# Patient Record
Sex: Female | Born: 1970 | Race: Black or African American | Hispanic: No | Smoking: Never smoker
Health system: Southern US, Community
[De-identification: ages and names within clinical notes are randomized; demographics above are authoritative.]

## PROBLEM LIST (undated history)

## (undated) DIAGNOSIS — N939 Abnormal uterine and vaginal bleeding, unspecified: Secondary | ICD-10-CM

## (undated) DIAGNOSIS — N920 Excessive and frequent menstruation with regular cycle: Secondary | ICD-10-CM

## (undated) DIAGNOSIS — T7840XA Allergy, unspecified, initial encounter: Secondary | ICD-10-CM

## (undated) DIAGNOSIS — I10 Essential (primary) hypertension: Secondary | ICD-10-CM

## (undated) DIAGNOSIS — F419 Anxiety disorder, unspecified: Secondary | ICD-10-CM

## (undated) HISTORY — DX: Anxiety disorder, unspecified: F41.9

## (undated) HISTORY — PX: TUBAL LIGATION: SHX77

## (undated) HISTORY — PX: BUNIONECTOMY: SHX129

## (undated) HISTORY — DX: Allergy, unspecified, initial encounter: T78.40XA

## (undated) HISTORY — PX: COSMETIC SURGERY: SHX468

---

## 2003-07-24 ENCOUNTER — Emergency Department (HOSPITAL_COMMUNITY): Admission: EM | Admit: 2003-07-24 | Discharge: 2003-07-24 | Payer: Self-pay | Admitting: Emergency Medicine

## 2003-11-14 ENCOUNTER — Encounter: Admission: RE | Admit: 2003-11-14 | Discharge: 2003-12-26 | Payer: Self-pay

## 2014-03-21 ENCOUNTER — Emergency Department (HOSPITAL_COMMUNITY): Payer: Medicaid Other

## 2014-03-21 ENCOUNTER — Emergency Department (HOSPITAL_COMMUNITY)
Admission: EM | Admit: 2014-03-21 | Discharge: 2014-03-21 | Disposition: A | Discharge status: Home / Self Care | Payer: Medicaid Other | Attending: Emergency Medicine | Admitting: Emergency Medicine

## 2014-03-21 ENCOUNTER — Encounter (HOSPITAL_COMMUNITY): Payer: Self-pay | Admitting: Emergency Medicine

## 2014-03-21 DIAGNOSIS — Y9289 Other specified places as the place of occurrence of the external cause: Secondary | ICD-10-CM | POA: Diagnosis not present

## 2014-03-21 DIAGNOSIS — Y9389 Activity, other specified: Secondary | ICD-10-CM | POA: Diagnosis not present

## 2014-03-21 DIAGNOSIS — W01198A Fall on same level from slipping, tripping and stumbling with subsequent striking against other object, initial encounter: Secondary | ICD-10-CM | POA: Insufficient documentation

## 2014-03-21 DIAGNOSIS — W108XXA Fall (on) (from) other stairs and steps, initial encounter: Secondary | ICD-10-CM | POA: Insufficient documentation

## 2014-03-21 DIAGNOSIS — Y998 Other external cause status: Secondary | ICD-10-CM | POA: Insufficient documentation

## 2014-03-21 DIAGNOSIS — I10 Essential (primary) hypertension: Secondary | ICD-10-CM | POA: Insufficient documentation

## 2014-03-21 DIAGNOSIS — Z79899 Other long term (current) drug therapy: Secondary | ICD-10-CM | POA: Insufficient documentation

## 2014-03-21 DIAGNOSIS — S8992XA Unspecified injury of left lower leg, initial encounter: Secondary | ICD-10-CM | POA: Diagnosis not present

## 2014-03-21 DIAGNOSIS — W19XXXA Unspecified fall, initial encounter: Secondary | ICD-10-CM

## 2014-03-21 DIAGNOSIS — M25562 Pain in left knee: Secondary | ICD-10-CM

## 2014-03-21 HISTORY — DX: Essential (primary) hypertension: I10

## 2014-03-21 MED ORDER — HYDROCODONE-ACETAMINOPHEN 5-325 MG PO TABS: 1.0000 | ORAL_TABLET | ORAL | Status: DC | PRN

## 2014-03-21 NOTE — Discharge Instructions (Signed)

## 2014-03-21 NOTE — ED Notes (Signed)
Pt stated that she fell and struck her l/knee one week ago. Pain has increased over last few days. Pt is ambulatory. Denies LOC, injury due to slip and fall

## 2014-03-21 NOTE — ED Provider Notes (Signed)
CSN: [id number]     Arrival date & time 03/21/14  1414 History  This chart was scribed for non-physician practitioner ,Everlene Farrier, PA-C,  working with Geoffery Lyons, MD, by Lionel December, ED Scribe. This patient was seen in room WTR8/WTR8 and the patient's care was started at 3:11 PM .   Chief Complaint  Patient presents with  . Knee Pain    fell and struck knee one week ago  . Fall     (Consider location/radiation/quality/duration/timing/severity/associated sxs/prior Treatment) The history is provided by the patient. No language interpreter was used.    HPI Comments: Cathy Cole is a 44 y.o. female who presents to the Emergency Department complaining of knee pain that occurred after slipping down two stairs and falling a week ago. She notes that the pain started two days after the fall and states that it feels swollen. Patient is ambulatory and  notes that the pain is worse with movement (8/10)  and is a 5/10 without movement.   She has tried heating pad, and tylenol.  She went to her doctor at [facility] and states that he gave her Tramadol (which did not alleviate her pain) and no xray was not taken. She would like an x-ray today. Denies LOC, numbness, tingling, previous knee injury or surgery, back pain, calve pain.    Past Medical History  Diagnosis Date  . Hypertension    Past Surgical History  Procedure Laterality Date  . Tubal ligation    . Bunionectomy     Family History  Problem Relation Age of Onset  . Diabetes Other   . Hypertension Other    History  Substance Use Topics  . Smoking status: Never Smoker   . Smokeless tobacco: Not on file  . Alcohol Use: No   OB History    No data available     Review of Systems  Constitutional: Negative for fever.  Eyes: Negative for pain.  Respiratory: Negative for cough and shortness of breath.   Cardiovascular: Negative for chest pain and leg swelling.  Musculoskeletal: Negative for myalgias, back  pain, joint swelling and neck stiffness.       Left knee pain  Neurological: Negative for syncope and numbness.  All other systems reviewed and are negative.     Allergies  Bactrim  Home Medications   Prior to Admission medications   Medication Sig Start Date End Date Taking? Authorizing Provider  hydrochlorothiazide (HYDRODIURIL) 25 MG tablet Take 25 mg by mouth daily.   Yes Historical Provider, MD  metoprolol tartrate (LOPRESSOR) 25 MG tablet Take 25 mg by mouth 2 (two) times daily.   Yes Historical Provider, MD  HYDROcodone-acetaminophen (NORCO/VICODIN) 5-325 MG per tablet Take 1 tablet by mouth every 4 (four) hours as needed for moderate pain or severe pain. 03/21/14   Einar Gip Taydon Nasworthy, PA-C   BP 132/79 mmHg  Pulse 88  Temp(Src) 98.5 F (36.9 C) (Oral)  Resp 16  SpO2 100%  LMP 02/26/2014 (Approximate) Physical Exam  Constitutional: She appears well-developed and well-nourished. No distress.  HENT:  Head: Normocephalic and atraumatic.  Mouth/Throat: Oropharynx is clear and moist. No oropharyngeal exudate.  Eyes: Pupils are equal, round, and reactive to light. Right eye exhibits no discharge. Left eye exhibits no discharge.  Cardiovascular: Normal rate, regular rhythm, normal heart sounds and intact distal pulses.  Exam reveals no gallop.   No murmur heard. Bilateral posterior tibialis pulses are intact.  Pulmonary/Chest: Effort normal. No respiratory distress.  Musculoskeletal: Normal  range of motion. She exhibits tenderness. She exhibits no edema.  Patient's left knee is tender to palpation on the anterior aspect. There is no knee edema noted. There is no knee instability noted. Patient is full range of motion of her knee. The patient's family in the room without difficulty or assistance. The patient has good strength in her bilateral lower extremities.  Neurological: She is alert. Coordination normal.  Skin: Skin is warm and dry. No rash noted. She is not diaphoretic.  No erythema. No pallor.  Psychiatric: She has a normal mood and affect. Her behavior is normal.  Nursing note and vitals reviewed.   ED Course  Procedures (including critical care time) DIAGNOSTIC STUDIES: Oxygen Saturation is 100% on RA, normal by my interpretation.    COORDINATION OF CARE: 3:17 PM Discussed treatment plan with patient at beside, the patient agrees with the plan and has no further questions at this time.  Labs Review Labs Reviewed - No data to display  Imaging Review Dg Knee Complete 4 Views Left  03/21/2014   CLINICAL DATA:  Larey SeatFell 8 days ago and injured knee.  Persistent pain.  EXAM: LEFT KNEE - COMPLETE 4+ VIEW  COMPARISON:  None.  FINDINGS: Mild tricompartmental degenerative changes for age with early joint space narrowing and minimal osteophytic spurring. No acute fracture or osteochondral lesion. There is a small to moderate joint effusion.  IMPRESSION: Mild age advanced degenerative changes.  No acute fracture.  Small to moderate joint effusion.   Electronically Signed   By: Loralie ChampagneMark  Gallerani M.D.   On: 03/21/2014 16:04     EKG Interpretation None      Filed Vitals:   03/21/14 1435 03/21/14 1635  BP: 132/79   Pulse: 88 80  Temp: 98.5 F (36.9 C)   TempSrc: Oral   Resp: 16   SpO2: 100% 100%     MDM   Meds given in ED:  Medications - No data to display  Discharge Medication List as of 03/21/2014  4:21 PM    START taking these medications   Details  HYDROcodone-acetaminophen (NORCO/VICODIN) 5-325 MG per tablet Take 1 tablet by mouth every 4 (four) hours as needed for moderate pain or severe pain., Starting 03/21/2014, Until Discontinued, Print        Final diagnoses:  Fall  Left knee pain   This is a 44 year old female who presents to the emergency department after a slip and fall injuring her left knee 1 week ago. The patient reports she had pain starting 5 days ago that is generalized to her knee. Patient has full range of motion of her left  knee. The patient is afebrile and nontoxic-appearing. The patient's knee x-ray is unremarkable. Patient is provided a knee sleeve and the ED. I advised the patient to follow-up with their primary care provider this week. I advised the patient to return to the emergency department with new or worsening symptoms or new concerns. The patient verbalized understanding and agreement with plan.    I personally performed the services described in this documentation, which was scribed in my presence. The recorded information has been reviewed and is accurate.  Lawana ChambersWilliam Duncan Saralynn Langhorst, PA-C 03/21/14 2244  Geoffery Lyonsouglas Delo, MD 03/22/14 [phone]

## 2014-07-01 ENCOUNTER — Encounter (HOSPITAL_COMMUNITY): Payer: Self-pay | Admitting: Emergency Medicine

## 2014-07-01 ENCOUNTER — Emergency Department (HOSPITAL_COMMUNITY): Payer: Medicaid Other

## 2014-07-01 ENCOUNTER — Emergency Department (HOSPITAL_COMMUNITY)
Admission: EM | Admit: 2014-07-01 | Discharge: 2014-07-01 | Disposition: A | Discharge status: Home / Self Care | Payer: Medicaid Other | Attending: Emergency Medicine | Admitting: Emergency Medicine

## 2014-07-01 DIAGNOSIS — Z79899 Other long term (current) drug therapy: Secondary | ICD-10-CM | POA: Insufficient documentation

## 2014-07-01 DIAGNOSIS — J111 Influenza due to unidentified influenza virus with other respiratory manifestations: Secondary | ICD-10-CM | POA: Diagnosis not present

## 2014-07-01 DIAGNOSIS — Z3202 Encounter for pregnancy test, result negative: Secondary | ICD-10-CM | POA: Insufficient documentation

## 2014-07-01 DIAGNOSIS — I10 Essential (primary) hypertension: Secondary | ICD-10-CM | POA: Insufficient documentation

## 2014-07-01 DIAGNOSIS — R69 Illness, unspecified: Secondary | ICD-10-CM

## 2014-07-01 DIAGNOSIS — R509 Fever, unspecified: Secondary | ICD-10-CM | POA: Diagnosis present

## 2014-07-01 LAB — WET PREP, GENITAL
CLUE CELLS WET PREP: NONE SEEN
TRICH WET PREP: NONE SEEN
WBC WET PREP: NONE SEEN
Yeast Wet Prep HPF POC: NONE SEEN

## 2014-07-01 LAB — URINALYSIS, ROUTINE W REFLEX MICROSCOPIC
Glucose, UA: NEGATIVE mg/dL
Ketones, ur: NEGATIVE mg/dL
Nitrite: NEGATIVE
Protein, ur: NEGATIVE mg/dL
Specific Gravity, Urine: 1.029 (ref 1.005–1.030)
Urobilinogen, UA: 0.2 mg/dL (ref 0.0–1.0)
pH: 6 (ref 5.0–8.0)

## 2014-07-01 LAB — URINE MICROSCOPIC-ADD ON

## 2014-07-01 LAB — PREGNANCY, URINE: Preg Test, Ur: NEGATIVE

## 2014-07-01 MED ORDER — IBUPROFEN 800 MG PO TABS
800.0000 mg | ORAL_TABLET | Freq: Once | ORAL | Status: AC
  Administered 2014-07-01: 800 mg via ORAL
  Filled 2014-07-01: qty 1

## 2014-07-01 NOTE — Discharge Instructions (Signed)
Viral Infections She presented hydrated, use Tylenol or Motrin as needed for aches and fevers. Follow-up with her doctor. Return to the ED if you develop new or worsening symptoms. A viral infection can be caused by different types of viruses.Most viral infections are not serious and resolve on their own. However, some infections may cause severe symptoms and may lead to further complications. SYMPTOMS Viruses can frequently cause:  Minor sore throat.  Aches and pains.  Headaches.  Runny nose.  Different types of rashes.  Watery eyes.  Tiredness.  Cough.  Loss of appetite.  Gastrointestinal infections, resulting in nausea, vomiting, and diarrhea. These symptoms do not respond to antibiotics because the infection is not caused by bacteria. However, you might catch a bacterial infection following the viral infection. This is sometimes called a "superinfection." Symptoms of such a bacterial infection may include:  Worsening sore throat with pus and difficulty swallowing.  Swollen neck glands.  Chills and a high or persistent fever.  Severe headache.  Tenderness over the sinuses.  Persistent overall ill feeling (malaise), muscle aches, and tiredness (fatigue).  Persistent cough.  Yellow, green, or brown mucus production with coughing. HOME CARE INSTRUCTIONS   Only take over-the-counter or prescription medicines for pain, discomfort, diarrhea, or fever as directed by your caregiver.  Drink enough water and fluids to keep your urine clear or pale yellow. Sports drinks can provide valuable electrolytes, sugars, and hydration.  Get plenty of rest and maintain proper nutrition. Soups and broths with crackers or rice are fine. SEEK IMMEDIATE MEDICAL CARE IF:   You have severe headaches, shortness of breath, chest pain, neck pain, or an unusual rash.  You have uncontrolled vomiting, diarrhea, or you are unable to keep down fluids.  You or your child has an oral  temperature above 102 F (38.9 C), not controlled by medicine.  Your baby is older than 3 months with a rectal temperature of 102 F (38.9 C) or higher.  Your baby is 113 months old or younger with a rectal temperature of 100.4 F (38 C) or higher. MAKE SURE YOU:   Understand these instructions.  Will watch your condition.  Will get help right away if you are not doing well or get worse. Document Released: 12/11/2004 Document Revised: 05/26/2011 Document Reviewed: 07/08/2010 [facility] Patient Information 2015 [place]. This information is not intended to replace advice given to you by your health care provider. Make sure you discuss any questions you have with your health care provider.

## 2014-07-01 NOTE — ED Notes (Signed)
Pt from home c/o cough, body aches, fever, and cloudy urine since yesterday.

## 2014-07-01 NOTE — ED Notes (Signed)
Pt is requesting a pelvic. Pt is concerned about discharge. Notified Dr. Manus Gunningancour

## 2014-07-01 NOTE — ED Notes (Signed)
Provided pt with Cranberry Juice for [facility]

## 2014-07-01 NOTE — ED Provider Notes (Signed)
CSN: [id number]     Arrival date & time 07/01/14  1141 History   First MD Initiated Contact with Patient 07/01/14 1207     Chief Complaint  Patient presents with  . Generalized Body Aches  . Fever  . Cough     (Consider location/radiation/quality/duration/timing/severity/associated sxs/prior Treatment) HPI Comments: Patient presents with 2 day history of body aches, cough, fever, back pain, dark urine, chills and fever to 101. 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 She did not receive a flu shot. She denies any shortness of breath. She has a nonproductive cough in her chest hurts when she coughs only. Denies any leg pain or leg swelling. No abdominal pain, nausea or vomiting. She denies any pain with urination but her urine has been dark. She denies any focal weakness, numbness or tingling. States compliance with her blood pressure medications.  Patient is a 44 y.o. female presenting with cough. The history is provided by the patient.  Cough Associated symptoms: fever and myalgias   Associated symptoms: no chest pain and no headaches     Past Medical History  Diagnosis Date  . Hypertension    Past Surgical History  Procedure Laterality Date  . Tubal ligation    . Bunionectomy     Family History  Problem Relation Age of Onset  . Diabetes Other   . Hypertension Other    History  Substance Use Topics  . Smoking status: Never Smoker   . Smokeless tobacco: Not on file  . Alcohol Use: No   OB History    No data available     Review of Systems  Constitutional: Positive for fever, activity change, appetite change and fatigue.  HENT: Positive for congestion. Negative for postnasal drip.   Eyes: Negative for visual disturbance.  Respiratory: Positive for cough.   Cardiovascular: Negative for chest pain.  Gastrointestinal: Negative for nausea, vomiting and abdominal pain.  Genitourinary: Negative for dysuria and hematuria.  Musculoskeletal: Positive for myalgias and  arthralgias.  Neurological: Positive for weakness. Negative for dizziness, light-headedness and headaches.  A complete 10 system review of systems was obtained and all systems are negative except as noted in the HPI and PMH.      Allergies  Bactrim  Home Medications   Prior to Admission medications   Medication Sig Start Date End Date Taking? Authorizing Provider  hydrochlorothiazide (MICROZIDE) 12.5 MG capsule Take 12.5 mg by mouth daily.   Yes Historical Provider, MD  metoprolol succinate (TOPROL-XL) 25 MG 24 hr tablet Take 25 mg by mouth daily.   Yes Historical Provider, MD  HYDROcodone-acetaminophen (NORCO/VICODIN) 5-325 MG per tablet Take 1 tablet by mouth every 4 (four) hours as needed for moderate pain or severe pain. 03/21/14   Everlene FarrierWilliam Dansie, PA-C   BP 116/83 mmHg  Pulse 92  Temp(Src) 99 F (37.2 C) (Oral)  Resp 18  SpO2 96%  LMP 06/15/2014 Physical Exam  Constitutional: She is oriented to person, place, and time. She appears well-developed and well-nourished. No distress.  HENT:  Head: Normocephalic and atraumatic.  Mouth/Throat: Oropharynx is clear and moist. No oropharyngeal exudate.  Eyes: Conjunctivae and EOM are normal. Pupils are equal, round, and reactive to light.  Neck: Normal range of motion. Neck supple.  No meningismus.  Cardiovascular: Normal rate, regular rhythm, normal heart sounds and intact distal pulses.   No murmur heard. Pulmonary/Chest: Effort normal and breath sounds normal. No respiratory distress. She exhibits no tenderness.  Abdominal: Soft. There is no tenderness. There  is no rebound and no guarding.  Genitourinary: Vaginal discharge found.  Chaperone present. White discharge in vaginal vault. No CMT. No adnexal tenderness.  Musculoskeletal: Normal range of motion. She exhibits tenderness. She exhibits no edema.  Paraspinal lumbar tenderness  Neurological: She is alert and oriented to person, place, and time. No cranial nerve deficit. She  exhibits normal muscle tone. Coordination normal.  No ataxia on finger to nose bilaterally. No pronator drift. 5/5 strength throughout. CN 2-12 intact. Negative Romberg. Equal grip strength. Sensation intact. Gait is normal.   Skin: Skin is warm.  Psychiatric: She has a normal mood and affect. Her behavior is normal.  Nursing note and vitals reviewed.   ED Course  Procedures (including critical care time) Labs Review Labs Reviewed  URINALYSIS, ROUTINE W REFLEX MICROSCOPIC - Abnormal; Notable for the following:    APPearance CLOUDY (*)    Hgb urine dipstick MODERATE (*)    Bilirubin Urine SMALL (*)    Leukocytes, UA TRACE (*)    All other components within normal limits  URINE MICROSCOPIC-ADD ON - Abnormal; Notable for the following:    Squamous Epithelial / LPF MANY (*)    Bacteria, UA FEW (*)    All other components within normal limits  WET PREP, GENITAL  PREGNANCY, URINE  GC/CHLAMYDIA PROBE AMP ([place])    Imaging Review Dg Chest 2 View  07/01/2014   CLINICAL DATA:  Generalized body aches, fever, cough, and cloudy urine x last p.m. Nonsmoker. Nondiabetic. H/o HTN.  EXAM: CHEST  2 VIEW  COMPARISON:  None.  FINDINGS: The heart size and mediastinal contours are within normal limits. Both lungs are clear. No pleural effusion or pneumothorax. The visualized skeletal structures are unremarkable.  IMPRESSION: No active cardiopulmonary disease.   Electronically Signed   By: Amie Portland M.D.   On: 07/01/2014 12:55     EKG Interpretation   Date/Time:  Saturday July 01 2014 12:30:14 EDT Ventricular Rate:  93 PR Interval:  161 QRS Duration: 75 QT Interval:  343 QTC Calculation: 427 R Axis:   37 Text Interpretation:  Sinus rhythm No previous ECGs available Confirmed by  Malyna Budney  MD, Kimra Kantor [phone]) on 07/01/2014 1:09:27 PM      MDM   Final diagnoses:  Influenza-like illness   2 days of body aches, fever, coughing, headache, chills with sick contacts at work.  Chest  x-ray negative. Urinalysis appears contaminated.  Patient states she's been having ongoing vaginal discharge for the past week and wants a pelvic exam. She denies any bleeding or pain. She was treated for a yeast infection by her doctor earlier this week.  Pelvic exam done as above.  Benign.  Supportive care for viral syndrome, possibly influenza. Follow-up with PCP this week. Return precautions discussed.  Glynn Octave, MD 07/01/14 [phone]

## 2014-07-03 LAB — GC/CHLAMYDIA PROBE AMP (~~LOC~~) NOT AT ARMC
Chlamydia: NEGATIVE
Neisseria Gonorrhea: NEGATIVE

## 2014-10-11 ENCOUNTER — Ambulatory Visit: Admit: 2014-10-11 | Payer: Self-pay | Admitting: Obstetrics and Gynecology

## 2014-10-11 SURGERY — NOVASURE ABLATION
Anesthesia: General

## 2014-10-25 ENCOUNTER — Ambulatory Visit: Payer: Medicaid Other | Attending: Internal Medicine

## 2014-11-01 ENCOUNTER — Encounter (HOSPITAL_COMMUNITY): Payer: Self-pay

## 2014-11-01 ENCOUNTER — Encounter (HOSPITAL_COMMUNITY)
Admission: RE | Admit: 2014-11-01 | Discharge: 2014-11-01 | Disposition: A | Discharge status: Home / Self Care | Payer: Medicaid Other | Source: Ambulatory Visit | Attending: Obstetrics and Gynecology | Admitting: Obstetrics and Gynecology

## 2014-11-01 DIAGNOSIS — Z882 Allergy status to sulfonamides status: Secondary | ICD-10-CM | POA: Diagnosis not present

## 2014-11-01 DIAGNOSIS — Z8249 Family history of ischemic heart disease and other diseases of the circulatory system: Secondary | ICD-10-CM | POA: Diagnosis not present

## 2014-11-01 DIAGNOSIS — I10 Essential (primary) hypertension: Secondary | ICD-10-CM | POA: Diagnosis not present

## 2014-11-01 DIAGNOSIS — Z01818 Encounter for other preprocedural examination: Secondary | ICD-10-CM | POA: Diagnosis present

## 2014-11-01 DIAGNOSIS — E669 Obesity, unspecified: Secondary | ICD-10-CM | POA: Diagnosis not present

## 2014-11-01 DIAGNOSIS — N939 Abnormal uterine and vaginal bleeding, unspecified: Secondary | ICD-10-CM | POA: Diagnosis not present

## 2014-11-01 DIAGNOSIS — N920 Excessive and frequent menstruation with regular cycle: Secondary | ICD-10-CM | POA: Diagnosis not present

## 2014-11-01 DIAGNOSIS — Z79899 Other long term (current) drug therapy: Secondary | ICD-10-CM | POA: Diagnosis not present

## 2014-11-01 DIAGNOSIS — Z833 Family history of diabetes mellitus: Secondary | ICD-10-CM | POA: Diagnosis not present

## 2014-11-01 LAB — CBC
HEMATOCRIT: 39.5 % (ref 36.0–46.0)
HEMOGLOBIN: 13.6 g/dL (ref 12.0–15.0)
MCH: 30.6 pg (ref 26.0–34.0)
MCHC: 34.4 g/dL (ref 30.0–36.0)
MCV: 88.8 fL (ref 78.0–100.0)
Platelets: 286 10*3/uL (ref 150–400)
RBC: 4.45 MIL/uL (ref 3.87–5.11)
RDW: 13.2 % (ref 11.5–15.5)
WBC: 10.1 10*3/uL (ref 4.0–10.5)

## 2014-11-01 LAB — BASIC METABOLIC PANEL
ANION GAP: 8 (ref 5–15)
BUN: 13 mg/dL (ref 6–20)
CALCIUM: 8.7 mg/dL — AB (ref 8.9–10.3)
CHLORIDE: 104 mmol/L (ref 101–111)
CO2: 24 mmol/L (ref 22–32)
Creatinine, Ser: 0.88 mg/dL (ref 0.44–1.00)
GFR calc non Af Amer: >60 mL/min (ref >60)
Glucose, Bld: 106 mg/dL — ABNORMAL HIGH (ref 65–99)
POTASSIUM: 4.9 mmol/L (ref 3.5–5.1)
Sodium: 136 mmol/L (ref 135–145)

## 2014-11-01 NOTE — Patient Instructions (Signed)
   Your procedure is scheduled on: AUG 24 AT 730AM  Enter through the Main Entrance of [facility] at: 6AM  Pick up the phone at the desk and dial [phone] and inform us of your arrival.  Please call this number if you have any problems the morning of surgery: [phone]  Remember: Do not eat food or drink liquids  after midnight: AUG 23  Take these medicines the morning of surgery with a SIP OF WATER: TAKE BOTH BLOOD PRESSURE MEDS AM OF SURGERY  Do not wear jewelry, make-up, or FINGER nail polish No metal in your hair or on your body. Do not wear lotions, powders, perfumes.  You may wear deodorant.  Do not bring valuables to the hospital. Contacts, dentures or bridgework may not be worn into surgery.     Patients discharged on the day of surgery will not be allowed to drive home.

## 2014-11-01 NOTE — Anesthesia Preprocedure Evaluation (Addendum)
Anesthesia Evaluation  Patient identified by MRN, date of birth, ID band Patient awake    Reviewed: Allergy & Precautions, NPO status , Patient's Chart, lab work & pertinent test results  Airway Mallampati: II   Neck ROM: Full    Dental  (+) Dental Advisory Given, Teeth Intact   Pulmonary neg pulmonary ROS,  breath sounds clear to auscultation        Cardiovascular hypertension, Pt. on medications Rhythm:Regular  EKG OK   Neuro/Psych negative neurological ROS     GI/Hepatic negative GI ROS, Neg liver ROS,   Endo/Other  negative endocrine ROS  Renal/GU negative Renal ROS     Musculoskeletal   Abdominal (+) - obese,   Peds  Hematology 13/39   Anesthesia Other Findings   Reproductive/Obstetrics                            Anesthesia Physical Anesthesia Plan  ASA: II  Anesthesia Plan: General   Post-op Pain Management:    Induction: Intravenous  Airway Management Planned: LMA  Additional Equipment:   Intra-op Plan:   Post-operative Plan:   Informed Consent: I have reviewed the patients History and Physical, chart, labs and discussed the procedure including the risks, benefits and alternatives for the proposed anesthesia with the patient or authorized representative who has indicated his/her understanding and acceptance.     Plan Discussed with:   Anesthesia Plan Comments:         Anesthesia Quick Evaluation

## 2014-11-07 ENCOUNTER — Encounter (HOSPITAL_COMMUNITY): Payer: Self-pay | Admitting: Obstetrics and Gynecology

## 2014-11-07 DIAGNOSIS — N939 Abnormal uterine and vaginal bleeding, unspecified: Secondary | ICD-10-CM | POA: Diagnosis present

## 2014-11-07 DIAGNOSIS — N920 Excessive and frequent menstruation with regular cycle: Secondary | ICD-10-CM

## 2014-11-07 HISTORY — DX: Excessive and frequent menstruation with regular cycle: N92.0

## 2014-11-07 HISTORY — DX: Abnormal uterine and vaginal bleeding, unspecified: N93.9

## 2014-11-07 NOTE — H&P (Signed)
Cathy Cole is an 44 y.o. female [phone] with AUB, menorrhagia.  For novasure endometrial ablation.  Had negative SIUS and [place].  US reveals possible adenomyosis.  D/W pt r/b/a and process of ablation     Past Medical History  Diagnosis Date  . Hypertension   . Abnormal uterine bleeding (AUB) 11/07/2014  . Menorrhagia 11/07/2014    Past Surgical History  Procedure Laterality Date  . Tubal ligation    . Bunionectomy      Family History  Problem Relation Age of Onset  . Diabetes Other   . Hypertension Other     Social History:  reports that she has never smoked. She does not have any smokeless tobacco history on file. She reports that she does not drink alcohol. Her drug history is not on file.single  Allergies:  Allergies  Allergen Reactions  . Bactrim [Sulfamethoxazole-Trimethoprim] Anaphylaxis    Chest pain    Meds HCTZ, metoprolol/ lorazepam, Vit D   POBGyn Hx [id number] SVD x 2, TAB, fetal demise, SVD No abn pap, 4/15, last Contra BTL No STDS  Review of Systems  Constitutional: Negative.   HENT: Negative.   Eyes: Negative.   Respiratory: Negative.   Cardiovascular: Negative.   Gastrointestinal: Negative.   Genitourinary: Negative.   Musculoskeletal: Negative.   Skin: Negative.   Neurological: Negative.   Psychiatric/Behavioral: Negative.     There were no vitals taken for this visit. Physical Exam  Constitutional: She is oriented to person, place, and time. She appears well-developed and well-nourished.  HENT:  Head: Normocephalic and atraumatic.  Cardiovascular: Normal rate and regular rhythm.   Respiratory: Effort normal and breath sounds normal. No respiratory distress. She has no wheezes.  GI: Soft. Bowel sounds are normal. She exhibits no distension. There is no tenderness.  Musculoskeletal: Normal range of motion.  Neurological: She is alert and oriented to person, place, and time.  Skin: Skin is warm and dry.  Psychiatric: She has a normal mood  and affect. Her behavior is normal.    No results found for this or any previous visit (from the past 24 hour(s)).  No results found.  Assessment/Plan: 44yo for Novasure endometrial ablation Ablation to control bleeding D/w pt r/b/a   Bovard-Stuckert, Jacqulene Huntley 11/07/2014, 10:11 PM

## 2014-11-08 ENCOUNTER — Ambulatory Visit (HOSPITAL_COMMUNITY): Payer: Medicaid Other | Admitting: Anesthesiology

## 2014-11-08 ENCOUNTER — Ambulatory Visit (HOSPITAL_COMMUNITY)
Admission: RE | Admit: 2014-11-08 | Discharge: 2014-11-08 | Disposition: A | Discharge status: Home / Self Care | Payer: Medicaid Other | Source: Ambulatory Visit | Attending: Obstetrics and Gynecology | Admitting: Obstetrics and Gynecology

## 2014-11-08 ENCOUNTER — Encounter (HOSPITAL_COMMUNITY)
Admission: RE | Disposition: A | Discharge status: Home / Self Care | Payer: Self-pay | Source: Ambulatory Visit | Attending: Obstetrics and Gynecology

## 2014-11-08 DIAGNOSIS — N939 Abnormal uterine and vaginal bleeding, unspecified: Secondary | ICD-10-CM | POA: Diagnosis not present

## 2014-11-08 DIAGNOSIS — Z833 Family history of diabetes mellitus: Secondary | ICD-10-CM | POA: Insufficient documentation

## 2014-11-08 DIAGNOSIS — N920 Excessive and frequent menstruation with regular cycle: Secondary | ICD-10-CM | POA: Insufficient documentation

## 2014-11-08 DIAGNOSIS — Z79899 Other long term (current) drug therapy: Secondary | ICD-10-CM | POA: Insufficient documentation

## 2014-11-08 DIAGNOSIS — E669 Obesity, unspecified: Secondary | ICD-10-CM | POA: Insufficient documentation

## 2014-11-08 DIAGNOSIS — Z882 Allergy status to sulfonamides status: Secondary | ICD-10-CM | POA: Insufficient documentation

## 2014-11-08 DIAGNOSIS — I10 Essential (primary) hypertension: Secondary | ICD-10-CM | POA: Insufficient documentation

## 2014-11-08 DIAGNOSIS — Z8249 Family history of ischemic heart disease and other diseases of the circulatory system: Secondary | ICD-10-CM | POA: Insufficient documentation

## 2014-11-08 HISTORY — DX: Abnormal uterine and vaginal bleeding, unspecified: N93.9

## 2014-11-08 HISTORY — PX: NOVASURE ABLATION: SHX5394

## 2014-11-08 HISTORY — DX: Excessive and frequent menstruation with regular cycle: N92.0

## 2014-11-08 LAB — PREGNANCY, URINE: PREG TEST UR: NEGATIVE

## 2014-11-08 SURGERY — NOVASURE ABLATION
Anesthesia: General

## 2014-11-08 MED ORDER — LIDOCAINE HCL 1 % IJ SOLN
INTRAMUSCULAR | Status: AC
  Filled 2014-11-08: qty 20

## 2014-11-08 MED ORDER — FENTANYL CITRATE (PF) 250 MCG/5ML IJ SOLN
INTRAMUSCULAR | Status: AC
  Filled 2014-11-08: qty 25

## 2014-11-08 MED ORDER — PROMETHAZINE HCL 25 MG/ML IJ SOLN
6.2500 mg | INTRAMUSCULAR | Status: DC | PRN
Start: 2014-11-08 — End: 2014-11-08

## 2014-11-08 MED ORDER — MEPERIDINE HCL 25 MG/ML IJ SOLN
6.2500 mg | INTRAMUSCULAR | Status: DC | PRN
  Administered 2014-11-08: 12.5 mg via INTRAVENOUS

## 2014-11-08 MED ORDER — MEPERIDINE HCL 25 MG/ML IJ SOLN: 6.2500 mg | INTRAMUSCULAR | Status: DC | PRN

## 2014-11-08 MED ORDER — ACETAMINOPHEN 500 MG PO TABS: 1000.0000 mg | ORAL_TABLET | Freq: Four times a day (QID) | ORAL | Status: DC | PRN

## 2014-11-08 MED ORDER — MIDAZOLAM HCL 2 MG/2ML IJ SOLN
INTRAMUSCULAR | Status: AC
  Filled 2014-11-08: qty 4

## 2014-11-08 MED ORDER — PROPOFOL 10 MG/ML IV BOLUS
INTRAVENOUS | Status: AC
Start: 2014-11-08 — End: 2014-11-08
  Filled 2014-11-08: qty 20

## 2014-11-08 MED ORDER — HYDROCODONE-ACETAMINOPHEN 5-325 MG PO TABS: 1.0000 | ORAL_TABLET | Freq: Four times a day (QID) | ORAL | Status: DC | PRN

## 2014-11-08 MED ORDER — PROMETHAZINE HCL 25 MG/ML IJ SOLN: 6.2500 mg | INTRAMUSCULAR | Status: DC | PRN

## 2014-11-08 MED ORDER — FENTANYL CITRATE (PF) 100 MCG/2ML IJ SOLN
INTRAMUSCULAR | Status: DC
Start: 2014-11-08 — End: 2014-11-08
  Filled 2014-11-08: qty 2

## 2014-11-08 MED ORDER — LIDOCAINE HCL (CARDIAC) 20 MG/ML IV SOLN
INTRAVENOUS | Status: AC
Start: 2014-11-08 — End: 2014-11-08
  Filled 2014-11-08: qty 5

## 2014-11-08 MED ORDER — MEPERIDINE HCL 25 MG/ML IJ SOLN
INTRAMUSCULAR | Status: AC
  Filled 2014-11-08: qty 1

## 2014-11-08 MED ORDER — PHENYLEPHRINE HCL 10 MG/ML IJ SOLN
INTRAMUSCULAR | Status: DC | PRN
  Administered 2014-11-08 (×2): 80 ug via INTRAVENOUS

## 2014-11-08 MED ORDER — SCOPOLAMINE 1 MG/3DAYS TD PT72
MEDICATED_PATCH | TRANSDERMAL | Status: AC
  Administered 2014-11-08: 1.5 mg via TRANSDERMAL
  Filled 2014-11-08: qty 1

## 2014-11-08 MED ORDER — PROPOFOL 10 MG/ML IV BOLUS
INTRAVENOUS | Status: DC | PRN
  Administered 2014-11-08: 180 mg via INTRAVENOUS

## 2014-11-08 MED ORDER — DEXAMETHASONE SODIUM PHOSPHATE 10 MG/ML IJ SOLN
INTRAMUSCULAR | Status: DC | PRN
  Administered 2014-11-08: 4 mg via INTRAVENOUS

## 2014-11-08 MED ORDER — ONDANSETRON HCL 4 MG/2ML IJ SOLN
INTRAMUSCULAR | Status: DC | PRN
  Administered 2014-11-08: 4 mg via INTRAVENOUS

## 2014-11-08 MED ORDER — LACTATED RINGERS IV SOLN: INTRAVENOUS | Status: DC

## 2014-11-08 MED ORDER — LIDOCAINE HCL (PF) 1 % IJ SOLN
INTRAMUSCULAR | Status: DC | PRN
  Administered 2014-11-08: 10 mL

## 2014-11-08 MED ORDER — LIDOCAINE HCL (CARDIAC) 20 MG/ML IV SOLN
INTRAVENOUS | Status: DC | PRN
  Administered 2014-11-08: 100 mg via INTRAVENOUS

## 2014-11-08 MED ORDER — FENTANYL CITRATE (PF) 100 MCG/2ML IJ SOLN
INTRAMUSCULAR | Status: DC | PRN
  Administered 2014-11-08: 100 ug via INTRAVENOUS

## 2014-11-08 MED ORDER — LACTATED RINGERS IV SOLN
INTRAVENOUS | Status: DC
  Administered 2014-11-08: 06:00:00 via INTRAVENOUS

## 2014-11-08 MED ORDER — SCOPOLAMINE 1 MG/3DAYS TD PT72
1.0000 | MEDICATED_PATCH | Freq: Once | TRANSDERMAL | Status: DC
  Administered 2014-11-08: 1.5 mg via TRANSDERMAL

## 2014-11-08 MED ORDER — DEXAMETHASONE SODIUM PHOSPHATE 4 MG/ML IJ SOLN
INTRAMUSCULAR | Status: AC
  Filled 2014-11-08: qty 1

## 2014-11-08 MED ORDER — MIDAZOLAM HCL 2 MG/2ML IJ SOLN
INTRAMUSCULAR | Status: DC | PRN
  Administered 2014-11-08: 2 mg via INTRAVENOUS

## 2014-11-08 MED ORDER — ONDANSETRON HCL 4 MG/2ML IJ SOLN
INTRAMUSCULAR | Status: AC
  Filled 2014-11-08: qty 2

## 2014-11-08 MED ORDER — FENTANYL CITRATE (PF) 100 MCG/2ML IJ SOLN
25.0000 ug | INTRAMUSCULAR | Status: DC | PRN
  Administered 2014-11-08: 50 ug via INTRAVENOUS

## 2014-11-08 MED ORDER — PHENYLEPHRINE HCL 10 MG/ML IJ SOLN
INTRAMUSCULAR | Status: AC
Start: 2014-11-08 — End: 2014-11-08
  Filled 2014-11-08: qty 1

## 2014-11-08 SURGICAL SUPPLY — 8 items
ABLATOR ENDOMETRIAL BIPOLAR (ABLATOR) ×2 IMPLANT
CATH ROBINSON RED A/P 16FR (CATHETERS) ×2 IMPLANT
CLOTH BEACON ORANGE TIMEOUT ST (SAFETY) ×2 IMPLANT
GLOVE BIO SURGEON STRL SZ 6.5 (GLOVE) ×2 IMPLANT
GOWN STRL REUS W/TWL LRG LVL3 (GOWN DISPOSABLE) ×4 IMPLANT
PACK VAGINAL MINOR WOMEN LF (CUSTOM PROCEDURE TRAY) ×2 IMPLANT
TOWEL OR 17X24 6PK STRL BLUE (TOWEL DISPOSABLE) ×4 IMPLANT
WATER STERILE IRR 1000ML POUR (IV SOLUTION) ×2 IMPLANT

## 2014-11-08 NOTE — Interval H&P Note (Signed)
History and Physical Interval Note:  11/08/2014 7:06 AM  Cathy Cole  has presented today for surgery, with the diagnosis of menorrhagia  The various methods of treatment have been discussed with the patient and family. After consideration of risks, benefits and other options for treatment, the patient has consented to  Procedure(s): NOVASURE ABLATION (N/A) as a surgical intervention .  The patient's history has been reviewed, patient examined, no change in status, stable for surgery.  I have reviewed the patient's chart and labs.  Questions were answered to the patient's satisfaction.     Bovard-Stuckert, Rosibel Giacobbe

## 2014-11-08 NOTE — Anesthesia Procedure Notes (Signed)
Procedure Name: LMA Insertion Date/Time: 11/08/2014 7:24 AM Performed by: Cephus Shelling A Pre-anesthesia Checklist: Patient being monitored, Patient identified, Emergency Drugs available and Suction available Patient Re-evaluated:Patient Re-evaluated prior to inductionOxygen Delivery Method: Circle system utilized Preoxygenation: Pre-oxygenation with 100% oxygen Intubation Type: IV induction and Inhalational induction Ventilation: Mask ventilation without difficulty LMA: LMA inserted LMA Size: 4.0 Number of attempts: 1 Dental Injury: Teeth and Oropharynx as per pre-operative assessment

## 2014-11-08 NOTE — Anesthesia Postprocedure Evaluation (Signed)
  Anesthesia Post-op Note  Patient: Cathy Cole  Procedure(s) Performed: Procedure(s): NOVASURE ABLATION (N/A)  Patient Location: PACU  Anesthesia Type:General  Level of Consciousness: awake  Airway and Oxygen Therapy: Patient Spontanous Breathing  Post-op Pain: mild  Post-op Assessment: Post-op Vital signs reviewed, Patient's Cardiovascular Status Stable, Respiratory Function Stable, Patent Airway and No signs of Nausea or vomiting              Post-op Vital Signs: Reviewed and stable  Last Vitals:  Filed Vitals:   11/08/14 0805  BP: 129/82  Pulse: 80  Temp: 36.3 C  Resp: 18    Complications: No apparent anesthesia complications

## 2014-11-08 NOTE — Discharge Instructions (Signed)

## 2014-11-08 NOTE — Brief Op Note (Signed)
11/08/2014  7:58 AM  PATIENT:  Cathy Cole  44 y.o. female  PRE-OPERATIVE DIAGNOSIS:  menorrhagia  POST-OPERATIVE DIAGNOSIS:  menorrhagia  PROCEDURE:  Procedure(s): NOVASURE ABLATION (N/A)  SURGEON:  Surgeon(s) and Role:    * Seanpatrick Maisano Bovard-Stuckert, MD - Primary  ANESTHESIA:   general by LMA  EBL:  Total I/O In: 1000 [I.V.:1000] Out: 200 [Blood:200]  BLOOD ADMINISTERED:none  DRAINS: none   LOCAL MEDICATIONS USED:  LIDOCAINE  and Amount: 10 ml  SPECIMEN:  No Specimen  DISPOSITION OF SPECIMEN:  N/A  COUNTS:  YES  TOURNIQUET:  * No tourniquets in log *  DICTATION: .Other Dictation: Dictation Number [id number]  PLAN OF CARE: Discharge to home after PACU  PATIENT DISPOSITION:  PACU - hemodynamically stable.   Delay start of Pharmacological VTE agent (>24hrs) due to surgical blood loss or risk of bleeding: not applicable

## 2014-11-08 NOTE — Op Note (Signed)
NAMEJOSHALYN, Cole NO.:  [id number]  MEDICAL RECORD NO.:  [id number]  LOCATION:  [facility]                          FACILITY:  [facility]  PHYSICIAN:  Sherron Monday, MD        DATE OF BIRTH:  [birth year]  DATE OF PROCEDURE:  11/08/2014 DATE OF DISCHARGE:                              OPERATIVE REPORT   PREOPERATIVE DIAGNOSIS:  Abnormal uterine bleeding, menorrhagia.  POSTOPERATIVE DIAGNOSIS:  Abnormal uterine bleeding, menorrhagia.  PROCEDURE:  NovaSure endometrial ablation.  SURGEON:  Sherron Monday, MD  ANESTHESIA:  General by LMA.  EBL:  Approximately 200 mL.  IV FLUIDS:  1000 mL paracervical block with 10 mL of 1% lidocaine.  The patient in the office had an evaluation of her uterus with a saline infused ultrasound revealing no polyps and an endometrial biopsy that was benign.  She was brought to the OR for the NovaSure endometrial ablation.  She was placed in the [place] stirrups, prepped and draped in the normal sterile fashion.  After appropriate time-out had been performed, cervix was dilated to 25 to accommodate the NovaSure device. After the paracervical block had been placed at 1, 5, and 7, the anterior lip of her cervix was grasped with a single-tooth tenaculum. Her cervix sounded to 8 cm.  Cervical length was found to be 4 cm giving cavity length of 4.  The device was placed in the uterus and seated and the cavity width was 3.5 cm.  The machine calculated power of 77 watts. After cavity assessment was performed, an ablation was performed for 1 minute 55 seconds.  The device was then removed as well as the tenaculum.  Patient tolerated the procedure well.  Sponge, lap, and needle counts were correct x2 per the operating staff.  Patient was returned in supine position, awakened in stable condition and transferred to the PACU.     Sherron Monday, MD     JB/MEDQ  D:  11/08/2014  T:  11/08/2014  Job:  [id number]

## 2014-11-08 NOTE — Transfer of Care (Signed)
Immediate Anesthesia Transfer of Care Note  Patient: Cathy Cole  Procedure(s) Performed: Procedure(s): NOVASURE ABLATION (N/A)  Patient Location: PACU  Anesthesia Type:General  Level of Consciousness: awake  Airway & Oxygen Therapy: Patient Spontanous Breathing  Post-op Assessment: Report given to PACU RN  Post vital signs: stable  Filed Vitals:   11/08/14 0618  BP: 112/76  Pulse: 84  Temp: 36.9 C  Resp: 18    Complications: No apparent anesthesia complications

## 2014-11-09 ENCOUNTER — Encounter (HOSPITAL_COMMUNITY): Payer: Self-pay | Admitting: Obstetrics and Gynecology

## 2015-08-29 ENCOUNTER — Other Ambulatory Visit: Payer: Self-pay | Admitting: Gynecology

## 2015-08-29 DIAGNOSIS — R928 Other abnormal and inconclusive findings on diagnostic imaging of breast: Secondary | ICD-10-CM

## 2015-09-06 ENCOUNTER — Ambulatory Visit
Admission: RE | Admit: 2015-09-06 | Discharge: 2015-09-06 | Disposition: A | Discharge status: Home / Self Care | Payer: Managed Care, Other (non HMO) | Source: Ambulatory Visit | Attending: Gynecology | Admitting: Gynecology

## 2015-09-06 DIAGNOSIS — R928 Other abnormal and inconclusive findings on diagnostic imaging of breast: Secondary | ICD-10-CM

## 2016-03-03 IMAGING — CR DG CHEST 2V
2 series · 2 of 2 positions shown · non-contrast
Comparison: None.

CLINICAL DATA: Generalized body aches, fever, cough, and cloudy
urine x last p.m. Nonsmoker. Nondiabetic. H/o HTN.

EXAM:
CHEST  2 VIEW

[w chest pa]
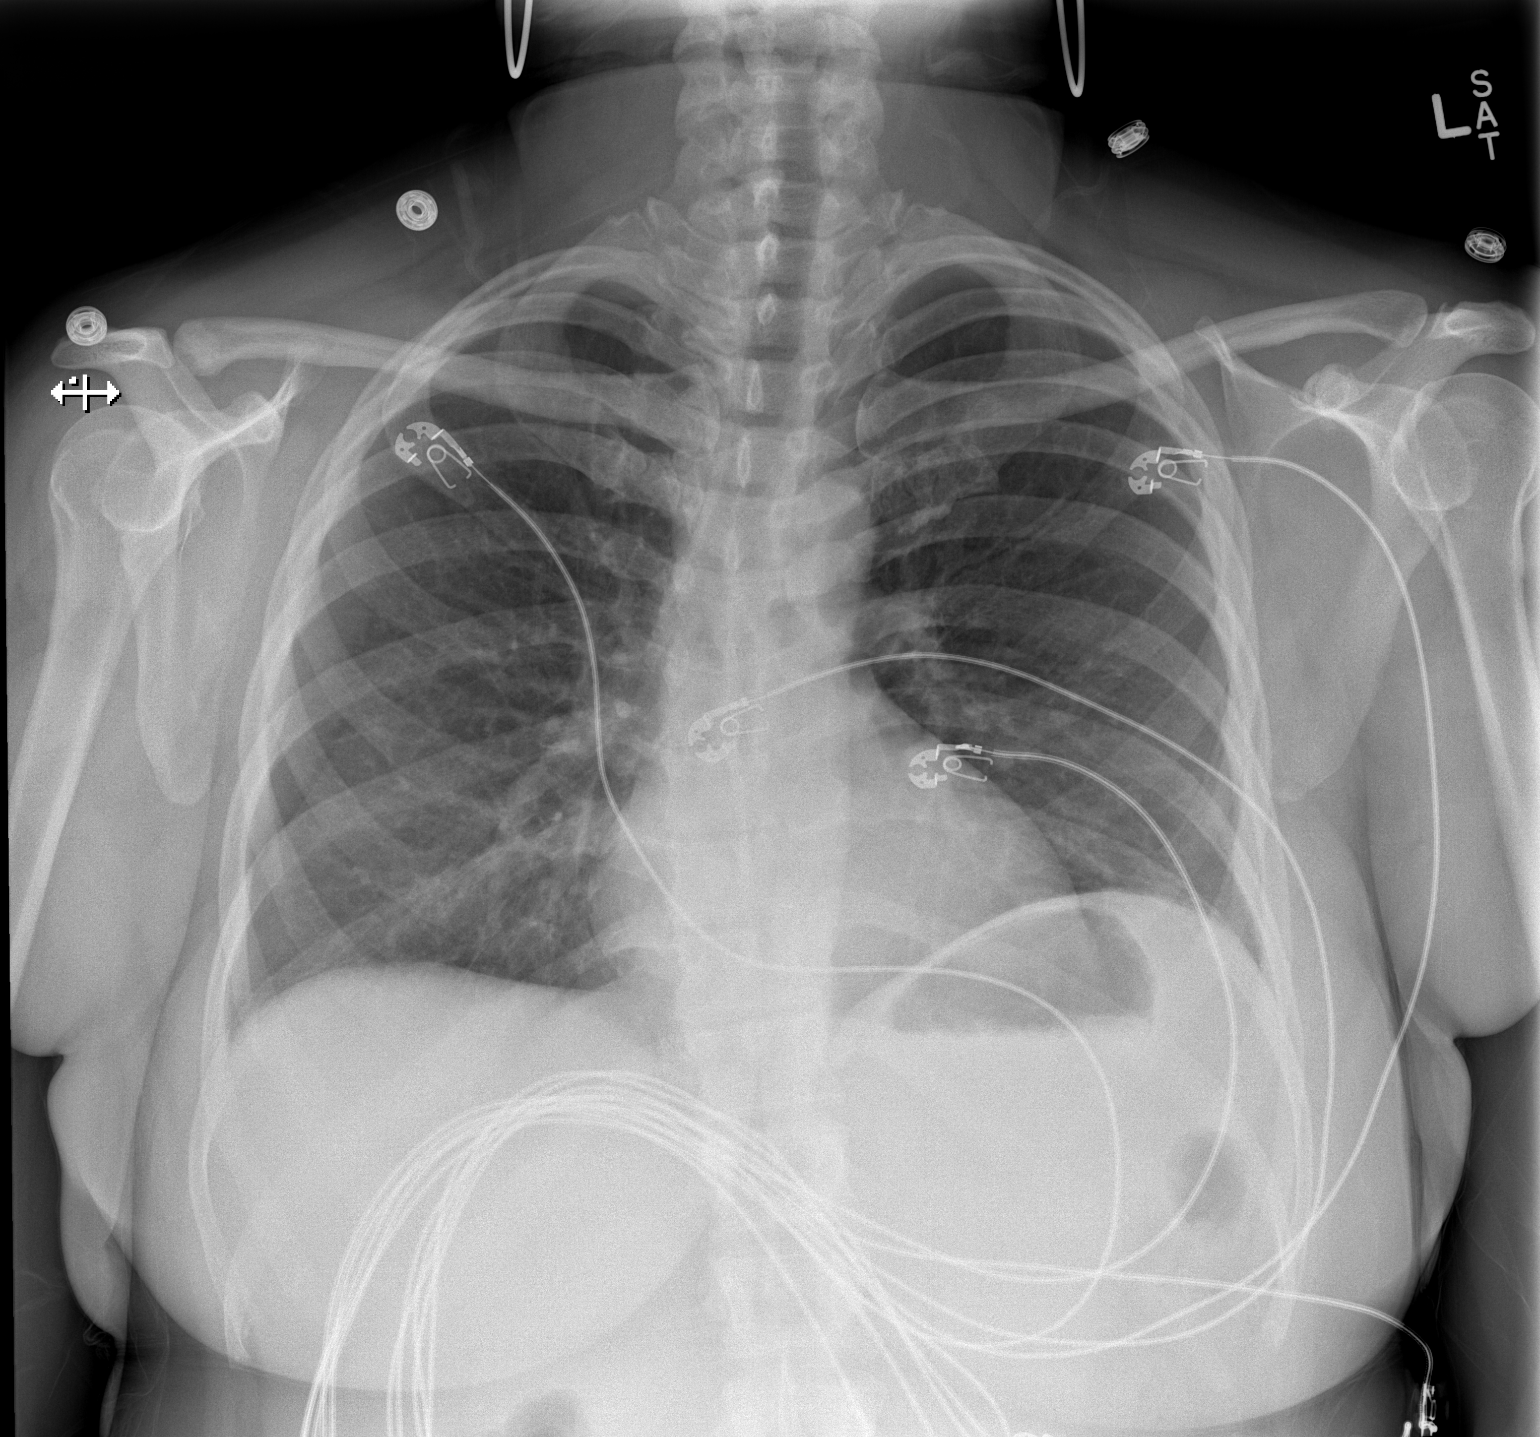

[w chest lat]
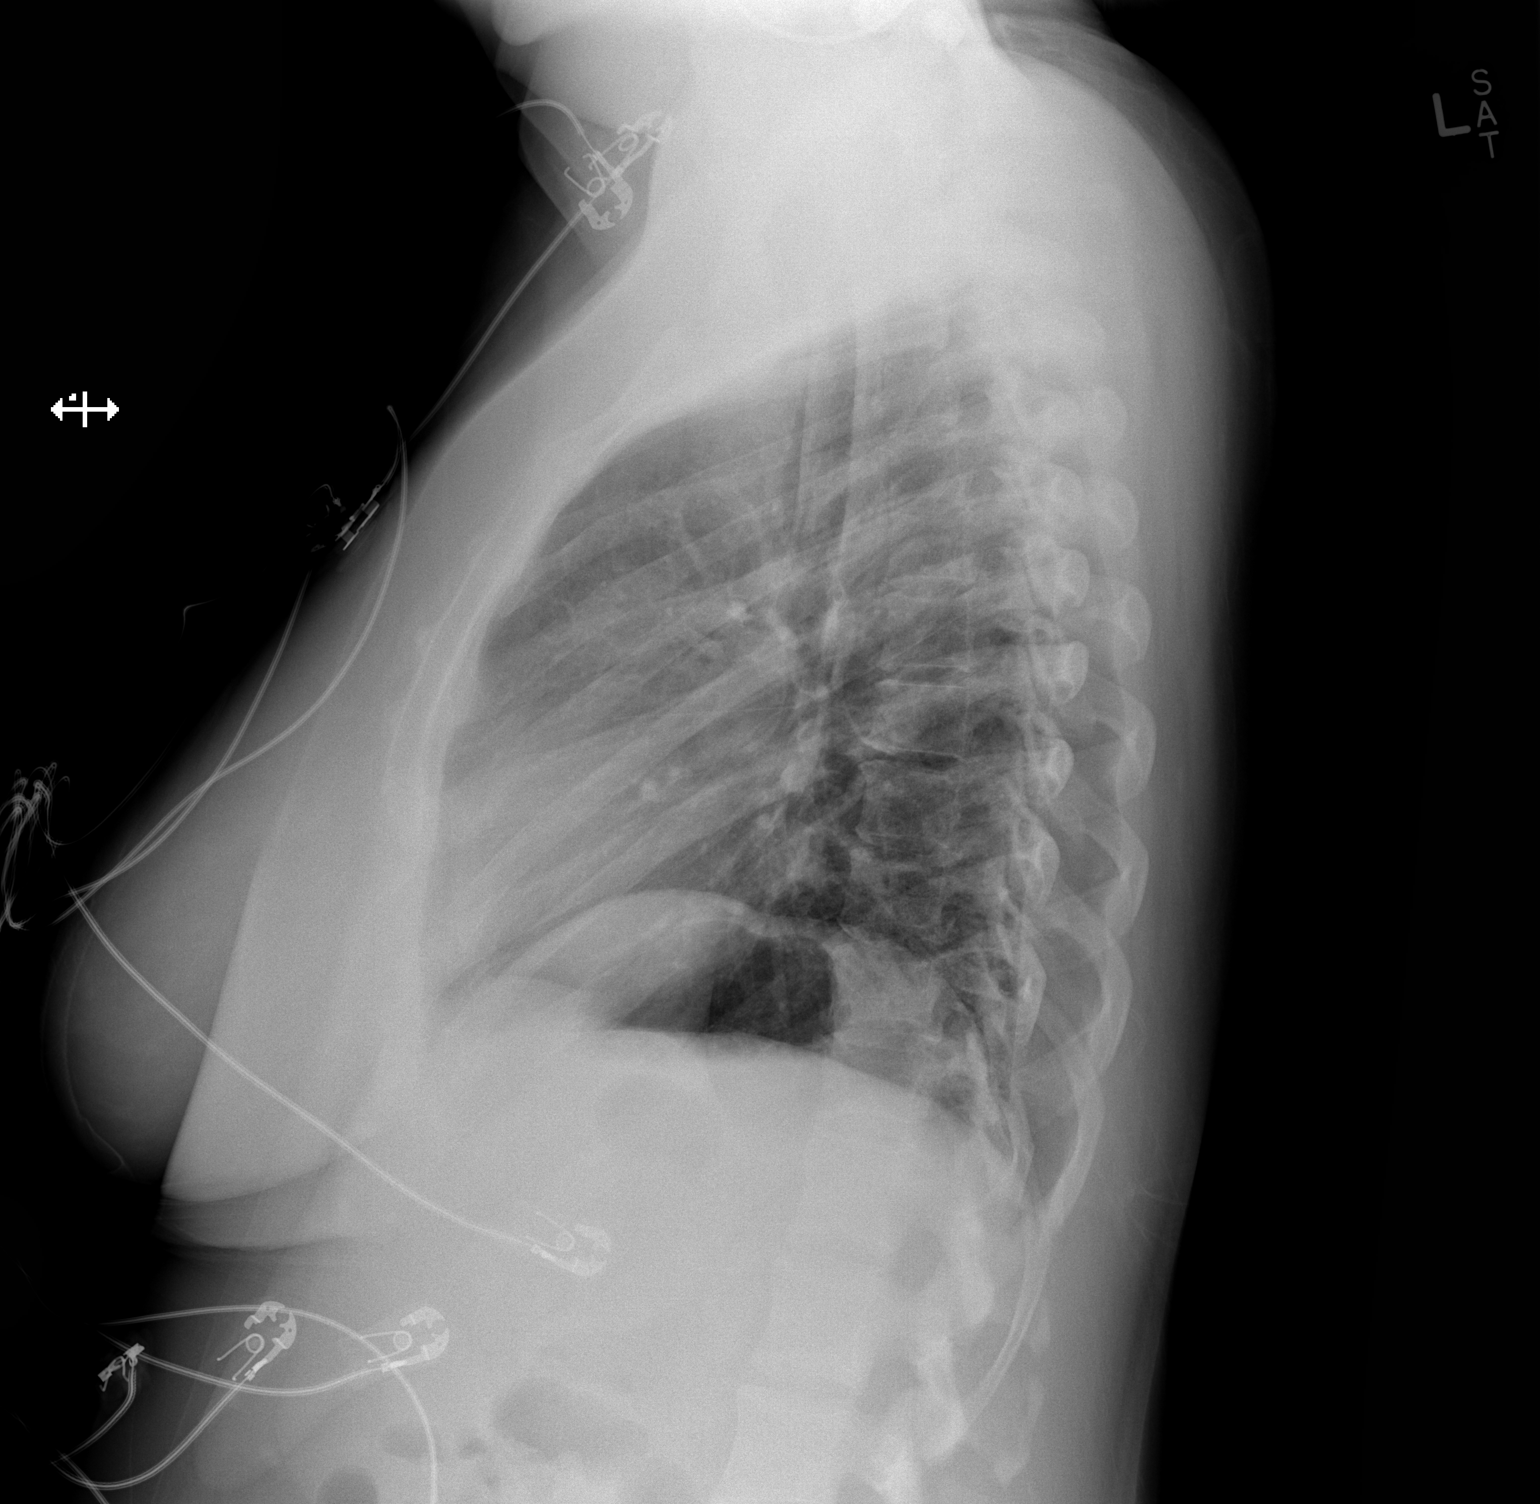

[2 of 2 positions shown; findings below may reference images not displayed]

FINDINGS: The heart size and mediastinal contours are within normal limits.
Both lungs are clear. No pleural effusion or pneumothorax. The
visualized skeletal structures are unremarkable.
IMPRESSION: No active cardiopulmonary disease.

## 2016-07-02 ENCOUNTER — Other Ambulatory Visit: Payer: Self-pay | Admitting: Otolaryngology

## 2016-07-02 DIAGNOSIS — H9190 Unspecified hearing loss, unspecified ear: Secondary | ICD-10-CM

## 2016-07-02 DIAGNOSIS — H7190 Unspecified cholesteatoma, unspecified ear: Secondary | ICD-10-CM

## 2016-07-07 ENCOUNTER — Ambulatory Visit
Admission: RE | Admit: 2016-07-07 | Discharge: 2016-07-07 | Disposition: A | Discharge status: Home / Self Care | Payer: Managed Care, Other (non HMO) | Source: Ambulatory Visit | Attending: Otolaryngology | Admitting: Otolaryngology

## 2016-07-07 DIAGNOSIS — H9190 Unspecified hearing loss, unspecified ear: Secondary | ICD-10-CM

## 2016-07-07 DIAGNOSIS — H7190 Unspecified cholesteatoma, unspecified ear: Secondary | ICD-10-CM

## 2016-08-13 ENCOUNTER — Ambulatory Visit (INDEPENDENT_AMBULATORY_CARE_PROVIDER_SITE_OTHER): Payer: Managed Care, Other (non HMO) | Admitting: Physician Assistant

## 2016-08-13 ENCOUNTER — Encounter: Payer: Self-pay | Admitting: Physician Assistant

## 2016-08-13 VITALS — BP 138/85 | HR 83 | Temp 98.2°F | Resp 18 | Ht 64.0 in | Wt 183.0 lb

## 2016-08-13 DIAGNOSIS — Z202 Contact with and (suspected) exposure to infections with a predominantly sexual mode of transmission: Secondary | ICD-10-CM

## 2016-08-13 DIAGNOSIS — N926 Irregular menstruation, unspecified: Secondary | ICD-10-CM

## 2016-08-13 DIAGNOSIS — N898 Other specified noninflammatory disorders of vagina: Secondary | ICD-10-CM | POA: Diagnosis not present

## 2016-08-13 DIAGNOSIS — B9689 Other specified bacterial agents as the cause of diseases classified elsewhere: Secondary | ICD-10-CM | POA: Diagnosis not present

## 2016-08-13 DIAGNOSIS — N760 Acute vaginitis: Secondary | ICD-10-CM | POA: Diagnosis not present

## 2016-08-13 LAB — POCT WET + KOH PREP
Trich by wet prep: ABSENT
Yeast by KOH: ABSENT
Yeast by wet prep: ABSENT

## 2016-08-13 LAB — POCT URINE PREGNANCY: Preg Test, Ur: NEGATIVE

## 2016-08-13 MED ORDER — METRONIDAZOLE 0.75 % VA GEL: 1.0000 | Freq: Every day | VAGINAL | 0 refills | Status: AC

## 2016-08-13 NOTE — Patient Instructions (Addendum)
Bacterial Vaginosis Bacterial vaginosis is a vaginal infection that occurs when the normal balance of bacteria in the vagina is disrupted. It results from an overgrowth of certain bacteria. This is the most common vaginal infection among women ages 32-44. Because bacterial vaginosis increases your risk for STIs (sexually transmitted infections), getting treated can help reduce your risk for chlamydia, gonorrhea, herpes, and HIV (human immunodeficiency virus). Treatment is also important for preventing complications in pregnant women, because this condition can cause an early (premature) delivery. What are the causes? This condition is caused by an increase in harmful bacteria that are normally present in small amounts in the vagina. However, the reason that the condition develops is not fully understood. What increases the risk? The following factors may make you more likely to develop this condition:  Having a new sexual partner or multiple sexual partners.  Having unprotected sex.  Douching.  Having an intrauterine device (IUD).  Smoking.  Drug and alcohol abuse.  Taking certain antibiotic medicines.  Being pregnant. You cannot get bacterial vaginosis from toilet seats, bedding, swimming pools, or contact with objects around you. What are the signs or symptoms? Symptoms of this condition include:  Grey or white vaginal discharge. The discharge can also be watery or foamy.  A fish-like odor with discharge, especially after sexual intercourse or during menstruation.  Itching in and around the vagina.  Burning or pain with urination. Some women with bacterial vaginosis have no signs or symptoms. How is this diagnosed? This condition is diagnosed based on:  Your medical history.  A physical exam of the vagina.  Testing a sample of vaginal fluid under a microscope to look for a large amount of bad bacteria or abnormal cells. Your health care provider may use a cotton swab or a  small wooden spatula to collect the sample. How is this treated? This condition is treated with antibiotics. These may be given as a pill, a vaginal cream, or a medicine that is put into the vagina (suppository). If the condition comes back after treatment, a second round of antibiotics may be needed. Follow these instructions at home: Medicines   Take over-the-counter and prescription medicines only as told by your health care provider.  Take or use your antibiotic as told by your health care provider. Do not stop taking or using the antibiotic even if you start to feel better. General instructions   If you have a female sexual partner, tell her that you have a vaginal infection. She should see her health care provider and be treated if she has symptoms. If you have a female sexual partner, he does not need treatment.  During treatment:  Avoid sexual activity until you finish treatment.  Do not douche.  Avoid alcohol as directed by your health care provider.  Avoid breastfeeding as directed by your health care provider.  Drink enough water and fluids to keep your urine clear or pale yellow.  Keep the area around your vagina and rectum clean.  Wash the area daily with warm water.  Wipe yourself from front to back after using the toilet.  Keep all follow-up visits as told by your health care provider. This is important. How is this prevented?  Do not douche.  Wash the outside of your vagina with warm water only.  Use protection when having sex. This includes latex condoms and dental dams.  Limit how many sexual partners you have. To help prevent bacterial vaginosis, it is best to have sex with just one  partner (monogamous).  Make sure you and your sexual partner are tested for STIs.  Wear cotton or cotton-lined underwear.  Avoid wearing tight pants and pantyhose, especially during summer.  Limit the amount of alcohol that you drink.  Do not use any products that contain  nicotine or tobacco, such as cigarettes and e-cigarettes. If you need help quitting, ask your health care provider.  Do not use illegal drugs. Where to find more information:  Centers for Disease Control and Prevention: AppraiserFraud.fi  American Sexual Health Association (ASHA): www.ashastd.org  U.S. Department of Health and Financial controller, Office on Women's Health: DustingSprays.pl or SecuritiesCard.it Contact a health care provider if:  Your symptoms do not improve, even after treatment.  You have more discharge or pain when urinating.  You have a fever.  You have pain in your abdomen.  You have pain during sex.  You have vaginal bleeding between periods. Summary  Bacterial vaginosis is a vaginal infection that occurs when the normal balance of bacteria in the vagina is disrupted.  Because bacterial vaginosis increases your risk for STIs (sexually transmitted infections), getting treated can help reduce your risk for chlamydia, gonorrhea, herpes, and HIV (human immunodeficiency virus). Treatment is also important for preventing complications in pregnant women, because the condition can cause an early (premature) delivery.  This condition is treated with antibiotic medicines. These may be given as a pill, a vaginal cream, or a medicine that is put into the vagina (suppository). This information is not intended to replace advice given to you by your health care provider. Make sure you discuss any questions you have with your health care provider. Document Released: 03/03/2005 Document Revised: 11/17/2015 Document Reviewed: 11/17/2015 Elsevier Interactive Patient Education  2017 [place] you for coming in today. I hope you feel we met your needs.  Feel free to call [facility] if you have any questions or further requests.  Please consider signing up for MyChart if you do not already have it, as this is a great way to  communicate with me.  Best,  Whitney McVey, PA-C   IF you received an x-ray today, you will receive an invoice from [facility] Rad[facility]. Please contact [facility]iology at [phone] with questions or concerns regarding your invoice.   IF you received labwork today, you will receive an invoice from [facility]. Please contact [facility] at [phone] with questions or concerns regarding your invoice.   Our billing staff will not be able to assist you with questions regarding bills from these companies.  You will be contacted with the lab results as soon as they are available. The fastest way to get your results is to activate your My Chart account. Instructions are located on the last page of this paperwork. If you have not heard from [place] regarding the results in 2 weeks, please contact this office.

## 2016-08-13 NOTE — Progress Notes (Signed)
Per pt request, I called [facility] [address] spoke to pharmacist and had them to transferr Rx for Metrogel to [facility] [address] location.

## 2016-08-13 NOTE — Progress Notes (Signed)
Cathy DoseLatania N Cole  MRN: [id number] DOB: [birth year]  PCP: [facility]  Subjective:  Pt is a 46 year old female PMH HTN who presents to clinic for vaginal discharge. She was treated for trichomonas and a yeast infection with Flagyl suppositories about a week ago.  Today she c/o continued itching and white/milky discharge.  LMP > 1 month ago.  She had protected sex with her partner, however the condom broke.  Denies fever, chills, abdominal pain, back pain, burning with urination, increased urinary frequency.   Review of Systems  Constitutional: Negative for chills, fatigue and fever.  Respiratory: Negative for cough, shortness of breath and wheezing.   Cardiovascular: Negative for chest pain and palpitations.  Gastrointestinal: Negative for abdominal pain, diarrhea, nausea and vomiting.  Genitourinary: Positive for vaginal discharge. Negative for decreased urine volume, difficulty urinating, dysuria, enuresis, flank pain, frequency, hematuria and urgency.  Musculoskeletal: Negative for back pain.  Neurological: Negative for dizziness, weakness, light-headedness and headaches.    Patient Active Problem List   Diagnosis Date Noted  . Abnormal uterine bleeding (AUB) 11/07/2014  . Menorrhagia 11/07/2014    Current Outpatient Prescriptions on File Prior to Visit  Medication Sig Dispense Refill  . hydrochlorothiazide (MICROZIDE) 12.5 MG capsule Take 12.5 mg by mouth daily.    . metoprolol succinate (TOPROL-XL) 25 MG 24 hr tablet Take 25 mg by mouth daily.    . Cholecalciferol (VITAMIN D3) 2000 UNITS TABS Take 1 tablet by mouth daily.    Marland Kitchen. HYDROcodone-acetaminophen (NORCO/VICODIN) 5-325 MG per tablet Take 1-2 tablets by mouth every 6 (six) hours as needed for moderate pain or severe pain. (Patient not taking: Reported on 08/13/2016) 20 tablet 0  . LORazepam (ATIVAN) 0.5 MG tablet Take 0.5 mg by mouth every 8 (eight) hours as needed for anxiety.     No current  facility-administered medications on file prior to visit.     Allergies  Allergen Reactions  . Bactrim [Sulfamethoxazole-Trimethoprim] Anaphylaxis    Chest pain     Objective:  BP 138/85   Pulse 83   Temp 98.2 F (36.8 C) (Oral)   Resp 18   Ht 5\' 4"  (1.626 m)   Wt 183 lb (83 kg)   LMP 07/10/2016   SpO2 100%   BMI 31.41 kg/m   Physical Exam  Constitutional: She is oriented to person, place, and time and well-developed, well-nourished, and in no distress. No distress.  Cardiovascular: Normal rate, regular rhythm and normal heart sounds.   Genitourinary: Cervix normal, right adnexa normal, left adnexa normal and vulva normal. Watery  thin  white and vaginal discharge found.  Neurological: She is alert and oriented to person, place, and time. GCS score is 15.  Skin: Skin is warm and dry.  Psychiatric: Mood, memory, affect and judgment normal.  Vitals reviewed.   Results for orders placed or performed in visit on 08/13/16  POCT Wet + KOH Prep  Result Value Ref Range   Yeast by KOH Absent Absent   Yeast by wet prep Absent Absent   WBC by wet prep None (A) Few   Clue Cells Wet Prep HPF POC Many (A) None   Trich by wet prep Absent Absent   Bacteria Wet Prep HPF POC Many (A) Few   Epithelial Cells By Principal FinancialWet Pref ([facility]) None None, Few, Too numerous to count   RBC,UR,HPF,POC None None RBC/hpf  POCT urine pregnancy  Result Value Ref Range   Preg Test, Ur Negative Negative  Assessment and Plan :  1. BV (bacterial vaginosis) 2. Vaginal discharge - metroNIDAZOLE (METROGEL) 0.75 % vaginal gel; Place 1 Applicatorful vaginally at bedtime.  Dispense: 70 g; Refill: 0 - POCT Wet + KOH Prep - Positive clue cells. Will treat with Metrogel. RTC if no improvement of symptoms.   3. Possible exposure to STD - GC/Chlamydia Probe Amp - HIV antibody - Hepatitis C antibody - Labs are pending. Will contact with results.   4. Missed period - POCT urine pregnancy - Negative.   Marco Collie, PA-C  Primary Care at [facility] 08/13/2016 1:57 PM

## 2016-08-14 LAB — GC/CHLAMYDIA PROBE AMP
Chlamydia trachomatis, NAA: NEGATIVE
Neisseria gonorrhoeae by PCR: NEGATIVE

## 2016-08-14 LAB — HEPATITIS C ANTIBODY: Hep C Virus Ab: 0.1 {s_co_ratio} (ref 0.0–0.9)

## 2016-08-14 LAB — HIV ANTIBODY (ROUTINE TESTING W REFLEX): HIV Screen 4th Generation wRfx: NONREACTIVE

## 2016-08-15 NOTE — Progress Notes (Signed)
Please call pt and let her know she is negative for gonorrhea, chlamydia, hepatitis C, and HIV.  Please come back if her symptoms have not improved. Thank you!!!!

## 2016-09-22 ENCOUNTER — Encounter: Payer: Self-pay | Admitting: Physician Assistant

## 2016-09-22 ENCOUNTER — Ambulatory Visit (INDEPENDENT_AMBULATORY_CARE_PROVIDER_SITE_OTHER): Payer: Managed Care, Other (non HMO) | Admitting: Physician Assistant

## 2016-09-22 VITALS — BP 130/84 | HR 75 | Temp 98.7°F | Resp 16 | Ht 64.0 in | Wt 182.0 lb

## 2016-09-22 DIAGNOSIS — B9689 Other specified bacterial agents as the cause of diseases classified elsewhere: Secondary | ICD-10-CM | POA: Diagnosis not present

## 2016-09-22 DIAGNOSIS — N898 Other specified noninflammatory disorders of vagina: Secondary | ICD-10-CM

## 2016-09-22 DIAGNOSIS — N760 Acute vaginitis: Secondary | ICD-10-CM | POA: Diagnosis not present

## 2016-09-22 LAB — POCT WET + KOH PREP
Trich by wet prep: ABSENT
Yeast by KOH: ABSENT
Yeast by wet prep: ABSENT

## 2016-09-22 MED ORDER — CLINDAMYCIN HCL 300 MG PO CAPS: 300.0000 mg | ORAL_CAPSULE | Freq: Two times a day (BID) | ORAL | 0 refills | Status: AC

## 2016-09-22 NOTE — Progress Notes (Signed)
Cathy Cole  MRN: [id number] DOB: 1970-12-15  PCP: [facility]  Subjective:  Pt is a 46 year old female who presents to clinic for vaginal odor and discharge x 1 week.  She was here about a month ago for the same c/c. Treated for BV with metrogel. She reports bad side effects with Flagyl, she chose metrogel.  Today she c/o vaginal odor and discharge that has not cleared since her last OV. Denies burning, itching, increased urinary frequency or urge, burning with urination, abdominal pain, back pain, flank pain.   Review of Systems  Constitutional: Negative for chills, diaphoresis and fever.  Gastrointestinal: Negative for abdominal pain.  Genitourinary: Positive for vaginal discharge. Negative for dysuria, flank pain, frequency, menstrual problem, urgency, vaginal bleeding and vaginal pain.  Musculoskeletal: Negative for back pain.    Patient Active Problem List   Diagnosis Date Noted  . Abnormal uterine bleeding (AUB) 11/07/2014  . Menorrhagia 11/07/2014    Current Outpatient Prescriptions on File Prior to Visit  Medication Sig Dispense Refill  . hydrochlorothiazide (MICROZIDE) 12.5 MG capsule Take 12.5 mg by mouth daily.    . metoprolol succinate (TOPROL-XL) 25 MG 24 hr tablet Take 25 mg by mouth daily.    . Cholecalciferol (VITAMIN D3) 2000 UNITS TABS Take 1 tablet by mouth daily.    Marland Kitchen. HYDROcodone-acetaminophen (NORCO/VICODIN) 5-325 MG per tablet Take 1-2 tablets by mouth every 6 (six) hours as needed for moderate pain or severe pain. (Patient not taking: Reported on 08/13/2016) 20 tablet 0  . LORazepam (ATIVAN) 0.5 MG tablet Take 0.5 mg by mouth every 8 (eight) hours as needed for anxiety.     No current facility-administered medications on file prior to visit.     Allergies  Allergen Reactions  . Bactrim [Sulfamethoxazole-Trimethoprim] Anaphylaxis    Chest pain     Objective:  BP 130/84   Pulse 75   Temp 98.7 F (37.1 C) (Oral)   Resp 16    Ht 5\' 4"  (1.626 m)   Wt 182 lb (82.6 kg)   LMP 06/23/2016   SpO2 100%   BMI 31.24 kg/m   Physical Exam  Constitutional: She is oriented to person, place, and time and well-developed, well-nourished, and in no distress. No distress.  Cardiovascular: Normal rate, regular rhythm and normal heart sounds.   Neurological: She is alert and oriented to person, place, and time. GCS score is 15.  Skin: Skin is warm and dry.  Psychiatric: Mood, memory, affect and judgment normal.  Vitals reviewed.  Results for orders placed or performed in visit on 09/22/16  POCT Wet + KOH Prep  Result Value Ref Range   Yeast by KOH Absent Absent   Yeast by wet prep Absent Absent   WBC by wet prep None (A) Few   Clue Cells Wet Prep HPF POC Moderate (A) None   Trich by wet prep Absent Absent   Bacteria Wet Prep HPF POC Many (A) Few   Epithelial Cells By Principal FinancialWet Pref ([facility]) None None, Few, Too numerous to count   RBC,UR,HPF,POC Few (A) None RBC/hpf    Assessment and Plan :  1. BV (bacterial vaginosis) 2. Vaginal odor - clindamycin (CLEOCIN) 300 MG capsule; Take 1 capsule (300 mg total) by mouth 2 (two) times daily.  Dispense: 14 capsule; Refill: 0 - POCT Wet + KOH Prep - Suspect symptoms are due to infection that was not entirely cleared. Will treat. RTC if no improvement.   Whitney Kaileena Obi,  PA-C  Primary Care at [facility] 09/22/2016 3:38 PM

## 2016-09-22 NOTE — Patient Instructions (Addendum)
You are positive for BV. Start taking oral Clindamycin 300 mg twice daily for seven. Call or come back if you are not better in 7-10 days.   Thank you for coming in today. I hope you feel we met your needs.  Feel free to call [facility] if you have any questions or further requests.  Please consider signing up for MyChart if you do not already have it, as this is a great way to communicate with me.  Best,  Whitney McVey, PA-C  Bacterial Vaginosis Bacterial vaginosis is an infection of the vagina. It happens when too many germs (bacteria) grow in the vagina. This infection puts you at risk for infections from sex (STIs). Treating this infection can lower your risk for some STIs. You should also treat this if you are pregnant. It can cause your baby to be born early. Follow these instructions at home: Medicines  Take over-the-counter and prescription medicines only as told by your doctor.  Take or use your antibiotic medicine as told by your doctor. Do not stop taking or using it even if you start to feel better. General instructions  If you your sexual partner is a woman, tell her that you have this infection. She needs to get treatment if she has symptoms. If you have a female partner, he does not need to be treated.  During treatment: ? Avoid sex. ? Do not douche. ? Avoid alcohol as told. ? Avoid breastfeeding as told.  Drink enough fluid to keep your pee (urine) clear or pale yellow.  Keep your vagina and butt (rectum) clean. ? Wash the area with warm water every day. ? Wipe from front to back after you use the toilet.  Keep all follow-up visits as told by your doctor. This is important. Preventing this condition  Do not douche.  Use only warm water to wash around your vagina.  Use protection when you have sex. This includes: ? Latex condoms. ? Dental dams.  Limit how many people you have sex with. It is best to only have sex with the same person (be monogamous).  Get tested  for STIs. Have your partner get tested.  Wear underwear that is cotton or lined with cotton.  Avoid tight pants and pantyhose. This is most important in summer.  Do not use any products that have nicotine or tobacco in them. These include cigarettes and e-cigarettes. If you need help quitting, ask your doctor.  Do not use illegal drugs.  Limit how much alcohol you drink. Contact a doctor if:  Your symptoms do not get better, even after you are treated.  You have more discharge or pain when you pee (urinate).  You have a fever.  You have pain in your belly (abdomen).  You have pain with sex.  Your bleed from your vagina between periods. Summary  This infection happens when too many germs (bacteria) grow in the vagina.  Treating this condition can lower your risk for some infections from sex (STIs).  You should also treat this if you are pregnant. It can cause early (premature) birth.  Do not stop taking or using your antibiotic medicine even if you start to feel better. This information is not intended to replace advice given to you by your health care provider. Make sure you discuss any questions you have with your health care provider. Document Released: 12/11/2007 Document Revised: 11/17/2015 Document Reviewed: 11/17/2015 Elsevier Interactive Patient Education  2017 [employer].  IF you received an x-ray today,  you will receive an invoice from [facility] Radiology. Please contact [facility] Radiology at [phone] with questions or concerns regarding your invoice.   IF you received labwork today, you will receive an invoice from [facility]. Please contact [facility] at [phone] with questions or concerns regarding your invoice.   Our billing staff will not be able to assist you with questions regarding bills from these companies.  You will be contacted with the lab results as soon as they are available. The fastest way to get your results is to activate your My Chart  account. Instructions are located on the last page of this paperwork. If you have not heard from [place] regarding the results in 2 weeks, please contact this office.

## 2017-01-02 ENCOUNTER — Encounter: Payer: Self-pay | Admitting: Physician Assistant

## 2017-01-02 ENCOUNTER — Ambulatory Visit (INDEPENDENT_AMBULATORY_CARE_PROVIDER_SITE_OTHER): Payer: Managed Care, Other (non HMO) | Admitting: Physician Assistant

## 2017-01-02 VITALS — BP 146/88 | HR 88 | Temp 98.9°F | Resp 16 | Ht 64.0 in | Wt 183.0 lb

## 2017-01-02 DIAGNOSIS — N898 Other specified noninflammatory disorders of vagina: Secondary | ICD-10-CM

## 2017-01-02 DIAGNOSIS — Z1329 Encounter for screening for other suspected endocrine disorder: Secondary | ICD-10-CM

## 2017-01-02 DIAGNOSIS — Z01419 Encounter for gynecological examination (general) (routine) without abnormal findings: Secondary | ICD-10-CM | POA: Diagnosis not present

## 2017-01-02 DIAGNOSIS — Z202 Contact with and (suspected) exposure to infections with a predominantly sexual mode of transmission: Secondary | ICD-10-CM

## 2017-01-02 DIAGNOSIS — N760 Acute vaginitis: Secondary | ICD-10-CM

## 2017-01-02 DIAGNOSIS — B9689 Other specified bacterial agents as the cause of diseases classified elsewhere: Secondary | ICD-10-CM

## 2017-01-02 DIAGNOSIS — Z0000 Encounter for general adult medical examination without abnormal findings: Secondary | ICD-10-CM

## 2017-01-02 DIAGNOSIS — Z130 Encounter for screening for diseases of the blood and blood-forming organs and certain disorders involving the immune mechanism: Secondary | ICD-10-CM

## 2017-01-02 DIAGNOSIS — E559 Vitamin D deficiency, unspecified: Secondary | ICD-10-CM | POA: Diagnosis not present

## 2017-01-02 DIAGNOSIS — Z13220 Encounter for screening for lipoid disorders: Secondary | ICD-10-CM | POA: Diagnosis not present

## 2017-01-02 LAB — POCT URINALYSIS DIP (MANUAL ENTRY)
Bilirubin, UA: NEGATIVE
Glucose, UA: NEGATIVE mg/dL
Ketones, POC UA: NEGATIVE mg/dL
Leukocytes, UA: NEGATIVE
Nitrite, UA: NEGATIVE
Protein Ur, POC: NEGATIVE mg/dL
Spec Grav, UA: 1.015 (ref 1.010–1.025)
Urobilinogen, UA: 1 E.U./dL
pH, UA: 7.5 (ref 5.0–8.0)

## 2017-01-02 LAB — POCT WET + KOH PREP
Trich by wet prep: ABSENT
Yeast by KOH: ABSENT
Yeast by wet prep: ABSENT

## 2017-01-02 MED ORDER — CLINDAMYCIN HCL 300 MG PO CAPS: 300.0000 mg | ORAL_CAPSULE | Freq: Two times a day (BID) | ORAL | 0 refills | Status: AC

## 2017-01-02 MED ORDER — METRONIDAZOLE 500 MG PO TABS: 500.0000 mg | ORAL_TABLET | Freq: Two times a day (BID) | ORAL | 0 refills | Status: DC

## 2017-01-02 NOTE — Patient Instructions (Addendum)
You are positive for bacterial vaginosis. Start taking Flagyl. DO NOT DRINK ALCOHOL while taking this medication.  Come back when convenient for a LAB ONLY visit. Please be fasting.  You need your next PAP on or after 04/2017 Make an appt to see dentist. Call your insurance to see what office is covered.  See below for lifestyle modifications and health maintenance schedule.   Try to shop mostly along the perimeter of the grocery store. Cut down consumption of processed foods.   The following foods are the foundation of a heart-healthy diet: Vegetables such as greens (spinach, collard greens, kale), broccoli, cabbage, carrots, bell peppers; stay away from starchy vegetables like potatoes, carrots, peas Fruits such as avocados, apples, berries, bananas, oranges, pears, grapes, and prunes  Whole grains such as plain oatmeal, brown rice, and whole-grain bread or tortillas  Fat-free or low-fat dairy foods such as milk, cheese, or yogurt  Protein-rich foods:  Fish high in omega-3 fatty acids, such as salmon, tuna, and trout, about 8 ounces a week  Lean meats such as 95 percent lean ground beef or pork tenderloin  Poultry such as skinless chicken or [place]  Eggs  Nuts, seeds, and soy products: quinoa, chia seeds Legumes such as kidney beans, lentils, chickpeas, black-eyed peas, and lima beans Oils and foods containing high levels of monounsaturated and polyunsaturated fats that can help lower blood cholesterol levels and the risk of cardiovascular disease. Some sources of these oils are:  Canola, corn, olive, safflower, sesame, sunflower, and soybean oils  Nuts such as walnuts, almonds, and pine nuts  Nut and seed butters  Salmon and trout  Seeds such as sesame, sunflower, pumpkin, or flax  Avocados  Tofu  Brussel sprouts - Cut off stems. Place in a mixing bowl that has a lid. Pour in a 1/4-1/2 cup olive oil, spices, use a light amount of parmesan. Place on a baking sheet. Bake for 10 minutes  at 400F. Take it out, eat the brussel chips. Place for another 5-10 minutes.   Mashed cauliflower - Boil a bunch of cauliflower in a pot of water. Blend in a food processor with 1-2 tablespoons of butter.  Spaghetti squash -  Cut the squash in half very carefully, clean out seeds from the middle. Place 1/2 face down in a microwave safe dish with at least 2 inches of water. Make 4-6 slits on outside of spaghetti squash and microwave for 10-12 minutes. Take out the spaghetti using a metal spoon. Repeat for the other half.   Vega protein is good protein powder, make sure you use ~6 ice cubes to give it smoothie consistency together with ~4-6 ounces of vanilla soy milk. Throw cinnamon into your shake, use peanut butter. You can also use the fruits listed above. Throw spinach or kale into the shake.   Recipe ideas: [employer], [employer], [employer], and [employer], wholefoodsmarket.com  Limit added sugars When you follow a heart-healthy eating plan, you should limit the amount of calories you consume each day from added sugars. Because added sugars do not provide essential nutrients and are extra calories, limiting them can help you choose nutrient-rich foods and stay within your daily calorie limit. Some foods, such as fruit, contain natural sugars. Added sugars do not occur naturally in foods, but instead are used to sweeten foods and drinks. Some examples of added sugars include brown sugar, corn syrup, dextrose, fructose, glucose, high-fructose corn syrup, raw sugar, and sucrose. In the [place], sweetened drinks,  snacks, and sweets are the major sources of added sugars. Sweetened drinks account for about half of all added sugars consumed. The following are examples of foods and drinks with added sugars. Sweetened drinks include soft drinks or sodas, fruit drinks, sweetened coffee and tea, energy drinks, alcoholic drinks, and favored waters.  Snacks and sweets include  grain-based desserts such as cakes, pies, cookies, brownies, doughnuts; dairy desserts such as ice cream, frozen desserts, and pudding; candies; sugars; jams; syrups; and sweet toppings. To help you reduce the amount of added sugars in your diet: Choose unsweetened or whole fruits for snacks or dessert.  Choose drinks without added sugar such as water, low-fat or fat-free milk, or 100 percent fruit or vegetable juice.  Limit intake of sweetened drinks, snacks and desserts by eating them less often and in smaller amounts.  If you drink alcohol, you should limit your intake. Men should have no more than two alcoholic drinks per day. Women should have no more than one alcoholic drink per day. One drink is: 12 ounces of regular beer (5 percent alcohol)  5 ounces of wine (12 percent alcohol)  1 ounces of 80-proof liquor (40 percent alcohol)  Health Maintenance, Female Adopting a healthy lifestyle and getting preventive care can go a long way to promote health and wellness. Talk with your health care provider about what schedule of regular examinations is right for you. This is a good chance for you to check in with your provider about disease prevention and staying healthy. In between checkups, there are plenty of things you can do on your own. Experts have done a lot of research about which lifestyle changes and preventive measures are most likely to keep you healthy. Ask your health care provider for more information. Weight and diet Eat a healthy diet  Be sure to include plenty of vegetables, fruits, low-fat dairy products, and lean protein.  Do not eat a lot of foods high in solid fats, added sugars, or salt.  Get regular exercise. This is one of the most important things you can do for your health. ? Most adults should exercise for at least 150 minutes each week. The exercise should increase your heart rate and make you sweat (moderate-intensity exercise). ? Most adults should also do  strengthening exercises at least twice a week. This is in addition to the moderate-intensity exercise.  Maintain a healthy weight  Body mass index (BMI) is a measurement that can be used to identify possible weight problems. It estimates body fat based on height and weight. Your health care provider can help determine your BMI and help you achieve or maintain a healthy weight.  For females 30 years of age and older: ? A BMI below 18.5 is considered underweight. ? A BMI of 18.5 to 24.9 is normal. ? A BMI of 25 to 29.9 is considered overweight. ? A BMI of 30 and above is considered obese.  Watch levels of cholesterol and blood lipids  You should start having your blood tested for lipids and cholesterol at 45 years of age, then have this test every 5 years.  You may need to have your cholesterol levels checked more often if: ? Your lipid or cholesterol levels are high. ? You are older than 45 years of age. ? You are at high risk for heart disease.  Cancer screening Lung Cancer  Lung cancer screening is recommended for adults 74-28 years old who are at high risk for lung cancer because of a history  of smoking.  A yearly low-dose CT scan of the lungs is recommended for people who: ? Currently smoke. ? Have quit within the past 15 years. ? Have at least a 30-pack-year history of smoking. A pack year is smoking an average of one pack of cigarettes a day for 1 year.  Yearly screening should continue until it has been 15 years since you quit.  Yearly screening should stop if you develop a health problem that would prevent you from having lung cancer treatment.  Breast Cancer  Practice breast self-awareness. This means understanding how your breasts normally appear and feel.  It also means doing regular breast self-exams. Let your health care provider know about any changes, no matter how small.  If you are in your 20s or 30s, you should have a clinical breast exam (CBE) by a health  care provider every 1-3 years as part of a regular health exam.  If you are 57 or older, have a CBE every year. Also consider having a breast X-ray (mammogram) every year.  If you have a family history of breast cancer, talk to your health care provider about genetic screening.  If you are at high risk for breast cancer, talk to your health care provider about having an MRI and a mammogram every year.  Breast cancer gene (BRCA) assessment is recommended for women who have family members with BRCA-related cancers. BRCA-related cancers include: ? Breast. ? Ovarian. ? Tubal. ? Peritoneal cancers.  Results of the assessment will determine the need for genetic counseling and BRCA1 and BRCA2 testing.  Cervical Cancer Your health care provider may recommend that you be screened regularly for cancer of the pelvic organs (ovaries, uterus, and vagina). This screening involves a pelvic examination, including checking for microscopic changes to the surface of your cervix (Pap test). You may be encouraged to have this screening done every 3 years, beginning at age 59.  For women ages 31-65, health care providers may recommend pelvic exams and Pap testing every 3 years, or they may recommend the Pap and pelvic exam, combined with testing for human papilloma virus (HPV), every 5 years. Some types of HPV increase your risk of cervical cancer. Testing for HPV may also be done on women of any age with unclear Pap test results.  Other health care providers may not recommend any screening for nonpregnant women who are considered low risk for pelvic cancer and who do not have symptoms. Ask your health care provider if a screening pelvic exam is right for you.  If you have had past treatment for cervical cancer or a condition that could lead to cancer, you need Pap tests and screening for cancer for at least 20 years after your treatment. If Pap tests have been discontinued, your risk factors (such as having a new  sexual partner) need to be reassessed to determine if screening should resume. Some women have medical problems that increase the chance of getting cervical cancer. In these cases, your health care provider may recommend more frequent screening and Pap tests.  Colorectal Cancer  This type of cancer can be detected and often prevented.  Routine colorectal cancer screening usually begins at 45 years of age and continues through 45 years of age.  Your health care provider may recommend screening at an earlier age if you have risk factors for colon cancer.  Your health care provider may also recommend using home test kits to check for hidden blood in the stool.  A small camera  at the end of a tube can be used to examine your colon directly (sigmoidoscopy or colonoscopy). This is done to check for the earliest forms of colorectal cancer.  Routine screening usually begins at age 60.  Direct examination of the colon should be repeated every 5-10 years through 45 years of age. However, you may need to be screened more often if early forms of precancerous polyps or small growths are found.  Skin Cancer  Check your skin from head to toe regularly.  Tell your health care provider about any new moles or changes in moles, especially if there is a change in a mole's shape or color.  Also tell your health care provider if you have a mole that is larger than the size of a pencil eraser.  Always use sunscreen. Apply sunscreen liberally and repeatedly throughout the day.  Protect yourself by wearing long sleeves, pants, a wide-brimmed hat, and sunglasses whenever you are outside.  Heart disease, diabetes, and high blood pressure  High blood pressure causes heart disease and increases the risk of stroke. High blood pressure is more likely to develop in: ? People who have blood pressure in the high end of the normal range (130-139/85-89 mm Hg). ? People who are overweight or obese. ? People who are  African American.  If you are 9-48 years of age, have your blood pressure checked every 3-5 years. If you are 33 years of age or older, have your blood pressure checked every year. You should have your blood pressure measured twice-once when you are at a hospital or clinic, and once when you are not at a hospital or clinic. Record the average of the two measurements. To check your blood pressure when you are not at a hospital or clinic, you can use: ? An automated blood pressure machine at a pharmacy. ? A home blood pressure monitor.  If you are between 29 years and 8 years old, ask your health care provider if you should take aspirin to prevent strokes.  Have regular diabetes screenings. This involves taking a blood sample to check your fasting blood sugar level. ? If you are at a normal weight and have a low risk for diabetes, have this test once every three years after 45 years of age. ? If you are overweight and have a high risk for diabetes, consider being tested at a younger age or more often. Preventing infection Hepatitis B  If you have a higher risk for hepatitis B, you should be screened for this virus. You are considered at high risk for hepatitis B if: ? You were born in a country where hepatitis B is common. Ask your health care provider which countries are considered high risk. ? Your parents were born in a high-risk country, and you have not been immunized against hepatitis B (hepatitis B vaccine). ? You have HIV or AIDS. ? You use needles to inject street drugs. ? You live with someone who has hepatitis B. ? You have had sex with someone who has hepatitis B. ? You get hemodialysis treatment. ? You take certain medicines for conditions, including cancer, organ transplantation, and autoimmune conditions.  Hepatitis C  Blood testing is recommended for: ? Everyone born from [birth year] through 1964. ? Anyone with known risk factors for hepatitis C.  Sexually transmitted  infections (STIs)  You should be screened for sexually transmitted infections (STIs) including gonorrhea and chlamydia if: ? You are sexually active and are younger than 45 years of age. ?  You are older than 45 years of age and your health care provider tells you that you are at risk for this type of infection. ? Your sexual activity has changed since you were last screened and you are at an increased risk for chlamydia or gonorrhea. Ask your health care provider if you are at risk.  If you do not have HIV, but are at risk, it may be recommended that you take a prescription medicine daily to prevent HIV infection. This is called pre-exposure prophylaxis (PrEP). You are considered at risk if: ? You are sexually active and do not regularly use condoms or know the HIV status of your partner(s). ? You take drugs by injection. ? You are sexually active with a partner who has HIV.  Talk with your health care provider about whether you are at high risk of being infected with HIV. If you choose to begin PrEP, you should first be tested for HIV. You should then be tested every 3 months for as long as you are taking PrEP. Pregnancy  If you are premenopausal and you may become pregnant, ask your health care provider about preconception counseling.  If you may become pregnant, take 400 to 800 micrograms (mcg) of folic acid every day.  If you want to prevent pregnancy, talk to your health care provider about birth control (contraception). Osteoporosis and menopause  Osteoporosis is a disease in which the bones lose minerals and strength with aging. This can result in serious bone fractures. Your risk for osteoporosis can be identified using a bone density scan.  If you are 26 years of age or older, or if you are at risk for osteoporosis and fractures, ask your health care provider if you should be screened.  Ask your health care provider whether you should take a calcium or vitamin D supplement to lower  your risk for osteoporosis.  Menopause may have certain physical symptoms and risks.  Hormone replacement therapy may reduce some of these symptoms and risks. Talk to your health care provider about whether hormone replacement therapy is right for you. Follow these instructions at home:  Schedule regular health, dental, and eye exams.  Stay current with your immunizations.  Do not use any tobacco products including cigarettes, chewing tobacco, or electronic cigarettes.  If you are pregnant, do not drink alcohol.  If you are breastfeeding, limit how much and how often you drink alcohol.  Limit alcohol intake to no more than 1 drink per day for nonpregnant women. One drink equals 12 ounces of beer, 5 ounces of wine, or 1 ounces of hard liquor.  Do not use street drugs.  Do not share needles.  Ask your health care provider for help if you need support or information about quitting drugs.  Tell your health care provider if you often feel depressed.  Tell your health care provider if you have ever been abused or do not feel safe at home. This information is not intended to replace advice given to you by your health care provider. Make sure you discuss any questions you have with your health care provider. Document Released: 09/16/2010 Document Revised: 08/09/2015 Document Reviewed: 12/05/2014 Elsevier Interactive Patient Education  2018 [employer].  IF you received an x-ray today, you will receive an invoice from [facility]. Please contact [facility] at [phone] with questions or concerns regarding your invoice.   IF you received labwork today, you will receive an invoice from [place]. Please contact [facility] at [phone] with questions or  concerns regarding your invoice.   Our billing staff will not be able to assist you with questions regarding bills from these companies.  You will be contacted with the lab results as soon as they are available. The  fastest way to get your results is to activate your My Chart account. Instructions are located on the last page of this paperwork. If you have not heard from [place] regarding the results in 2 weeks, please contact this office.

## 2017-01-02 NOTE — Progress Notes (Signed)
Primary Care at [place], [place] [address] [phone]  Date:  01/02/2017   Name:  Cathy Cole   DOB:  Apr 29, 1970   MRN:  [id number]  PCP:  [facility], [facility]    Chief Complaint: Annual Exam (pap)   History of Present Illness:  This is a 46 y.o. female with [place] who is presenting for CPE.   Complaints: vaginal discharge x 5 days. Described as thick and white. Denies itching.  Would like screening for STDs. Recent negative HIV.  LMP: 5 days ago.  Contraception: none. Tubal ligation Last pap: 04/2014 - with [facility] Negative.   Sexual history: active with one partner. She uses condoms most of the time.  Immunizations: needs tdap Dentist: has not been in a while, since about 2004 Eye: 20/20 b/l  Diet/Exercise: no exercise; eats a lot of fish. No fried foods. No sodas, she does drink a lot of juices. Cooks mostly at home.  Fam hx: mother HTN Tobacco/alcohol/substance use: never smoker, social drinking about once a month, no drug use.   Mammogram: 09/2016  Colonoscopy: never Dexa: never  Review of Systems:  Review of Systems  Constitutional: Negative for chills, diaphoresis, fatigue and fever.  HENT: Negative for congestion, postnasal drip, rhinorrhea, sinus pressure, sneezing and sore throat.   Respiratory: Negative for cough, chest tightness, shortness of breath and wheezing.   Cardiovascular: Negative for chest pain and palpitations.  Gastrointestinal: Negative for abdominal pain, diarrhea, nausea and vomiting.  Genitourinary: Positive for vaginal discharge. Negative for decreased urine volume, difficulty urinating, dysuria, enuresis, flank pain, frequency, hematuria, urgency, vaginal bleeding and vaginal pain.  Musculoskeletal: Negative for back pain.  Neurological: Negative for dizziness, weakness, light-headedness and headaches.    Patient Active Problem List   Diagnosis Date Noted  . Abnormal uterine bleeding (AUB)  11/07/2014  . Menorrhagia 11/07/2014    Prior to Admission medications   Medication Sig Start Date End Date Taking? Authorizing Provider  Cholecalciferol (VITAMIN D3) 2000 UNITS TABS Take 1 tablet by mouth daily.   Yes [provider]  hydrochlorothiazide (MICROZIDE) 12.5 MG capsule Take 12.5 mg by mouth daily.   Yes [provider]  LORazepam (ATIVAN) 0.5 MG tablet Take 0.5 mg by mouth every 8 (eight) hours as needed for anxiety.   Yes [provider]  metoprolol succinate (TOPROL-XL) 25 MG 24 hr tablet Take 25 mg by mouth daily.   Yes [provider]  HYDROcodone-acetaminophen (NORCO/VICODIN) 5-325 MG per tablet Take 1-2 tablets by mouth every 6 (six) hours as needed for moderate pain or severe pain. Patient not taking: Reported on 08/13/2016 11/08/14   Janyth Contes, MD    Allergies  Allergen Reactions  . Bactrim [Sulfamethoxazole-Trimethoprim] Anaphylaxis    Chest pain    Past Surgical History:  Procedure Laterality Date  . BUNIONECTOMY    . NOVASURE ABLATION N/A 11/08/2014   Procedure: NOVASURE ABLATION;  Surgeon: Janyth Contes, MD;  Location: [facility];  Service: Gynecology;  Laterality: N/A;  . TUBAL LIGATION      Social History  Substance Use Topics  . Smoking status: Never Smoker  . Smokeless tobacco: Never Used  . Alcohol use No    Family History  Problem Relation Age of Onset  . Diabetes Other   . Hypertension Other     Medication list has been reviewed and updated.  Physical Examination:  Physical Exam  Constitutional: She is oriented to person, place, and time. She  appears well-developed and well-nourished. No distress.  HENT:  Head: Normocephalic and atraumatic.  Mouth/Throat: Oropharynx is clear and moist.  Eyes: Pupils are equal, round, and reactive to light. Conjunctivae and EOM are normal.  Neck: Neck supple. No thyromegaly present.  Cardiovascular: Normal rate, regular rhythm and normal heart sounds.    No murmur heard. Pulmonary/Chest: Effort normal and breath sounds normal. She has no wheezes.  Abdominal: Soft. She exhibits no distension. There is no tenderness. There is no rebound and no guarding.  Genitourinary: Vagina normal and uterus normal. Right adnexum displays no mass. Left adnexum displays no mass.  Genitourinary Comments: Thick white discharge.   Musculoskeletal: Normal range of motion.  Neurological: She is alert and oriented to person, place, and time. She has normal reflexes.  Skin: Skin is warm and dry.  Psychiatric: She has a normal mood and affect. Her behavior is normal. Judgment and thought content normal.  Vitals reviewed.   BP (!) 146/88   Pulse 88   Temp 98.9 F (37.2 C) (Oral)   Resp 16   Ht _0  (1.626 m)   Wt 183 lb (83 kg)   LMP 12/29/2016   SpO2 100%   BMI 31.41 kg/m   Results for orders placed or performed in visit on 01/02/17  POCT urinalysis dipstick  Result Value Ref Range   Color, UA yellow yellow   Clarity, UA clear clear   Glucose, UA negative negative mg/dL   Bilirubin, UA negative negative   Ketones, POC UA negative negative mg/dL   Spec Grav, UA 1.015 1.010 - 1.025   Blood, UA small (A) negative   pH, UA 7.5 5.0 - 8.0   Protein Ur, POC negative negative mg/dL   Urobilinogen, UA 1.0 0.2 or 1.0 E.U./dL   Nitrite, UA Negative Negative   Leukocytes, UA Negative Negative  POCT Wet + KOH Prep  Result Value Ref Range   Yeast by KOH Absent Absent   Yeast by wet prep Absent Absent   WBC by wet prep None (A) Few   Clue Cells Wet Prep HPF POC Moderate (A) None   Trich by wet prep Absent Absent   Bacteria Wet Prep HPF POC Moderate (A) Few   Epithelial Cells By [employer] Pref (UMFC) None None, Few, Too numerous to count   RBC,UR,HPF,POC None None RBC/hpf    Assessment and Plan: 1. Annual physical exam - Pt presents for physical exam c/o vaginal discharge. + clue cells, will treat for BV and screen STDs. Labs not done today as she is not  fasting. She will RTC for LAB ONLY visit. Future orders placed. Advised healthy lifestyle. Anticipatory guidance provided.   2. Screening, lipid - Lipid panel; Future  3. Screening for endocrine disorder - CMP14+EGFR; Future - POCT urinalysis dipstick  4. Screening for deficiency anemia - CBC with Differential/Platelet; Future - Vitamin B12; Future  5. Vitamin D deficiency - VITAMIN D 25 Hydroxy (Vit-D Deficiency, Fractures); Future  6. Vaginal discharge - POCT Wet + KOH Prep  7. Possible exposure to STD - Chlamydia/Gonococcus/Trichomonas, NAA  8. BV (bacterial vaginosis) - clindamycin (CLEOCIN) 300 MG capsule; Take 1 capsule (300 mg total) by mouth 2 (two) times daily.  Dispense: 14 capsule; Refill: 0  9. Encounter for gynecological examination without abnormal finding   Mercer Pod, PA-C  Primary Care at [place] 01/02/2017 4:13 PM

## 2017-01-03 ENCOUNTER — Ambulatory Visit (INDEPENDENT_AMBULATORY_CARE_PROVIDER_SITE_OTHER): Payer: Managed Care, Other (non HMO) | Admitting: Family Medicine

## 2017-01-03 DIAGNOSIS — Z1329 Encounter for screening for other suspected endocrine disorder: Secondary | ICD-10-CM

## 2017-01-03 DIAGNOSIS — Z130 Encounter for screening for diseases of the blood and blood-forming organs and certain disorders involving the immune mechanism: Secondary | ICD-10-CM

## 2017-01-03 DIAGNOSIS — E559 Vitamin D deficiency, unspecified: Secondary | ICD-10-CM

## 2017-01-03 DIAGNOSIS — Z13220 Encounter for screening for lipoid disorders: Secondary | ICD-10-CM

## 2017-01-03 LAB — CHLAMYDIA/GONOCOCCUS/TRICHOMONAS, NAA
Chlamydia by NAA: NEGATIVE
Gonococcus by NAA: NEGATIVE
Trich vag by NAA: NEGATIVE

## 2017-01-03 NOTE — Progress Notes (Signed)
Nurse visit

## 2017-01-04 NOTE — Progress Notes (Signed)
Negative Chlamydia, Gonococcus and trichomonas

## 2017-01-05 LAB — CBC WITH DIFFERENTIAL/PLATELET
Basophils Absolute: 0 10*3/uL (ref 0.0–0.2)
Basos: 0 %
EOS (ABSOLUTE): 0.2 10*3/uL (ref 0.0–0.4)
Eos: 2 %
Hematocrit: 40.7 % (ref 34.0–46.6)
Hemoglobin: 13.3 g/dL (ref 11.1–15.9)
Immature Grans (Abs): 0 10*3/uL (ref 0.0–0.1)
Immature Granulocytes: 1 %
Lymphocytes Absolute: 2.4 10*3/uL (ref 0.7–3.1)
Lymphs: 28 %
MCH: 29.8 pg (ref 26.6–33.0)
MCHC: 32.7 g/dL (ref 31.5–35.7)
MCV: 91 fL (ref 79–97)
Monocytes Absolute: 0.6 10*3/uL (ref 0.1–0.9)
Monocytes: 7 %
Neutrophils Absolute: 5.3 10*3/uL (ref 1.4–7.0)
Neutrophils: 62 %
Platelets: 364 10*3/uL (ref 150–379)
RBC: 4.47 x10E6/uL (ref 3.77–5.28)
RDW: 13.7 % (ref 12.3–15.4)
WBC: 8.6 10*3/uL (ref 3.4–10.8)

## 2017-01-05 LAB — CMP14+EGFR
ALT: 16 IU/L (ref 0–32)
AST: 13 IU/L (ref 0–40)
Albumin/Globulin Ratio: 1.4 (ref 1.2–2.2)
Albumin: 4.1 g/dL (ref 3.5–5.5)
Alkaline Phosphatase: 71 IU/L (ref 39–117)
BUN/Creatinine Ratio: 12 (ref 9–23)
BUN: 10 mg/dL (ref 6–24)
Bilirubin Total: 0.5 mg/dL (ref 0.0–1.2)
CO2: 25 mmol/L (ref 20–29)
Calcium: 9.1 mg/dL (ref 8.7–10.2)
Chloride: 102 mmol/L (ref 96–106)
Creatinine, Ser: 0.82 mg/dL (ref 0.57–1.00)
GFR calc Af Amer: 100 mL/min/{1.73_m2} (ref >59)
GFR calc non Af Amer: 87 mL/min/{1.73_m2} (ref >59)
Globulin, Total: 3 g/dL (ref 1.5–4.5)
Glucose: 93 mg/dL (ref 65–99)
Potassium: 3.5 mmol/L (ref 3.5–5.2)
Sodium: 141 mmol/L (ref 134–144)
Total Protein: 7.1 g/dL (ref 6.0–8.5)

## 2017-01-05 LAB — LIPID PANEL
Chol/HDL Ratio: 1.9 ratio (ref 0.0–4.4)
Cholesterol, Total: 114 mg/dL (ref 100–199)
HDL: 59 mg/dL (ref >39)
LDL Calculated: 36 mg/dL (ref 0–99)
Triglycerides: 94 mg/dL (ref 0–149)
VLDL Cholesterol Cal: 19 mg/dL (ref 5–40)

## 2017-01-05 LAB — VITAMIN B12: Vitamin B-12: 610 pg/mL (ref 232–1245)

## 2017-01-05 LAB — VITAMIN D 25 HYDROXY (VIT D DEFICIENCY, FRACTURES): Vit D, 25-Hydroxy: 17.3 ng/mL — ABNORMAL LOW (ref 30.0–100.0)

## 2017-01-06 ENCOUNTER — Ambulatory Visit (INDEPENDENT_AMBULATORY_CARE_PROVIDER_SITE_OTHER): Payer: Managed Care, Other (non HMO) | Admitting: Physician Assistant

## 2017-01-06 VITALS — BP 120/88 | HR 84 | Temp 98.5°F | Resp 16 | Ht 64.0 in | Wt 183.2 lb

## 2017-01-06 DIAGNOSIS — F411 Generalized anxiety disorder: Secondary | ICD-10-CM

## 2017-01-06 MED ORDER — LORAZEPAM 0.5 MG PO TABS: 0.5000 mg | ORAL_TABLET | ORAL | 0 refills | Status: DC | PRN

## 2017-01-06 NOTE — Progress Notes (Signed)
   Cathy Cole  MRN: [id number] DOB: February 22, 1971  PCP: [facility], [facility]  Subjective:  Pt is a 46 year old female who presents to clinic for anxiety. Most of her issues occur at night when she is trying to wind down and go to bed. She has trouble shutting her mind down. Sometimes she wakes in the middle of the night worried. She tries deep breathing techniques.   Endorses headaches and fatigue.   She has tried different medications in the past for anxiety when she used to live in [place]. Cannot recall everything she has tried.  Lorazepam 0.5 mg PRN - worked well.  Atarax did not work.    Review of Systems  Constitutional: Positive for fatigue. Negative for appetite change and unexpected weight change.  Neurological: Positive for headaches.  Psychiatric/Behavioral: Positive for sleep disturbance. Negative for agitation, behavioral problems, self-injury and suicidal ideas. The patient is nervous/anxious.     Patient Active Problem List   Diagnosis Date Noted  . Abnormal uterine bleeding (AUB) 11/07/2014  . Menorrhagia 11/07/2014    Current Outpatient Prescriptions on File Prior to Visit  Medication Sig Dispense Refill  . clindamycin (CLEOCIN) 300 MG capsule Take 1 capsule (300 mg total) by mouth 2 (two) times daily. 14 capsule 0  . hydrochlorothiazide (MICROZIDE) 12.5 MG capsule Take 12.5 mg by mouth daily.    . metoprolol succinate (TOPROL-XL) 25 MG 24 hr tablet Take 25 mg by mouth daily.    . Cholecalciferol (VITAMIN D3) 2000 UNITS TABS Take 1 tablet by mouth daily.    Marland Kitchen. LORazepam (ATIVAN) 0.5 MG tablet Take 0.5 mg by mouth every 8 (eight) hours as needed for anxiety.     No current facility-administered medications on file prior to visit.     Allergies  Allergen Reactions  . Bactrim [Sulfamethoxazole-Trimethoprim] Anaphylaxis    Chest pain     Objective:  BP 120/88   Pulse 84   Temp 98.5 F (36.9 C) (Oral)   Resp 16   Ht 5\' 4"  (1.626 m)    Wt 183 lb 3.2 oz (83.1 kg)   LMP 12/29/2016   SpO2 100%   BMI 31.45 kg/m   Physical Exam  Constitutional: She is oriented to person, place, and time and well-developed, well-nourished, and in no distress. No distress.  Neurological: She is alert and oriented to person, place, and time. GCS score is 15.  Skin: Skin is warm and dry.  Psychiatric: Mood, memory and judgment normal. She has a flat affect.  Vitals reviewed.   Assessment and Plan :  1. Generalized anxiety disorder - LORazepam (ATIVAN) 0.5 MG tablet; Take 1 tablet (0.5 mg total) by mouth as needed for anxiety.  Dispense: 30 tablet; Refill: 0 - Discussed relaxation techniques with pt. OK to start Ativan 0.5mg  PRN as this worked well for her in the past. RTC in 4 weeks for f/u. She agrees with plan.    Marco CollieWhitney Ceasia Elwell, PA-C  Primary Care at [facility] 01/06/2017 4:52 PM

## 2017-01-06 NOTE — Patient Instructions (Addendum)
  Come back and see me in 1 month for recheck.  Work on techniques to relax.   Thank you for coming in today. I hope you feel we met your needs.  Feel free to call PCP if you have any questions or further requests.  Please consider signing up for MyChart if you do not already have it, as this is a great way to communicate with me.  Best,  Whitney McVey, PA-C   IF you received an x-ray today, you will receive an invoice from [facility]. Please contact [facility] at [phone] with questions or concerns regarding your invoice.   IF you received labwork today, you will receive an invoice from [facility]. Please contact [facility] at [phone] with questions or concerns regarding your invoice.   Our billing staff will not be able to assist you with questions regarding bills from these companies.  You will be contacted with the lab results as soon as they are available. The fastest way to get your results is to activate your My Chart account. Instructions are located on the last page of this paperwork. If you have not heard from [place] regarding the results in 2 weeks, please contact this office.

## 2017-01-08 NOTE — Progress Notes (Signed)
Start  initial supplementation with 800 to 1000 international units daily. Add calcium 1000mg  qd. Recheck vitamin D level in 3 months.

## 2017-03-18 ENCOUNTER — Ambulatory Visit: Payer: Managed Care, Other (non HMO) | Admitting: Physician Assistant

## 2017-03-27 ENCOUNTER — Encounter: Payer: Self-pay | Admitting: Physician Assistant

## 2017-03-27 ENCOUNTER — Other Ambulatory Visit: Payer: Self-pay

## 2017-03-27 ENCOUNTER — Ambulatory Visit (INDEPENDENT_AMBULATORY_CARE_PROVIDER_SITE_OTHER): Payer: BLUE CROSS/BLUE SHIELD | Admitting: Physician Assistant

## 2017-03-27 ENCOUNTER — Telehealth: Payer: Self-pay | Admitting: *Deleted

## 2017-03-27 VITALS — BP 116/76 | HR 73 | Temp 98.1°F | Resp 16 | Ht 64.0 in | Wt 187.2 lb

## 2017-03-27 DIAGNOSIS — N760 Acute vaginitis: Secondary | ICD-10-CM | POA: Diagnosis not present

## 2017-03-27 DIAGNOSIS — N898 Other specified noninflammatory disorders of vagina: Secondary | ICD-10-CM

## 2017-03-27 DIAGNOSIS — B9689 Other specified bacterial agents as the cause of diseases classified elsewhere: Secondary | ICD-10-CM | POA: Diagnosis not present

## 2017-03-27 LAB — POCT WET + KOH PREP
Trich by wet prep: ABSENT
Yeast by KOH: ABSENT
Yeast by wet prep: ABSENT

## 2017-03-27 MED ORDER — METRONIDAZOLE 500 MG PO TABS: 500.0000 mg | ORAL_TABLET | Freq: Two times a day (BID) | ORAL | 0 refills | Status: DC

## 2017-03-27 MED ORDER — CLINDAMYCIN HCL 300 MG PO CAPS: 300.0000 mg | ORAL_CAPSULE | Freq: Two times a day (BID) | ORAL | 0 refills | Status: AC

## 2017-03-27 NOTE — Progress Notes (Addendum)
Cathy Cole  MRN: [id number] DOB: November 20, 1970  PCP: [facility], [facility]  Subjective:  Pt is a 47 year old female who presents to clinic for vaginal discharge x 2 weeks.  She endorses mild white discharge with a mild odor.  Denies fever, chills, back pain, flank pain, urinary symptoms. No new sexual partner. Not concerned today for STDs.  She had three BV infections in 2018.   Review of Systems  Constitutional: Negative for chills, fatigue and fever.  Gastrointestinal: Negative for abdominal pain, diarrhea, nausea and vomiting.  Genitourinary: Positive for vaginal discharge. Negative for decreased urine volume, difficulty urinating, dysuria, enuresis, flank pain, frequency, hematuria, menstrual problem, pelvic pain, urgency and vaginal pain.  Musculoskeletal: Negative for back pain.  Neurological: Negative for dizziness, weakness, light-headedness and headaches.    Patient Active Problem List   Diagnosis Date Noted  . Abnormal uterine bleeding (AUB) 11/07/2014  . Menorrhagia 11/07/2014    Current Outpatient Medications on File Prior to Visit  Medication Sig Dispense Refill  . Cholecalciferol (VITAMIN D3) 2000 UNITS TABS Take 1 tablet by mouth daily.    . hydrochlorothiazide (MICROZIDE) 12.5 MG capsule Take 12.5 mg by mouth daily.    Marland Kitchen. LORazepam (ATIVAN) 0.5 MG tablet Take 1 tablet (0.5 mg total) by mouth as needed for anxiety. 30 tablet 0  . metoprolol succinate (TOPROL-XL) 25 MG 24 hr tablet Take 25 mg by mouth daily.     No current facility-administered medications on file prior to visit.     Allergies  Allergen Reactions  . Bactrim [Sulfamethoxazole-Trimethoprim] Anaphylaxis    Chest pain     Objective:  BP 116/76   Pulse 73   Temp 98.1 F (36.7 C) (Oral)   Resp 16   Ht 5\' 4"  (1.626 m)   Wt 187 lb 3.2 oz (84.9 kg)   LMP 03/26/2017   SpO2 100%   BMI 32.13 kg/m   Physical Exam  Constitutional: She is oriented to person, place, and time and  well-developed, well-nourished, and in no distress. No distress.  Cardiovascular: Normal rate, regular rhythm and normal heart sounds.  Abdominal: Soft. There is no tenderness. There is no CVA tenderness.  Neurological: She is alert and oriented to person, place, and time. GCS score is 15.  Skin: Skin is warm and dry.  Psychiatric: Mood, memory, affect and judgment normal.  Vitals reviewed.  Results for orders placed or performed in visit on 03/27/17  POCT Wet + KOH Prep  Result Value Ref Range   Yeast by KOH Absent Absent   Yeast by wet prep Absent Absent   WBC by wet prep Few Few   Clue Cells Wet Prep HPF POC Few (A) None   Trich by wet prep Absent Absent   Bacteria Wet Prep HPF POC Few Few   Epithelial Cells By [employer] Pref (UMFC) Many (A) None, Few, Too numerous to count   RBC,UR,HPF,POC None None RBC/hpf    Assessment and Plan :  1. Vaginal discharge 2. Bacterial vaginitis - POCT Wet + KOH Prep - clindamycin (CLEOCIN) 300 MG capsule; Take 1 capsule (300 mg total) by mouth 2 (two) times daily for 7 days.  Dispense: 14 capsule; Refill: 0  - Wet prep + clue cells, plan to treat for BV. RTC if symptoms return. Pt had three BV infections last year. Consider treatment for recurrent infection if she returns.   Marco CollieWhitney Ramona Slinger, PA-C  Primary [facility] at [facility] 03/27/2017 5:26 PM

## 2017-03-27 NOTE — Patient Instructions (Addendum)
You are positive for BV. Start taking Flagly as directed. Do not drink while taking this medicine.  Come back if you have another infection, we can try vaginal boric acid suppositories for recurrent infections.   Bacterial Vaginosis Bacterial vaginosis is a vaginal infection that occurs when the normal balance of bacteria in the vagina is disrupted. It results from an overgrowth of certain bacteria. This is the most common vaginal infection among women ages 23-44. Because bacterial vaginosis increases your risk for STIs (sexually transmitted infections), getting treated can help reduce your risk for chlamydia, gonorrhea, herpes, and HIV (human immunodeficiency virus). Treatment is also important for preventing complications in pregnant women, because this condition can cause an early (premature) delivery. What are the causes? This condition is caused by an increase in harmful bacteria that are normally present in small amounts in the vagina. However, the reason that the condition develops is not fully understood. What increases the risk? The following factors may make you more likely to develop this condition:  Having a new sexual partner or multiple sexual partners.  Having unprotected sex.  Douching.  Having an intrauterine device (IUD).  Smoking.  Drug and alcohol abuse.  Taking certain antibiotic medicines.  Being pregnant.  You cannot get bacterial vaginosis from toilet seats, bedding, swimming pools, or contact with objects around you. What are the signs or symptoms? Symptoms of this condition include:  Grey or white vaginal discharge. The discharge can also be watery or foamy.  A fish-like odor with discharge, especially after sexual intercourse or during menstruation.  Itching in and around the vagina.  Burning or pain with urination.  Some women with bacterial vaginosis have no signs or symptoms. How is this diagnosed? This condition is diagnosed based on:  Your  medical history.  A physical exam of the vagina.  Testing a sample of vaginal fluid under a microscope to look for a large amount of bad bacteria or abnormal cells. Your health care provider may use a cotton swab or a small wooden spatula to collect the sample.  How is this treated? This condition is treated with antibiotics. These may be given as a pill, a vaginal cream, or a medicine that is put into the vagina (suppository). If the condition comes back after treatment, a second round of antibiotics may be needed. Follow these instructions at home: Medicines  Take over-the-counter and prescription medicines only as told by your health care provider.  Take or use your antibiotic as told by your health care provider. Do not stop taking or using the antibiotic even if you start to feel better. General instructions  If you have a female sexual partner, tell her that you have a vaginal infection. She should see her health care provider and be treated if she has symptoms. If you have a female sexual partner, he does not need treatment.  During treatment: ? Avoid sexual activity until you finish treatment. ? Do not douche. ? Avoid alcohol as directed by your health care provider. ? Avoid breastfeeding as directed by your health care provider.  Drink enough water and fluids to keep your urine clear or pale yellow.  Keep the area around your vagina and rectum clean. ? Wash the area daily with warm water. ? Wipe yourself from front to back after using the toilet.  Keep all follow-up visits as told by your health care provider. This is important. How is this prevented?  Do not douche.  Wash the outside of your vagina with  warm water only.  Use protection when having sex. This includes latex condoms and dental dams.  Limit how many sexual partners you have. To help prevent bacterial vaginosis, it is best to have sex with just one partner (monogamous).  Make sure you and your sexual  partner are tested for STIs.  Wear cotton or cotton-lined underwear.  Avoid wearing tight pants and pantyhose, especially during summer.  Limit the amount of alcohol that you drink.  Do not use any products that contain nicotine or tobacco, such as cigarettes and e-cigarettes. If you need help quitting, ask your health care provider.  Do not use illegal drugs. Where to find more information:  Centers for Disease Control and Prevention: SolutionApps.co.zawww.cdc.gov/std  American Sexual Health Association (ASHA): www.ashastd.org  U.S. Department of Health and Health and safety inspectorHuman Services, Office on Women's Health: ConventionalMedicines.siwww.womenshealth.gov/ or http://www.anderson-williamson.info/https://www.womenshealth.gov/a-z-topics/bacterial-vaginosis Contact a health care provider if:  Your symptoms do not improve, even after treatment.  You have more discharge or pain when urinating.  You have a fever.  You have pain in your abdomen.  You have pain during sex.  You have vaginal bleeding between periods. Summary  Bacterial vaginosis is a vaginal infection that occurs when the normal balance of bacteria in the vagina is disrupted.  Because bacterial vaginosis increases your risk for STIs (sexually transmitted infections), getting treated can help reduce your risk for chlamydia, gonorrhea, herpes, and HIV (human immunodeficiency virus). Treatment is also important for preventing complications in pregnant women, because the condition can cause an early (premature) delivery.  This condition is treated with antibiotic medicines. These may be given as a pill, a vaginal cream, or a medicine that is put into the vagina (suppository). This information is not intended to replace advice given to you by your health care provider. Make sure you discuss any questions you have with your health care provider. Document Released: 03/03/2005 Document Revised: 07/07/2016 Document Reviewed: 11/17/2015 Elsevier Interactive Patient Education  2018 [employer]   IF you received  an x-ray today, you will receive an invoice from [facility]. Please contact [facility] at [phone] with questions or concerns regarding your invoice.   IF you received labwork today, you will receive an invoice from [facility]. Please contact [facility] at [phone] with questions or concerns regarding your invoice.   Our billing staff will not be able to assist you with questions regarding bills from these companies.  You will be contacted with the lab results as soon as they are available. The fastest way to get your results is to activate your My Chart account. Instructions are located on the last page of this paperwork. If you have not heard from us regarding the results in 2 weeks, please contact this office.

## 2017-03-27 NOTE — Addendum Note (Signed)
Addended by: Sebastian AcheMCVEY, Burkley Dech WHITNEY on: 03/27/2017 06:13 PM   Modules accepted: Orders

## 2017-03-27 NOTE — Telephone Encounter (Signed)
Rx for Flagyl cancelled at [facility] [address] per [place]

## 2017-04-20 ENCOUNTER — Other Ambulatory Visit: Payer: Self-pay | Admitting: Physician Assistant

## 2017-04-20 DIAGNOSIS — F411 Generalized anxiety disorder: Secondary | ICD-10-CM

## 2017-04-20 NOTE — Telephone Encounter (Signed)
Lorazepam refill Last OV: 01/06/17  Last Refill:01/06/17 #30 tab zero RF Pharmacy:[facility] [address]

## 2017-06-01 ENCOUNTER — Other Ambulatory Visit: Payer: Self-pay | Admitting: Physician Assistant

## 2017-06-01 DIAGNOSIS — B9689 Other specified bacterial agents as the cause of diseases classified elsewhere: Secondary | ICD-10-CM

## 2017-06-01 DIAGNOSIS — N760 Acute vaginitis: Secondary | ICD-10-CM

## 2017-06-02 NOTE — Telephone Encounter (Signed)
Pt called   Back  Regarding  Rx  Request   -  Previous   OV  Note  Reviewed   Pt  Needs  Appointment appt  Made

## 2017-06-02 NOTE — Telephone Encounter (Signed)
VM   Left  On pts  answering  Machine  To  Call  Back  With  Her  Symptoms  For  Anti  Biotic  Refill  Request

## 2017-06-03 ENCOUNTER — Ambulatory Visit: Payer: Self-pay | Admitting: Physician Assistant

## 2017-06-26 ENCOUNTER — Encounter: Payer: Self-pay | Admitting: Physician Assistant

## 2017-06-26 ENCOUNTER — Ambulatory Visit (INDEPENDENT_AMBULATORY_CARE_PROVIDER_SITE_OTHER): Payer: BLUE CROSS/BLUE SHIELD | Admitting: Physician Assistant

## 2017-06-26 ENCOUNTER — Other Ambulatory Visit: Payer: Self-pay

## 2017-06-26 VITALS — BP 110/84 | HR 82 | Temp 98.0°F | Resp 16 | Ht 63.5 in | Wt 191.4 lb

## 2017-06-26 DIAGNOSIS — N760 Acute vaginitis: Secondary | ICD-10-CM

## 2017-06-26 DIAGNOSIS — N898 Other specified noninflammatory disorders of vagina: Secondary | ICD-10-CM | POA: Diagnosis not present

## 2017-06-26 DIAGNOSIS — B9689 Other specified bacterial agents as the cause of diseases classified elsewhere: Secondary | ICD-10-CM | POA: Diagnosis not present

## 2017-06-26 DIAGNOSIS — F411 Generalized anxiety disorder: Secondary | ICD-10-CM | POA: Diagnosis not present

## 2017-06-26 DIAGNOSIS — I10 Essential (primary) hypertension: Secondary | ICD-10-CM | POA: Diagnosis not present

## 2017-06-26 LAB — POCT URINALYSIS DIP (MANUAL ENTRY)
Bilirubin, UA: NEGATIVE
Glucose, UA: NEGATIVE mg/dL
Ketones, POC UA: NEGATIVE mg/dL
Leukocytes, UA: NEGATIVE
Nitrite, UA: NEGATIVE
Protein Ur, POC: NEGATIVE mg/dL
Spec Grav, UA: 1.02 (ref 1.010–1.025)
Urobilinogen, UA: 0.2 E.U./dL
pH, UA: 6 (ref 5.0–8.0)

## 2017-06-26 LAB — POCT WET + KOH PREP
Trich by wet prep: ABSENT
Yeast by KOH: ABSENT
Yeast by wet prep: ABSENT

## 2017-06-26 MED ORDER — METRONIDAZOLE 500 MG PO TABS: 500.0000 mg | ORAL_TABLET | Freq: Two times a day (BID) | ORAL | 0 refills | Status: DC

## 2017-06-26 MED ORDER — LORAZEPAM 0.5 MG PO TABS: ORAL_TABLET | ORAL | 1 refills | Status: DC

## 2017-06-26 MED ORDER — METOPROLOL SUCCINATE ER 25 MG PO TB24: 25.0000 mg | ORAL_TABLET | Freq: Every day | ORAL | 3 refills | Status: DC

## 2017-06-26 MED ORDER — AMBULATORY NON FORMULARY MEDICATION
5 refills | Status: DC
Start: 2017-06-26 — End: 2017-06-26

## 2017-06-26 MED ORDER — HYDROCHLOROTHIAZIDE 12.5 MG PO CAPS: 12.5000 mg | ORAL_CAPSULE | Freq: Every day | ORAL | 3 refills | Status: DC

## 2017-06-26 MED ORDER — AMBULATORY NON FORMULARY MEDICATION: 5 refills | Status: DC

## 2017-06-26 MED ORDER — CLINDAMYCIN HCL 300 MG PO CAPS: 300.0000 mg | ORAL_CAPSULE | Freq: Two times a day (BID) | ORAL | 0 refills | Status: DC

## 2017-06-26 NOTE — Progress Notes (Signed)
Cathy Cole  MRN: [id number] DOB: [birth year]  PCP: [facility]  Subjective:  Pt is a 47 year old female who presents to clinic for vaginal discharge x 2 weeks.  She has h/o BV infections. Prior BV infections: 03/27/17, 01/02/17, 09/22/16, 08/13/16.  Endorses occasional itching.  Not concerned today for STDs. She has never tried boric acid suppression therapy. Denies urinary symptoms, back pain, flank pain, abdominal pain, fever, chills.   Medication refill: HTN - HCTZ 12.5mg  and toprolol 25mg . Blood pressure today is 110/84  Review of Systems  Constitutional: Negative for chills, fatigue and fever.  Gastrointestinal: Negative for abdominal pain.  Genitourinary: Positive for vaginal discharge and vaginal pain ("itching"). Negative for decreased urine volume, difficulty urinating, dysuria, enuresis, flank pain, frequency, hematuria and urgency.  Musculoskeletal: Negative for back pain.    Patient Active Problem List   Diagnosis Date Noted  . Abnormal uterine bleeding (AUB) 11/07/2014  . Menorrhagia 11/07/2014    Current Outpatient Medications on File Prior to Visit  Medication Sig Dispense Refill  . Cholecalciferol (VITAMIN D3) 2000 UNITS TABS Take 1 tablet by mouth daily.    . hydrochlorothiazide (MICROZIDE) 12.5 MG capsule Take 12.5 mg by mouth daily.    Marland Kitchen LORazepam (ATIVAN) 0.5 MG tablet TAKE 1 TABLET BY MOUTH BY MOUTH AS NEEDED FOR ANXIETY 30 tablet 0  . metoprolol succinate (TOPROL-XL) 25 MG 24 hr tablet Take 25 mg by mouth daily.    . metoprolol succinate (TOPROL-XL) 25 MG 24 hr tablet Take 25 mg by mouth daily.     No current facility-administered medications on file prior to visit.     Allergies  Allergen Reactions  . Bactrim [Sulfamethoxazole-Trimethoprim] Anaphylaxis    Chest pain     Objective:  BP 110/84   Pulse 82   Temp 98 F (36.7 C) (Oral)   Resp 16   Ht 5' 3.5" (1.613 m)   Wt 191 lb 6.4 oz (86.8 kg)   LMP 05/15/2017    SpO2 99%   BMI 33.37 kg/m   Physical Exam  Constitutional: She is oriented to person, place, and time. No distress.  Cardiovascular: Normal rate, regular rhythm and normal heart sounds.  Neurological: She is alert and oriented to person, place, and time.  Skin: Skin is warm and dry.  Psychiatric: Judgment normal.  Vitals reviewed.  Results for orders placed or performed in visit on 06/26/17  POCT Wet + KOH Prep  Result Value Ref Range   Yeast by KOH Absent Absent   Yeast by wet prep Absent Absent   WBC by wet prep None (A) Few   Clue Cells Wet Prep HPF POC Moderate (A) None   Trich by wet prep Absent Absent   Bacteria Wet Prep HPF POC Many (A) Few   Epithelial Cells By [employer] ([facility]FC) None None, Few, Too numerous to count   RBC,UR,HPF,POC None None RBC/hpf  POCT urinalysis dipstick  Result Value Ref Range   Color, UA yellow yellow   Clarity, UA clear clear   Glucose, UA negative negative mg/dL   Bilirubin, UA negative negative   Ketones, POC UA negative negative mg/dL   Spec Grav, UA 9.147 8.295 - 1.025   Blood, UA small (A) negative   pH, UA 6.0 5.0 - 8.0   Protein Ur, POC negative negative mg/dL   Urobilinogen, UA 0.2 0.2 or 1.0 E.U./dL   Nitrite, UA Negative Negative   Leukocytes, UA Negative Negative    Assessment  and Plan :  1. Bacterial vaginitis 2. Vaginal discharge - POCT Wet + KOH Prep - POCT urinalysis dipstick - AMBULATORY NON FORMULARY MEDICATION; Boric Acid Suppository 600 mg. Insert 1 PV once daily at bedtime x 30 days  Dispense: 30 suppository; Refill: 5 - clindamycin (CLEOCIN) 300 MG capsule; Take 1 capsule (300 mg total) by mouth 2 (two) times daily.  Dispense: 14 capsule; Refill: 0 - Pt presents with vaginal irritation. +clue cells on wet prep. She has h/o BV infections. Prior BV infections: 03/27/17, 01/02/17, 09/22/16, 08/13/16. Suspect BV never resolves.  Plan to treat with clindamycin then 30 days boric acid suppository q hs. RTC in 6 weeks for recheck  wet prep. Will consider other suppression therapy if needed.   Marco CollieWhitney Mcadoo Muzquiz, PA-C  Primary Care at [facility] 06/26/2017 3:28 PM

## 2017-06-26 NOTE — Patient Instructions (Addendum)
You are positive for BV.  Take flagyl x 7 days. Then start Boric acid - 1 suppository every night for 30 days.  Come back and see me in 6 weeks for retesting to make sure it's gone.    Thank you for coming in today. I hope you feel we met your needs.  Feel free to call PCP if you have any questions or further requests.  Please consider signing up for MyChart if you do not already have it, as this is a great way to communicate with me.  Best,  Whitney McVey, PA-C  IF you received an x-ray today, you will receive an invoice from [facility]. Please contact [facility] at [phone] with questions or concerns regarding your invoice.   IF you received labwork today, you will receive an invoice from [facility]. Please contact [facility] at [phone] with questions or concerns regarding your invoice.   Our billing staff will not be able to assist you with questions regarding bills from these companies.  You will be contacted with the lab results as soon as they are available. The fastest way to get your results is to activate your My Chart account. Instructions are located on the last page of this paperwork. If you have not heard from us regarding the results in 2 weeks, please contact this office.     

## 2017-09-14 ENCOUNTER — Encounter: Payer: Self-pay | Admitting: Physician Assistant

## 2017-09-14 ENCOUNTER — Other Ambulatory Visit: Payer: Self-pay

## 2017-09-14 ENCOUNTER — Ambulatory Visit: Payer: BLUE CROSS/BLUE SHIELD | Admitting: Physician Assistant

## 2017-09-14 VITALS — BP 124/80 | HR 97 | Temp 98.8°F | Resp 20 | Ht 64.33 in | Wt 188.6 lb

## 2017-09-14 DIAGNOSIS — J029 Acute pharyngitis, unspecified: Secondary | ICD-10-CM

## 2017-09-14 DIAGNOSIS — R05 Cough: Secondary | ICD-10-CM | POA: Diagnosis not present

## 2017-09-14 DIAGNOSIS — J069 Acute upper respiratory infection, unspecified: Secondary | ICD-10-CM | POA: Diagnosis not present

## 2017-09-14 DIAGNOSIS — R059 Cough, unspecified: Secondary | ICD-10-CM

## 2017-09-14 LAB — POCT RAPID STREP A (OFFICE): RAPID STREP A SCREEN: NEGATIVE

## 2017-09-14 MED ORDER — AZELASTINE HCL 0.1 % NA SOLN: 2.0000 | Freq: Two times a day (BID) | NASAL | 0 refills | Status: DC

## 2017-09-14 MED ORDER — BENZONATATE 100 MG PO CAPS
100.0000 mg | ORAL_CAPSULE | Freq: Three times a day (TID) | ORAL | 0 refills | Status: DC | PRN
Start: 2017-09-14 — End: 2017-09-23

## 2017-09-14 MED ORDER — HYDROCODONE-HOMATROPINE 5-1.5 MG/5ML PO SYRP: 5.0000 mL | ORAL_SOLUTION | Freq: Three times a day (TID) | ORAL | 0 refills | Status: DC | PRN

## 2017-09-14 NOTE — Patient Instructions (Addendum)
- We will treat this as a respiratory viral infection.  - I recommend you rest, drink plenty of fluids, eat light meals including soups.  - You may use cough syrup at night for your cough and sore throat, Tessalon pearls during the day. Be aware that cough syrup can definitely make you drowsy and sleepy so do not drive or operate any heavy machinery if it is affecting you during the day.  - You may also use Tylenol or ibuprofen over-the-counter for your sore throat. Tea recipe for sore throat: boil water, add 2 inches shaved ginger root, steep 15 minutes, add juice from 2 full lemons, and 2 tbsp honey. - Please let me know if you are not seeing any improvement or get worse in 3-5 days.   Upper Respiratory Infection, Adult Most upper respiratory infections (URIs) are caused by a virus. A URI affects the nose, throat, and upper air passages. The most common type of URI is often called "the common cold." Follow these instructions at home:  Take medicines only as told by your doctor.  Gargle warm saltwater or take cough drops to comfort your throat as told by your doctor.  Use a warm mist humidifier or inhale steam from a shower to increase air moisture. This may make it easier to breathe.  Drink enough fluid to keep your pee (urine) clear or pale yellow.  Eat soups and other clear broths.  Have a healthy diet.  Rest as needed.  Go back to work when your fever is gone or your doctor says it is okay. ? You may need to stay home longer to avoid giving your URI to others. ? You can also wear a face mask and wash your hands often to prevent spread of the virus.  Use your inhaler more if you have asthma.  Do not use any tobacco products, including cigarettes, chewing tobacco, or electronic cigarettes. If you need help quitting, ask your doctor. Contact a doctor if:  You are getting worse, not better.  Your symptoms are not helped by medicine.  You have chills.  You are getting more  short of breath.  You have brown or red mucus.  You have yellow or brown discharge from your nose.  You have pain in your face, especially when you bend forward.  You have a fever.  You have puffy (swollen) neck glands.  You have pain while swallowing.  You have white areas in the back of your throat. Get help right away if:  You have very bad or constant: ? Headache. ? Ear pain. ? Pain in your forehead, behind your eyes, and over your cheekbones (sinus pain). ? Chest pain.  You have long-lasting (chronic) lung disease and any of the following: ? Wheezing. ? Long-lasting cough. ? Coughing up blood. ? A change in your usual mucus.  You have a stiff neck.  You have changes in your: ? Vision. ? Hearing. ? Thinking. ? Mood. This information is not intended to replace advice given to you by your health care provider. Make sure you discuss any questions you have with your health care provider. Document Released: 08/20/2007 Document Revised: 11/04/2015 Document Reviewed: 06/08/2013 Elsevier Interactive Patient Education  2018 [employer]  IF you received an x-ray today, you will receive an invoice from [facility]. Please contact [facility] at [phone] with questions or concerns regarding your invoice.   IF you received labwork today, you will receive an invoice from [facility]. Please contact [facility] at [phone]  with questions or concerns regarding your invoice.   Our billing staff will not be able to assist you with questions regarding bills from these companies.  You will be contacted with the lab results as soon as they are available. The fastest way to get your results is to activate your My Chart account. Instructions are located on the last page of this paperwork. If you have not heard from [place] regarding the results in 2 weeks, please contact this office.

## 2017-09-14 NOTE — Progress Notes (Signed)
MRN: [id number] DOB: [birth year]  Subjective:   Cathy Cole is a 47 y.o. female presenting for chief complaint of Cough (X 1 week) and Sore Throat (X 1 week) .  Reports one week history of sore throat, left ear fullness, nasal congestion, and dry cough x 1 week.  Has tried mucinex and robitussin with no relief. Cannot sleep at night due to cough.  Denies fever, sinus pain, ear drainage, wheezing, shortness of breath, chest pain and myalgia, nausea, vomiting, abdominal pain and diarrhea. Has had sick contact with grandbaby. No history of seasonal allergies, no history of asthma. Patient has not had flu shot this season. Denies smoking. Denies recent travel. Denies any other aggravating or relieving factors, no other questions or concerns.  Cathy Cole has a current medication list which includes the following prescription(s): hydrochlorothiazide, lorazepam, metoprolol succinate, AMBULATORY NON FORMULARY MEDICATION, azelastine, benzonatate, vitamin d3, hydrocodone-homatropine, and hydroxyzine. Also is allergic to bactrim [sulfamethoxazole-trimethoprim] and flagyl [metronidazole].  Cathy Cole  has a past medical history of Abnormal uterine bleeding (AUB) (11/07/2014), Hypertension, and Menorrhagia (11/07/2014). Also  has a past surgical history that includes Tubal ligation; Bunionectomy; and Novasure ablation (N/A, 11/08/2014).   Objective:   Vitals: BP 124/80 (BP Location: Left Arm, Patient Position: Sitting, Cuff Size: Normal)   Pulse 97   Temp 98.8 F (37.1 C) (Oral)   Resp 20   Ht 5' 4.33" (1.634 m)   Wt 188 lb 9.6 oz (85.5 kg)   LMP 08/24/2017 (Approximate)   SpO2 99%   BMI 32.04 kg/m   Physical Exam  Constitutional: She is oriented to person, place, and time. She appears well-developed and well-nourished. No distress.  HENT:  Head: Normocephalic and atraumatic.  Right Ear: Tympanic membrane, external ear and ear canal normal.  Left Ear: Tympanic membrane, external ear and ear  canal normal.  Nose: Mucosal edema present. Right sinus exhibits no maxillary sinus tenderness and no frontal sinus tenderness. Left sinus exhibits no maxillary sinus tenderness and no frontal sinus tenderness.  Mouth/Throat: Uvula is midline and mucous membranes are normal. Posterior oropharyngeal erythema present. No posterior oropharyngeal edema or tonsillar abscesses. No tonsillar exudate.  Eyes: Conjunctivae are normal.  Neck: Normal range of motion.  Cardiovascular: Normal rate, regular rhythm, normal heart sounds and intact distal pulses.  Pulmonary/Chest: Effort normal and breath sounds normal. She has no decreased breath sounds. She has no wheezes. She has no rhonchi. She has no rales.  Lymphadenopathy:       Head (right side): No submental, no submandibular, no tonsillar, no preauricular, no posterior auricular and no occipital adenopathy present.       Head (left side): No submental, no submandibular, no tonsillar, no preauricular, no posterior auricular and no occipital adenopathy present.    She has no cervical adenopathy.       Right: No supraclavicular adenopathy present.       Left: No supraclavicular adenopathy present.  Neurological: She is alert and oriented to person, place, and time.  Skin: Skin is warm and dry.  Psychiatric: She has a normal mood and affect.  Vitals reviewed.   Results for orders placed or performed in visit on 09/14/17 (from the past 24 hour(s))  POCT rapid strep A     Status: None   Collection Time: 09/14/17  5:19 PM  Result Value Ref Range   Rapid Strep A Screen Negative Negative     Assessment and Plan :  1. Acute upper respiratory infection - Likely viral in  etiology d/t reassuring physical exam findings and labs. - Advised supportive care, offered symptomatic relief. - Return to clinic if symptoms worsen or fail to improve in 3-5 days, otherwise return to clinic as needed. - HYDROcodone-homatropine (HYCODAN) 5-1.5 MG/5ML syrup; Take 5 mLs  by mouth every 8 (eight) hours as needed for cough.  Dispense: 75 mL; Refill: 0 - benzonatate (TESSALON) 100 MG capsule; Take 1-2 capsules (100-200 mg total) by mouth 3 (three) times daily as needed for cough.  Dispense: 40 capsule; Refill: 0 - azelastine (ASTELIN) 0.1 % nasal spray; Place 2 sprays into both nostrils 2 (two) times daily. Use in each nostril as directed  Dispense: 30 mL; Refill: 0  2. Sore throat - POCT rapid strep A - Culture, Group A Strep  3. Cough  Side effects, risks, benefits, and alternatives of the medications and treatment plan prescribed today were discussed, and patient expressed understanding of the instructions given. No barriers to understanding were identified. Red flags discussed in detail. Pt expressed understanding regarding what to do in case of emergency/urgent symptoms.  Benjiman Core, PA-C  Primary Care at [facility] 09/14/2017 6:35 PM

## 2017-09-17 LAB — CULTURE, GROUP A STREP: Strep A Culture: NEGATIVE

## 2017-09-19 DIAGNOSIS — J029 Acute pharyngitis, unspecified: Secondary | ICD-10-CM | POA: Diagnosis not present

## 2017-09-19 DIAGNOSIS — J069 Acute upper respiratory infection, unspecified: Secondary | ICD-10-CM | POA: Diagnosis not present

## 2017-09-23 ENCOUNTER — Ambulatory Visit: Payer: BLUE CROSS/BLUE SHIELD | Admitting: Physician Assistant

## 2017-09-23 ENCOUNTER — Encounter: Payer: Self-pay | Admitting: Physician Assistant

## 2017-09-23 VITALS — BP 130/84 | HR 87 | Temp 98.5°F | Resp 16 | Ht 64.0 in | Wt 191.6 lb

## 2017-09-23 DIAGNOSIS — Z01818 Encounter for other preprocedural examination: Secondary | ICD-10-CM

## 2017-09-23 DIAGNOSIS — Z0000 Encounter for general adult medical examination without abnormal findings: Secondary | ICD-10-CM | POA: Diagnosis not present

## 2017-09-23 NOTE — Patient Instructions (Signed)
     IF you received an x-ray today, you will receive an invoice from [facility]. Please contact [facility] at [phone] with questions or concerns regarding your invoice.   IF you received labwork today, you will receive an invoice from [facility]. Please contact [facility] at [phone] with questions or concerns regarding your invoice.   Our billing staff will not be able to assist you with questions regarding bills from these companies.  You will be contacted with the lab results as soon as they are available. The fastest way to get your results is to activate your My Chart account. Instructions are located on the last page of this paperwork. If you have not heard from us regarding the results in 2 weeks, please contact this office.     

## 2017-09-23 NOTE — Progress Notes (Signed)
   Cathy Cole  MRN: [id number] DOB: 1970-09-22  PCP: [facility]  Subjective:  Pt is a 47 year old female who presents to clinic for preop clearance.  Patient plans to have a tummy tuck with a fat transfer to her backside with Dr. Karma GreaserAndrew Gear down in [place]. Surgery is July 30.  She has had a liposuction procedure at this office a few years ago. She is feeling well today, no complaints. Today she is requesting a CBC, BMP, EKG.  Review of Systems  Constitutional: Negative for chills, diaphoresis, fatigue and fever.  HENT: Negative for congestion, postnasal drip, rhinorrhea, sinus pressure, sinus pain and sore throat.   Respiratory: Negative for cough, shortness of breath and wheezing.   Cardiovascular: Negative for chest pain and palpitations.  Psychiatric/Behavioral: Negative for sleep disturbance.    Patient Active Problem List   Diagnosis Date Noted  . Bacterial vaginitis 06/26/2017  . Abnormal uterine bleeding (AUB) 11/07/2014  . Menorrhagia 11/07/2014    Current Outpatient Medications on File Prior to Visit  Medication Sig Dispense Refill  . Cholecalciferol (VITAMIN D3) 2000 UNITS TABS Take 1 tablet by mouth daily.    . hydrochlorothiazide (MICROZIDE) 12.5 MG capsule hydrochlorothiazide 12.5 mg capsule    . LORazepam (ATIVAN) 0.5 MG tablet TAKE 1 TABLET BY MOUTH BY MOUTH AS NEEDED FOR ANXIETY 30 tablet 1  . metoprolol succinate (TOPROL-XL) 25 MG 24 hr tablet Take 1 tablet (25 mg total) by mouth daily. 90 tablet 3  . AMBULATORY NON FORMULARY MEDICATION Boric Acid Suppository 600 mg. Insert 1 PV once daily at bedtime x 30 days (Patient not taking: Reported on 09/14/2017) 30 suppository 5  . azelastine (ASTELIN) 0.1 % nasal spray Place 2 sprays into both nostrils 2 (two) times daily. Use in each nostril as directed (Patient not taking: Reported on 09/23/2017) 30 mL 0  . hydrOXYzine (ATARAX/VISTARIL) 25 MG tablet      No current facility-administered  medications on file prior to visit.     Allergies  Allergen Reactions  . Bactrim [Sulfamethoxazole-Trimethoprim] Anaphylaxis    Chest pain  . Flagyl [Metronidazole]      Objective:  BP 130/84 (BP Location: Left Arm, Patient Position: Sitting, Cuff Size: Large)   Pulse 87   Temp 98.5 F (36.9 C) (Oral)   Resp 16   Ht 5\' 4"  (1.626 m)   Wt 191 lb 9.6 oz (86.9 kg)   LMP 08/24/2017 (Approximate)   SpO2 100%   BMI 32.89 kg/m   Physical Exam  Constitutional: She is oriented to person, place, and time. She appears well-developed and well-nourished. No distress.  Cardiovascular: Normal rate, regular rhythm and normal heart sounds.  Pulmonary/Chest: Effort normal and breath sounds normal. She has no wheezes. She has no rales.  Neurological: She is alert and oriented to person, place, and time.  Skin: Skin is warm and dry.  Psychiatric: Judgment normal.  Vitals reviewed.   Assessment and Plan :  1. Preoperative clearance -Patient presents requesting preop labs -she plans to get a tummy tuck with fat transfer to her backside later this month.  EKG done and faxed to Dr. Bing MatterGear's office today.  Fax results to Dr. Karma GreaserAndrew Gear [phone]. - EKG 12-Lead - CBC with Differential/Platelet - Basic metabolic panel   Marco CollieWhitney Moyinoluwa Dawe, PA-C  Primary Care at [facility] 09/23/2017 5:02 PM

## 2017-09-24 LAB — CBC WITH DIFFERENTIAL/PLATELET
Basophils Absolute: 0 10*3/uL (ref 0.0–0.2)
Basos: 0 %
EOS (ABSOLUTE): 0.3 10*3/uL (ref 0.0–0.4)
Eos: 3 %
Hematocrit: 40.8 % (ref 34.0–46.6)
Hemoglobin: 13.4 g/dL (ref 11.1–15.9)
Immature Grans (Abs): 0.1 10*3/uL (ref 0.0–0.1)
Immature Granulocytes: 1 %
Lymphocytes Absolute: 4.2 10*3/uL — ABNORMAL HIGH (ref 0.7–3.1)
Lymphs: 31 %
MCH: 29.8 pg (ref 26.6–33.0)
MCHC: 32.8 g/dL (ref 31.5–35.7)
MCV: 91 fL (ref 79–97)
Monocytes Absolute: 0.8 10*3/uL (ref 0.1–0.9)
Monocytes: 6 %
Neutrophils Absolute: 8 10*3/uL — ABNORMAL HIGH (ref 1.4–7.0)
Neutrophils: 59 %
Platelets: 381 10*3/uL (ref 150–450)
RBC: 4.5 x10E6/uL (ref 3.77–5.28)
RDW: 13 % (ref 12.3–15.4)
WBC: 13.4 10*3/uL — ABNORMAL HIGH (ref 3.4–10.8)

## 2017-09-24 LAB — BASIC METABOLIC PANEL
BUN/Creatinine Ratio: 14 (ref 9–23)
BUN: 12 mg/dL (ref 6–24)
Creatinine, Ser: 0.84 mg/dL (ref 0.57–1.00)
GFR calc Af Amer: 96 mL/min/{1.73_m2} (ref >59)
GFR calc non Af Amer: 84 mL/min/{1.73_m2} (ref >59)
Potassium: 3.5 mmol/L (ref 3.5–5.2)

## 2017-09-24 LAB — BASIC METABOLIC PANEL WITH GFR
CO2: 24 mmol/L (ref 20–29)
Calcium: 9.7 mg/dL (ref 8.7–10.2)
Chloride: 97 mmol/L (ref 96–106)
Glucose: 89 mg/dL (ref 65–99)
Sodium: 136 mmol/L (ref 134–144)

## 2017-09-24 NOTE — Progress Notes (Signed)
Please fax lab results to Dr. Karma GreaserAndrew Gear [phone]. Thank you!

## 2017-09-25 ENCOUNTER — Telehealth: Payer: Self-pay | Admitting: Physician Assistant

## 2017-09-25 NOTE — Telephone Encounter (Signed)
Copied from CRM [phone]. Topic: Quick Communication - Lab Results >> Sep 25, 2017  8:21 AM Oneal GroutSebastian, Jennifer S wrote: Requesting lab results

## 2017-09-25 NOTE — Telephone Encounter (Signed)
Sending MyChart message re: labs have been faxed to Dr. Vanetta ShawlGear. Her results are visible through MyCHart.

## 2017-10-04 ENCOUNTER — Encounter (HOSPITAL_BASED_OUTPATIENT_CLINIC_OR_DEPARTMENT_OTHER): Payer: Self-pay | Admitting: Emergency Medicine

## 2017-10-04 ENCOUNTER — Other Ambulatory Visit: Payer: Self-pay

## 2017-10-04 ENCOUNTER — Emergency Department (HOSPITAL_BASED_OUTPATIENT_CLINIC_OR_DEPARTMENT_OTHER)
Admission: EM | Admit: 2017-10-04 | Discharge: 2017-10-04 | Disposition: A | Discharge status: Home / Self Care | Payer: BLUE CROSS/BLUE SHIELD | Attending: Emergency Medicine | Admitting: Emergency Medicine

## 2017-10-04 DIAGNOSIS — Z79899 Other long term (current) drug therapy: Secondary | ICD-10-CM | POA: Diagnosis not present

## 2017-10-04 DIAGNOSIS — R05 Cough: Secondary | ICD-10-CM | POA: Insufficient documentation

## 2017-10-04 DIAGNOSIS — J029 Acute pharyngitis, unspecified: Secondary | ICD-10-CM | POA: Diagnosis not present

## 2017-10-04 DIAGNOSIS — I10 Essential (primary) hypertension: Secondary | ICD-10-CM | POA: Diagnosis not present

## 2017-10-04 DIAGNOSIS — R059 Cough, unspecified: Secondary | ICD-10-CM

## 2017-10-04 NOTE — Discharge Instructions (Addendum)
START TAKING CLARITIN OR ZYRTEC DAILY FOR THE NEXT 2-3 WEEKS. IF NO IMPROVEMENT, START TAKING PEPCID OR ZANTAC DAILY. FOLLOW UP WITH PRIMARY CARE PROVIDER.

## 2017-10-04 NOTE — ED Provider Notes (Signed)
[facility] EMERGENCY DEPARTMENT Provider Note   CSN: [id number] Arrival date & time: 10/04/17  1225     History   Chief Complaint Chief Complaint  Patient presents with  . Cough  . Sore Throat    HPI Cathy Cole is a 47 y.o. female.  47 year old female with past medical history including hypertension who presents with cough and sore throat.  Patient began having cough and sore throat almost 1 month ago, states that initially her cough was productive but now it is a dry cough.  He was seen by her PCP on July 1 in July 6 for the same symptoms.  Around July 6 she began having right ear pain where she was previously having left ear pain.  She was diagnosed with virus at PCP office.  She continues to have a dry cough despite taking Mucinex at home.  She denies any shortness of breath, chest pain, fevers, nasal congestion, runny nose, postnasal drip, vomiting, diarrhea, or sick contacts.  She wakes up in the morning with sore throat but denies any bad taste in her mouth in the mornings.  Denies any severe reflux symptoms.  The history is provided by the patient.  Cough   Sore Throat     Past Medical History:  Diagnosis Date  . Abnormal uterine bleeding (AUB) 11/07/2014  . Hypertension   . Menorrhagia 11/07/2014    Patient Active Problem List   Diagnosis Date Noted  . Bacterial vaginitis 06/26/2017  . Abnormal uterine bleeding (AUB) 11/07/2014  . Menorrhagia 11/07/2014    Past Surgical History:  Procedure Laterality Date  . BUNIONECTOMY    . NOVASURE ABLATION N/A 11/08/2014   Procedure: NOVASURE ABLATION;  Surgeon: Sherian Rein, MD;  Location: [facility];  Service: Gynecology;  Laterality: N/A;  . TUBAL LIGATION       OB History   None      Home Medications    Prior to Admission medications   Medication Sig Start Date End Date Taking? Authorizing Provider  benzonatate (TESSALON) 100 MG capsule Take by mouth 3 (three) times daily as needed for  cough.   Yes [provider]  HYDROcodone-homatropine (HYCODAN) 5-1.5 MG/5ML syrup Take 5 mLs by mouth every 8 (eight) hours.   Yes [provider]  Cholecalciferol (VITAMIN D3) 2000 UNITS TABS Take 1 tablet by mouth daily.    [provider]  hydrochlorothiazide (MICROZIDE) 12.5 MG capsule hydrochlorothiazide 12.5 mg capsule 03/26/16   [provider]  LORazepam (ATIVAN) 0.5 MG tablet TAKE 1 TABLET BY MOUTH BY MOUTH AS NEEDED FOR ANXIETY 06/26/17   McVey, Madelaine Bhat, PA-C  metoprolol succinate (TOPROL-XL) 25 MG 24 hr tablet Take 1 tablet (25 mg total) by mouth daily. 06/26/17   McVey, Madelaine Bhat, PA-C    Family History Family History  Problem Relation Age of Onset  . Diabetes Other   . Hypertension Other   . Hypertension Mother     Social History Social History   Tobacco Use  . Smoking status: Never Smoker  . Smokeless tobacco: Never Used  Substance Use Topics  . Alcohol use: No  . Drug use: Never     Allergies   Bactrim [sulfamethoxazole-trimethoprim] and Flagyl [metronidazole]   Review of Systems Review of Systems  Respiratory: Positive for cough.    All other systems reviewed and are negative except that which was mentioned in HPI   Physical Exam Updated Vital Signs BP (!) 134/97 (BP Location: Right Arm)   Pulse  75   Temp 98.8 F (37.1 C) (Oral)   Resp 16   Ht 5\' 4"  (1.626 m)   Wt 86.9 kg (191 lb 9 oz)   LMP 09/20/2017   SpO2 100%   BMI 32.88 kg/m   Physical Exam  Constitutional: She is oriented to person, place, and time. She appears well-developed and well-nourished. No distress.  HENT:  Head: Normocephalic and atraumatic.  Mouth/Throat: Uvula is midline. No oral lesions. No oropharyngeal exudate, posterior oropharyngeal edema or posterior oropharyngeal erythema.  Moist mucous membranes  Eyes: Conjunctivae are normal.  Neck: Neck supple.  Cardiovascular: Normal rate, regular rhythm and normal heart  sounds.  No murmur heard. Pulmonary/Chest: Effort normal and breath sounds normal.  Abdominal: Soft. Bowel sounds are normal. She exhibits no distension. There is no tenderness.  Musculoskeletal: She exhibits no edema.  Neurological: She is alert and oriented to person, place, and time.  Fluent speech  Skin: Skin is warm and dry.  Psychiatric: She has a normal mood and affect. Judgment normal.  Nursing note and vitals reviewed.    ED Treatments / Results  Labs (all labs ordered are listed, but only abnormal results are displayed) Labs Reviewed - No data to display  EKG None  Radiology No results found.  Procedures Procedures (including critical care time)  Medications Ordered in ED Medications - No data to display   Initial Impression / Assessment and Plan / ED Course  I have reviewed the triage vital signs and the nursing notes.     Well-appearing on exam, clear breath sounds, O2 sat 100% on room air.  She did not cough while I was in the room.  I explained that given the chronicity of her cough and sore throat, it is reasonable to consider alternative diagnoses such as seasonal allergies or acid reflux.  Recommended trial of Zyrtec or Claritin for several weeks and if no improvement, trial of Pepcid or Zantac.  Instructed to follow-up with PCP.  Return precautions reviewed.  Final Clinical Impressions(s) / ED Diagnoses   Final diagnoses:  Cough  Sore throat    ED Discharge Orders    None       Elvenia Godden, Ambrose Finlandachel Morgan, MD 10/04/17 1507

## 2017-10-04 NOTE — ED Triage Notes (Signed)
Pt c/o cough and sore throat since June 26th. Pt seen by PCP on July 1st and July 6th for same. Pt reports right ear pain onset July 6th. Pt was told she had viral symptoms. Cough is dry.

## 2018-01-28 ENCOUNTER — Encounter: Payer: BLUE CROSS/BLUE SHIELD | Admitting: Physician Assistant

## 2018-01-29 ENCOUNTER — Telehealth: Payer: Self-pay | Admitting: Emergency Medicine

## 2018-01-29 NOTE — Telephone Encounter (Signed)
Per CRM, pt needed to reschedule. I called and was able to reschedule with Dr. Alvy BimlerSagardia on 11/25 at 9:20. I advised of time, building number and late policy as well as fasting for CPE labs. Pt acknowledged.

## 2018-02-08 ENCOUNTER — Other Ambulatory Visit: Payer: Self-pay

## 2018-02-08 ENCOUNTER — Encounter: Payer: Self-pay | Admitting: Emergency Medicine

## 2018-02-08 ENCOUNTER — Ambulatory Visit (INDEPENDENT_AMBULATORY_CARE_PROVIDER_SITE_OTHER): Payer: BLUE CROSS/BLUE SHIELD | Admitting: Emergency Medicine

## 2018-02-08 VITALS — BP 108/75 | HR 71 | Temp 98.7°F | Resp 16 | Ht 63.5 in | Wt 177.2 lb

## 2018-02-08 DIAGNOSIS — Z130 Encounter for screening for diseases of the blood and blood-forming organs and certain disorders involving the immune mechanism: Secondary | ICD-10-CM

## 2018-02-08 DIAGNOSIS — Z13220 Encounter for screening for lipoid disorders: Secondary | ICD-10-CM

## 2018-02-08 DIAGNOSIS — Z13228 Encounter for screening for other metabolic disorders: Secondary | ICD-10-CM

## 2018-02-08 DIAGNOSIS — I10 Essential (primary) hypertension: Secondary | ICD-10-CM | POA: Diagnosis not present

## 2018-02-08 DIAGNOSIS — Z124 Encounter for screening for malignant neoplasm of cervix: Secondary | ICD-10-CM

## 2018-02-08 DIAGNOSIS — N760 Acute vaginitis: Secondary | ICD-10-CM

## 2018-02-08 DIAGNOSIS — B9689 Other specified bacterial agents as the cause of diseases classified elsewhere: Secondary | ICD-10-CM

## 2018-02-08 DIAGNOSIS — B373 Candidiasis of vulva and vagina: Secondary | ICD-10-CM | POA: Diagnosis not present

## 2018-02-08 DIAGNOSIS — N898 Other specified noninflammatory disorders of vagina: Secondary | ICD-10-CM | POA: Diagnosis not present

## 2018-02-08 DIAGNOSIS — Z1329 Encounter for screening for other suspected endocrine disorder: Secondary | ICD-10-CM | POA: Diagnosis not present

## 2018-02-08 DIAGNOSIS — B3731 Acute candidiasis of vulva and vagina: Secondary | ICD-10-CM

## 2018-02-08 DIAGNOSIS — E559 Vitamin D deficiency, unspecified: Secondary | ICD-10-CM

## 2018-02-08 DIAGNOSIS — Z0001 Encounter for general adult medical examination with abnormal findings: Secondary | ICD-10-CM | POA: Diagnosis not present

## 2018-02-08 LAB — POCT URINALYSIS DIP (MANUAL ENTRY)
BILIRUBIN UA: NEGATIVE
Glucose, UA: NEGATIVE mg/dL
Ketones, POC UA: NEGATIVE mg/dL
LEUKOCYTES UA: NEGATIVE
Nitrite, UA: NEGATIVE
PROTEIN UA: NEGATIVE mg/dL
Spec Grav, UA: 1.025 (ref 1.010–1.025)
Urobilinogen, UA: 0.2 E.U./dL
pH, UA: 5.5 (ref 5.0–8.0)

## 2018-02-08 LAB — POCT URINE PREGNANCY: PREG TEST UR: NEGATIVE

## 2018-02-08 LAB — POCT WET + KOH PREP: Trich by wet prep: ABSENT

## 2018-02-08 MED ORDER — CLINDAMYCIN HCL 300 MG PO CAPS: 300.0000 mg | ORAL_CAPSULE | Freq: Two times a day (BID) | ORAL | 0 refills | Status: AC

## 2018-02-08 MED ORDER — FLUCONAZOLE 150 MG PO TABS: 150.0000 mg | ORAL_TABLET | Freq: Once | ORAL | 0 refills | Status: AC

## 2018-02-08 NOTE — Addendum Note (Signed)
Addended by: Evie LacksSAGARDIA, Sibbie Flammia J on: 02/08/2018 11:06 AM   Modules accepted: Orders

## 2018-02-08 NOTE — Patient Instructions (Addendum)
If you have lab work done today you will be contacted with your lab results within the next 2 weeks.  If you have not heard from [place] then please contact us. The fastest way to get your results is to register for My Chart.   IF you received an x-ray today, you will receive an invoice from [facility]. Please contact [facility] at [phone] with questions or concerns regarding your invoice.   IF you received labwork today, you will receive an invoice from [place]. Please contact [facility] at [phone] with questions or concerns regarding your invoice.   Our billing staff will not be able to assist you with questions regarding bills from these companies.  You will be contacted with the lab results as soon as they are available. The fastest way to get your results is to activate your My Chart account. Instructions are located on the last page of this paperwork. If you have not heard from [place] regarding the results in 2 weeks, please contact this office.     Health Maintenance, Female Adopting a healthy lifestyle and getting preventive care can go a long way to promote health and wellness. Talk with your health care provider about what schedule of regular examinations is right for you. This is a good chance for you to check in with your provider about disease prevention and staying healthy. In between checkups, there are plenty of things you can do on your own. Experts have done a lot of research about which lifestyle changes and preventive measures are most likely to keep you healthy. Ask your health care provider for more information. Weight and diet Eat a healthy diet  Be sure to include plenty of vegetables, fruits, low-fat dairy products, and lean protein.  Do not eat a lot of foods high in solid fats, added sugars, or salt.  Get regular exercise. This is one of the most important things you can do for your health. ? Most adults should exercise for at least 150  minutes each week. The exercise should increase your heart rate and make you sweat (moderate-intensity exercise). ? Most adults should also do strengthening exercises at least twice a week. This is in addition to the moderate-intensity exercise.  Maintain a healthy weight  Body mass index (BMI) is a measurement that can be used to identify possible weight problems. It estimates body fat based on height and weight. Your health care provider can help determine your BMI and help you achieve or maintain a healthy weight.  For females 22 years of age and older: ? A BMI below 18.5 is considered underweight. ? A BMI of 18.5 to 24.9 is normal. ? A BMI of 25 to 29.9 is considered overweight. ? A BMI of 30 and above is considered obese.  Watch levels of cholesterol and blood lipids  You should start having your blood tested for lipids and cholesterol at 46 years of age, then have this test every 5 years.  You may need to have your cholesterol levels checked more often if: ? Your lipid or cholesterol levels are high. ? You are older than 46 years of age. ? You are at high risk for heart disease.  Cancer screening Lung Cancer  Lung cancer screening is recommended for adults 58-34 years old who are at high risk for lung cancer because of a history of smoking.  A yearly low-dose CT scan of the lungs is recommended for people who: ? Currently smoke. ? Have quit  within the past 15 years. ? Have at least a 30-pack-year history of smoking. A pack year is smoking an average of one pack of cigarettes a day for 1 year.  Yearly screening should continue until it has been 15 years since you quit.  Yearly screening should stop if you develop a health problem that would prevent you from having lung cancer treatment.  Breast Cancer  Practice breast self-awareness. This means understanding how your breasts normally appear and feel.  It also means doing regular breast self-exams. Let your health care  provider know about any changes, no matter how small.  If you are in your 20s or 30s, you should have a clinical breast exam (CBE) by a health care provider every 1-3 years as part of a regular health exam.  If you are 48 or older, have a CBE every year. Also consider having a breast X-ray (mammogram) every year.  If you have a family history of breast cancer, talk to your health care provider about genetic screening.  If you are at high risk for breast cancer, talk to your health care provider about having an MRI and a mammogram every year.  Breast cancer gene (BRCA) assessment is recommended for women who have family members with BRCA-related cancers. BRCA-related cancers include: ? Breast. ? Ovarian. ? Tubal. ? Peritoneal cancers.  Results of the assessment will determine the need for genetic counseling and BRCA1 and BRCA2 testing.  Cervical Cancer Your health care provider may recommend that you be screened regularly for cancer of the pelvic organs (ovaries, uterus, and vagina). This screening involves a pelvic examination, including checking for microscopic changes to the surface of your cervix (Pap test). You may be encouraged to have this screening done every 3 years, beginning at age 8.  For women ages 8-65, health care providers may recommend pelvic exams and Pap testing every 3 years, or they may recommend the Pap and pelvic exam, combined with testing for human papilloma virus (HPV), every 5 years. Some types of HPV increase your risk of cervical cancer. Testing for HPV may also be done on women of any age with unclear Pap test results.  Other health care providers may not recommend any screening for nonpregnant women who are considered low risk for pelvic cancer and who do not have symptoms. Ask your health care provider if a screening pelvic exam is right for you.  If you have had past treatment for cervical cancer or a condition that could lead to cancer, you need Pap tests  and screening for cancer for at least 20 years after your treatment. If Pap tests have been discontinued, your risk factors (such as having a new sexual partner) need to be reassessed to determine if screening should resume. Some women have medical problems that increase the chance of getting cervical cancer. In these cases, your health care provider may recommend more frequent screening and Pap tests.  Colorectal Cancer  This type of cancer can be detected and often prevented.  Routine colorectal cancer screening usually begins at 46 years of age and continues through 46 years of age.  Your health care provider may recommend screening at an earlier age if you have risk factors for colon cancer.  Your health care provider may also recommend using home test kits to check for hidden blood in the stool.  A small camera at the end of a tube can be used to examine your colon directly (sigmoidoscopy or colonoscopy). This is done to check  for the earliest forms of colorectal cancer.  Routine screening usually begins at age 75.  Direct examination of the colon should be repeated every 5-10 years through 46 years of age. However, you may need to be screened more often if early forms of precancerous polyps or small growths are found.  Skin Cancer  Check your skin from head to toe regularly.  Tell your health care provider about any new moles or changes in moles, especially if there is a change in a mole's shape or color.  Also tell your health care provider if you have a mole that is larger than the size of a pencil eraser.  Always use sunscreen. Apply sunscreen liberally and repeatedly throughout the day.  Protect yourself by wearing long sleeves, pants, a wide-brimmed hat, and sunglasses whenever you are outside.  Heart disease, diabetes, and high blood pressure  High blood pressure causes heart disease and increases the risk of stroke. High blood pressure is more likely to develop  in: ? People who have blood pressure in the high end of the normal range (130-139/85-89 mm Hg). ? People who are overweight or obese. ? People who are African American.  If you are 41-67 years of age, have your blood pressure checked every 3-5 years. If you are 32 years of age or older, have your blood pressure checked every year. You should have your blood pressure measured twice-once when you are at a hospital or clinic, and once when you are not at a hospital or clinic. Record the average of the two measurements. To check your blood pressure when you are not at a hospital or clinic, you can use: ? An automated blood pressure machine at a pharmacy. ? A home blood pressure monitor.  If you are between 44 years and 70 years old, ask your health care provider if you should take aspirin to prevent strokes.  Have regular diabetes screenings. This involves taking a blood sample to check your fasting blood sugar level. ? If you are at a normal weight and have a low risk for diabetes, have this test once every three years after 46 years of age. ? If you are overweight and have a high risk for diabetes, consider being tested at a younger age or more often. Preventing infection Hepatitis B  If you have a higher risk for hepatitis B, you should be screened for this virus. You are considered at high risk for hepatitis B if: ? You were born in a country where hepatitis B is common. Ask your health care provider which countries are considered high risk. ? Your parents were born in a high-risk country, and you have not been immunized against hepatitis B (hepatitis B vaccine). ? You have HIV or AIDS. ? You use needles to inject street drugs. ? You live with someone who has hepatitis B. ? You have had sex with someone who has hepatitis B. ? You get hemodialysis treatment. ? You take certain medicines for conditions, including cancer, organ transplantation, and autoimmune conditions.  Hepatitis C  Blood  testing is recommended for: ? Everyone born from [birth year] through 1964. ? Anyone with known risk factors for hepatitis C.  Sexually transmitted infections (STIs)  You should be screened for sexually transmitted infections (STIs) including gonorrhea and chlamydia if: ? You are sexually active and are younger than 46 years of age. ? You are older than 46 years of age and your health care provider tells you that you are at risk for  this type of infection. ? Your sexual activity has changed since you were last screened and you are at an increased risk for chlamydia or gonorrhea. Ask your health care provider if you are at risk.  If you do not have HIV, but are at risk, it may be recommended that you take a prescription medicine daily to prevent HIV infection. This is called pre-exposure prophylaxis (PrEP). You are considered at risk if: ? You are sexually active and do not regularly use condoms or know the HIV status of your partner(s). ? You take drugs by injection. ? You are sexually active with a partner who has HIV.  Talk with your health care provider about whether you are at high risk of being infected with HIV. If you choose to begin PrEP, you should first be tested for HIV. You should then be tested every 3 months for as long as you are taking PrEP. Pregnancy  If you are premenopausal and you may become pregnant, ask your health care provider about preconception counseling.  If you may become pregnant, take 400 to 800 micrograms (mcg) of folic acid every day.  If you want to prevent pregnancy, talk to your health care provider about birth control (contraception). Osteoporosis and menopause  Osteoporosis is a disease in which the bones lose minerals and strength with aging. This can result in serious bone fractures. Your risk for osteoporosis can be identified using a bone density scan.  If you are 43 years of age or older, or if you are at risk for osteoporosis and fractures, ask your  health care provider if you should be screened.  Ask your health care provider whether you should take a calcium or vitamin D supplement to lower your risk for osteoporosis.  Menopause may have certain physical symptoms and risks.  Hormone replacement therapy may reduce some of these symptoms and risks. Talk to your health care provider about whether hormone replacement therapy is right for you. Follow these instructions at home:  Schedule regular health, dental, and eye exams.  Stay current with your immunizations.  Do not use any tobacco products including cigarettes, chewing tobacco, or electronic cigarettes.  If you are pregnant, do not drink alcohol.  If you are breastfeeding, limit how much and how often you drink alcohol.  Limit alcohol intake to no more than 1 drink per day for nonpregnant women. One drink equals 12 ounces of beer, 5 ounces of wine, or 1 ounces of hard liquor.  Do not use street drugs.  Do not share needles.  Ask your health care provider for help if you need support or information about quitting drugs.  Tell your health care provider if you often feel depressed.  Tell your health care provider if you have ever been abused or do not feel safe at home. This information is not intended to replace advice given to you by your health care provider. Make sure you discuss any questions you have with your health care provider. Document Released: 09/16/2010 Document Revised: 08/09/2015 Document Reviewed: 12/05/2014 Elsevier Interactive Patient Education  2018 [employer]  Bacterial Vaginosis Bacterial vaginosis is an infection of the vagina. It happens when too many germs (bacteria) grow in the vagina. This infection puts you at risk for infections from sex (STIs). Treating this infection can lower your risk for some STIs. You should also treat this if you are pregnant. It can cause your baby to be born early. Follow these instructions at  home: Medicines  Take over-the-counter  and prescription medicines only as told by your doctor.  Take or use your antibiotic medicine as told by your doctor. Do not stop taking or using it even if you start to feel better. General instructions  If you your sexual partner is a woman, tell her that you have this infection. She needs to get treatment if she has symptoms. If you have a female partner, he does not need to be treated.  During treatment: ? Avoid sex. ? Do not douche. ? Avoid alcohol as told. ? Avoid breastfeeding as told.  Drink enough fluid to keep your pee (urine) clear or pale yellow.  Keep your vagina and butt (rectum) clean. ? Wash the area with warm water every day. ? Wipe from front to back after you use the toilet.  Keep all follow-up visits as told by your doctor. This is important. Preventing this condition  Do not douche.  Use only warm water to wash around your vagina.  Use protection when you have sex. This includes: ? Latex condoms. ? Dental dams.  Limit how many people you have sex with. It is best to only have sex with the same person (be monogamous).  Get tested for STIs. Have your partner get tested.  Wear underwear that is cotton or lined with cotton.  Avoid tight pants and pantyhose. This is most important in summer.  Do not use any products that have nicotine or tobacco in them. These include cigarettes and e-cigarettes. If you need help quitting, ask your doctor.  Do not use illegal drugs.  Limit how much alcohol you drink. Contact a doctor if:  Your symptoms do not get better, even after you are treated.  You have more discharge or pain when you pee (urinate).  You have a fever.  You have pain in your belly (abdomen).  You have pain with sex.  Your bleed from your vagina between periods. Summary  This infection happens when too many germs (bacteria) grow in the vagina.  Treating this condition can lower your risk for some  infections from sex (STIs).  You should also treat this if you are pregnant. It can cause early (premature) birth.  Do not stop taking or using your antibiotic medicine even if you start to feel better. This information is not intended to replace advice given to you by your health care provider. Make sure you discuss any questions you have with your health care provider. Document Released: 12/11/2007 Document Revised: 11/17/2015 Document Reviewed: 11/17/2015 Elsevier Interactive Patient Education  2017 [employer].  Vaginal Yeast infection, Adult Vaginal yeast infection is a condition that causes soreness, swelling, and redness (inflammation) of the vagina. It also causes vaginal discharge. This is a common condition. Some women get this infection frequently. What are the causes? This condition is caused by a change in the normal balance of the yeast (candida) and bacteria that live in the vagina. This change causes an overgrowth of yeast, which causes the inflammation. What increases the risk? This condition is more likely to develop in:  Women who take antibiotic medicines.  Women who have diabetes.  Women who take birth control pills.  Women who are pregnant.  Women who douche often.  Women who have a weak defense (immune) system.  Women who have been taking steroid medicines for a long time.  Women who frequently wear tight clothing.  What are the signs or symptoms? Symptoms of this condition include:  White, thick vaginal discharge.  Swelling, itching, redness, and  irritation of the vagina. The lips of the vagina (vulva) may be affected as well.  Pain or a burning feeling while urinating.  Pain during sex.  How is this diagnosed? This condition is diagnosed with a medical history and physical exam. This will include a pelvic exam. Your health care provider will examine a sample of your vaginal discharge under a microscope. Your health care provider may send this  sample for testing to confirm the diagnosis. How is this treated? This condition is treated with medicine. Medicines may be over-the-counter or prescription. You may be told to use one or more of the following:  Medicine that is taken orally.  Medicine that is applied as a cream.  Medicine that is inserted directly into the vagina (suppository).  Follow these instructions at home:  Take or apply over-the-counter and prescription medicines only as told by your health care provider.  Do not have sex until your health care provider has approved. Tell your sex partner that you have a yeast infection. That person should go to his or her health care provider if he or she develops symptoms.  Do not wear tight clothes, such as pantyhose or tight pants.  Avoid using tampons until your health care provider approves.  Eat more yogurt. This may help to keep your yeast infection from returning.  Try taking a sitz bath to help with discomfort. This is a warm water bath that is taken while you are sitting down. The water should only come up to your hips and should cover your buttocks. Do this 3-4 times per day or as told by your health care provider.  Do not douche.  Wear breathable, cotton underwear.  If you have diabetes, keep your blood sugar levels under control. Contact a health care provider if:  You have a fever.  Your symptoms go away and then return.  Your symptoms do not get better with treatment.  Your symptoms get worse.  You have new symptoms.  You develop blisters in or around your vagina.  You have blood coming from your vagina and it is not your menstrual period.  You develop pain in your abdomen. This information is not intended to replace advice given to you by your health care provider. Make sure you discuss any questions you have with your health care provider. Document Released: 12/11/2004 Document Revised: 08/15/2015 Document Reviewed: 09/04/2014 Elsevier  Interactive Patient Education  2018 [employer].

## 2018-02-08 NOTE — Progress Notes (Signed)
Cathy Cole 47 y.o.   Chief Complaint  Patient presents with  . Annual Exam  . Vaginal Discharge    with itching x 4 days    HISTORY OF PRESENT ILLNESS: This is a 47 y.o. female here for annual exam. Has a history of hypertension and presently taking Toprol and hydrochlorothiazide.  Doing well. Non-smoker.  No EtOH user. Complaining of vaginal discharge for the past 4 days as well as vaginal itching.  Has a history of atrial vaginosis in the past.  Penicillin for 7 days last month for dental procedure.  No other significant symptoms or complaints. BP Readings from Last 3 Encounters:  02/08/18 108/75  10/04/17 (!) 134/97  09/23/17 130/84    HPI   Prior to Admission medications   Medication Sig Start Date End Date Taking? Authorizing Provider  Cholecalciferol (VITAMIN D3) 2000 UNITS TABS Take 1 tablet by mouth daily.   Yes [provider]  hydrochlorothiazide (MICROZIDE) 12.5 MG capsule hydrochlorothiazide 12.5 mg capsule 03/26/16  Yes [provider]  LORazepam (ATIVAN) 0.5 MG tablet TAKE 1 TABLET BY MOUTH BY MOUTH AS NEEDED FOR ANXIETY 06/26/17  Yes McVey, Gelene Mink, PA-C  metoprolol succinate (TOPROL-XL) 25 MG 24 hr tablet Take 1 tablet (25 mg total) by mouth daily. 06/26/17  Yes McVey, Gelene Mink, PA-C  benzonatate (TESSALON) 100 MG capsule Take by mouth 3 (three) times daily as needed for cough.    [provider]  HYDROcodone-homatropine (HYCODAN) 5-1.5 MG/5ML syrup Take 5 mLs by mouth every 8 (eight) hours.    [provider]    Allergies  Allergen Reactions  . Bactrim [Sulfamethoxazole-Trimethoprim] Anaphylaxis    Chest pain  . Flagyl [Metronidazole]     Patient Active Problem List   Diagnosis Date Noted  . Bacterial vaginitis 06/26/2017  . Abnormal uterine bleeding (AUB) 11/07/2014  . Menorrhagia 11/07/2014    Past Medical History:  Diagnosis Date  . Abnormal uterine bleeding (AUB) 11/07/2014  .  Hypertension   . Menorrhagia 11/07/2014    Past Surgical History:  Procedure Laterality Date  . BUNIONECTOMY    . NOVASURE ABLATION N/A 11/08/2014   Procedure: NOVASURE ABLATION;  Surgeon: Janyth Contes, MD;  Location: [facility];  Service: Gynecology;  Laterality: N/A;  . TUBAL LIGATION      Social History   Socioeconomic History  . Marital status: Divorced    Spouse name: Not on file  . Number of children: 3  . Years of education: Not on file  . Highest education level: Not on file  Occupational History  . Not on file  Social Needs  . Financial resource strain: Not on file  . Food insecurity:    Worry: Not on file    Inability: Not on file  . Transportation needs:    Medical: Not on file    Non-medical: Not on file  Tobacco Use  . Smoking status: Never Smoker  . Smokeless tobacco: Never Used  Substance and Sexual Activity  . Alcohol use: No  . Drug use: Never  . Sexual activity: Yes  Lifestyle  . Physical activity:    Days per week: Not on file    Minutes per session: Not on file  . Stress: Not on file  Relationships  . Social connections:    Talks on phone: Not on file    Gets together: Not on file    Attends religious service: Not on file    Active member of club or organization: Not on  file    Attends meetings of clubs or organizations: Not on file    Relationship status: Not on file  . Intimate partner violence:    Fear of current or ex partner: Not on file    Emotionally abused: Not on file    Physically abused: Not on file    Forced sexual activity: Not on file  Other Topics Concern  . Not on file  Social History Narrative  . Not on file    Family History  Problem Relation Age of Onset  . Diabetes Other   . Hypertension Other   . Hypertension Mother      Review of Systems  Constitutional: Negative.  Negative for chills and fever.  HENT: Negative.  Negative for sore throat.   Eyes: Negative.  Negative for blurred vision and double  vision.  Respiratory: Negative.  Negative for cough and shortness of breath.   Cardiovascular: Negative.  Negative for chest pain and palpitations.  Gastrointestinal: Negative.  Negative for abdominal pain, blood in stool, diarrhea, melena, nausea and vomiting.  Genitourinary: Negative for dysuria, frequency and hematuria.       Vaginal discharge and itching  Musculoskeletal: Negative.  Negative for back pain, myalgias and neck pain.  Skin: Negative.  Negative for rash.  Neurological: Negative.  Negative for dizziness and headaches.  Endo/Heme/Allergies: Negative.   All other systems reviewed and are negative.  Vitals:   02/08/18 0956  BP: 108/75  Pulse: 71  Resp: 16  Temp: 98.7 F (37.1 C)  SpO2: 100%     Physical Exam  Constitutional: She is oriented to person, place, and time. She appears well-developed and well-nourished.  HENT:  Head: Normocephalic and atraumatic.  Right Ear: External ear normal.  Left Ear: External ear normal.  Nose: Nose normal.  Mouth/Throat: Oropharynx is clear and moist. No oropharyngeal exudate.  Eyes: Pupils are equal, round, and reactive to light. Conjunctivae and EOM are normal.  Neck: Normal range of motion. Neck supple. No JVD present. No thyromegaly present.  Cardiovascular: Normal rate, regular rhythm and normal heart sounds.  Pulmonary/Chest: Effort normal and breath sounds normal.  Abdominal: Soft. Bowel sounds are normal. She exhibits no distension. There is no tenderness. Hernia confirmed negative in the right inguinal area and confirmed negative in the left inguinal area.  Genitourinary: There is no rash, tenderness or lesion on the right labia. There is no rash, tenderness or lesion on the left labia. Cervix exhibits no motion tenderness, no discharge and no friability. Right adnexum displays no mass and no tenderness. Left adnexum displays no mass and no tenderness. No erythema, tenderness or bleeding in the vagina. No foreign body in the  vagina. Vaginal discharge found.  Musculoskeletal: Normal range of motion.  Lymphadenopathy:    She has no cervical adenopathy. No inguinal adenopathy noted on the right or left side.  Neurological: She is alert and oriented to person, place, and time. No sensory deficit. She exhibits normal muscle tone. Coordination normal.  Skin: Skin is warm and dry. Capillary refill takes less than 2 seconds.  Psychiatric: She has a normal mood and affect. Her behavior is normal.  Vitals reviewed.  Results for orders placed or performed in visit on 02/08/18 (from the past 24 hour(s))  POCT urinalysis dipstick     Status: Abnormal   Collection Time: 02/08/18 10:12 AM  Result Value Ref Range   Color, UA yellow yellow   Clarity, UA clear clear   Glucose, UA negative negative mg/dL  Bilirubin, UA negative negative   Ketones, POC UA negative negative mg/dL   Spec Grav, UA 1.025 1.010 - 1.025   Blood, UA small (A) negative   pH, UA 5.5 5.0 - 8.0   Protein Ur, POC negative negative mg/dL   Urobilinogen, UA 0.2 0.2 or 1.0 E.U./dL   Nitrite, UA Negative Negative   Leukocytes, UA Negative Negative  POCT urine pregnancy     Status: None   Collection Time: 02/08/18 10:18 AM  Result Value Ref Range   Preg Test, Ur Negative Negative  POCT Wet + KOH Prep     Status: Abnormal   Collection Time: 02/08/18 10:49 AM  Result Value Ref Range   Yeast by KOH Present (A) Absent   Yeast by wet prep Present (A) Absent   WBC by wet prep Moderate (A) Few   Clue Cells Wet Prep HPF POC Few (A) None   Trich by wet prep Absent Absent   Bacteria Wet Prep HPF POC Few Few   Epithelial Cells By [employer] ([facility]) Many (A) None, Few, Too numerous to count   RBC,UR,HPF,POC None None RBC/hpf     ASSESSMENT & PLAN: Meylin was seen today for annual exam and vaginal discharge.  Diagnoses and all orders for this visit:  Encounter for general adult medical examination with abnormal findings  Vaginal discharge -     POCT  urinalysis dipstick -     POCT urine pregnancy -     POCT Wet + KOH Prep -     Urine Culture  Screening for lipoid disorders -     Lipid panel  Screening for endocrine, metabolic and immunity disorder -     CBC with Differential -     Comprehensive metabolic panel -     Lipid panel  Cervical cancer screening -     Pap IG and HPV (high risk) DNA detection  Essential hypertension  Bacterial vaginosis -     clindamycin (CLEOCIN) 300 MG capsule; Take 1 capsule (300 mg total) by mouth 2 (two) times daily for 7 days.  Yeast vaginitis -     fluconazole (DIFLUCAN) 150 MG tablet; Take 1 tablet (150 mg total) by mouth once for 1 dose.    Patient Instructions       If you have lab work done today you will be contacted with your lab results within the next 2 weeks.  If you have not heard from [place] then please contact us. The fastest way to get your results is to register for My Chart.   IF you received an x-ray today, you will receive an invoice from [facility]. Please contact [facility] at [phone] with questions or concerns regarding your invoice.   IF you received labwork today, you will receive an invoice from [facility]. Please contact [facility] at [phone] with questions or concerns regarding your invoice.   Our billing staff will not be able to assist you with questions regarding bills from these companies.  You will be contacted with the lab results as soon as they are available. The fastest way to get your results is to activate your My Chart account. Instructions are located on the last page of this paperwork. If you have not heard from [place] regarding the results in 2 weeks, please contact this office.     Health Maintenance, Female Adopting a healthy lifestyle and getting preventive care can go a long way to promote health and wellness. Talk with your health care provider about  what schedule of regular examinations is right for you. This is a good  chance for you to check in with your provider about disease prevention and staying healthy. In between checkups, there are plenty of things you can do on your own. Experts have done a lot of research about which lifestyle changes and preventive measures are most likely to keep you healthy. Ask your health care provider for more information. Weight and diet Eat a healthy diet  Be sure to include plenty of vegetables, fruits, low-fat dairy products, and lean protein.  Do not eat a lot of foods high in solid fats, added sugars, or salt.  Get regular exercise. This is one of the most important things you can do for your health. ? Most adults should exercise for at least 150 minutes each week. The exercise should increase your heart rate and make you sweat (moderate-intensity exercise). ? Most adults should also do strengthening exercises at least twice a week. This is in addition to the moderate-intensity exercise.  Maintain a healthy weight  Body mass index (BMI) is a measurement that can be used to identify possible weight problems. It estimates body fat based on height and weight. Your health care provider can help determine your BMI and help you achieve or maintain a healthy weight.  For females 66 years of age and older: ? A BMI below 18.5 is considered underweight. ? A BMI of 18.5 to 24.9 is normal. ? A BMI of 25 to 29.9 is considered overweight. ? A BMI of 30 and above is considered obese.  Watch levels of cholesterol and blood lipids  You should start having your blood tested for lipids and cholesterol at 46 years of age, then have this test every 5 years.  You may need to have your cholesterol levels checked more often if: ? Your lipid or cholesterol levels are high. ? You are older than 47 years of age. ? You are at high risk for heart disease.  Cancer screening Lung Cancer  Lung cancer screening is recommended for adults 60-26 years old who are at high risk for lung cancer  because of a history of smoking.  A yearly low-dose CT scan of the lungs is recommended for people who: ? Currently smoke. ? Have quit within the past 15 years. ? Have at least a 30-pack-year history of smoking. A pack year is smoking an average of one pack of cigarettes a day for 1 year.  Yearly screening should continue until it has been 15 years since you quit.  Yearly screening should stop if you develop a health problem that would prevent you from having lung cancer treatment.  Breast Cancer  Practice breast self-awareness. This means understanding how your breasts normally appear and feel.  It also means doing regular breast self-exams. Let your health care provider know about any changes, no matter how small.  If you are in your 20s or 30s, you should have a clinical breast exam (CBE) by a health care provider every 1-3 years as part of a regular health exam.  If you are 64 or older, have a CBE every year. Also consider having a breast X-ray (mammogram) every year.  If you have a family history of breast cancer, talk to your health care provider about genetic screening.  If you are at high risk for breast cancer, talk to your health care provider about having an MRI and a mammogram every year.  Breast cancer gene (BRCA) assessment is recommended for  women who have family members with BRCA-related cancers. BRCA-related cancers include: ? Breast. ? Ovarian. ? Tubal. ? Peritoneal cancers.  Results of the assessment will determine the need for genetic counseling and BRCA1 and BRCA2 testing.  Cervical Cancer Your health care provider may recommend that you be screened regularly for cancer of the pelvic organs (ovaries, uterus, and vagina). This screening involves a pelvic examination, including checking for microscopic changes to the surface of your cervix (Pap test). You may be encouraged to have this screening done every 3 years, beginning at age 72.  For women ages 84-65,  health care providers may recommend pelvic exams and Pap testing every 3 years, or they may recommend the Pap and pelvic exam, combined with testing for human papilloma virus (HPV), every 5 years. Some types of HPV increase your risk of cervical cancer. Testing for HPV may also be done on women of any age with unclear Pap test results.  Other health care providers may not recommend any screening for nonpregnant women who are considered low risk for pelvic cancer and who do not have symptoms. Ask your health care provider if a screening pelvic exam is right for you.  If you have had past treatment for cervical cancer or a condition that could lead to cancer, you need Pap tests and screening for cancer for at least 20 years after your treatment. If Pap tests have been discontinued, your risk factors (such as having a new sexual partner) need to be reassessed to determine if screening should resume. Some women have medical problems that increase the chance of getting cervical cancer. In these cases, your health care provider may recommend more frequent screening and Pap tests.  Colorectal Cancer  This type of cancer can be detected and often prevented.  Routine colorectal cancer screening usually begins at 46 years of age and continues through 46 years of age.  Your health care provider may recommend screening at an earlier age if you have risk factors for colon cancer.  Your health care provider may also recommend using home test kits to check for hidden blood in the stool.  A small camera at the end of a tube can be used to examine your colon directly (sigmoidoscopy or colonoscopy). This is done to check for the earliest forms of colorectal cancer.  Routine screening usually begins at age 61.  Direct examination of the colon should be repeated every 5-10 years through 46 years of age. However, you may need to be screened more often if early forms of precancerous polyps or small growths are  found.  Skin Cancer  Check your skin from head to toe regularly.  Tell your health care provider about any new moles or changes in moles, especially if there is a change in a mole's shape or color.  Also tell your health care provider if you have a mole that is larger than the size of a pencil eraser.  Always use sunscreen. Apply sunscreen liberally and repeatedly throughout the day.  Protect yourself by wearing long sleeves, pants, a wide-brimmed hat, and sunglasses whenever you are outside.  Heart disease, diabetes, and high blood pressure  High blood pressure causes heart disease and increases the risk of stroke. High blood pressure is more likely to develop in: ? People who have blood pressure in the high end of the normal range (130-139/85-89 mm Hg). ? People who are overweight or obese. ? People who are African American.  If you are 41-63 years of age,  have your blood pressure checked every 3-5 years. If you are 31 years of age or older, have your blood pressure checked every year. You should have your blood pressure measured twice-once when you are at a hospital or clinic, and once when you are not at a hospital or clinic. Record the average of the two measurements. To check your blood pressure when you are not at a hospital or clinic, you can use: ? An automated blood pressure machine at a pharmacy. ? A home blood pressure monitor.  If you are between 13 years and 44 years old, ask your health care provider if you should take aspirin to prevent strokes.  Have regular diabetes screenings. This involves taking a blood sample to check your fasting blood sugar level. ? If you are at a normal weight and have a low risk for diabetes, have this test once every three years after 46 years of age. ? If you are overweight and have a high risk for diabetes, consider being tested at a younger age or more often. Preventing infection Hepatitis B  If you have a higher risk for hepatitis B,  you should be screened for this virus. You are considered at high risk for hepatitis B if: ? You were born in a country where hepatitis B is common. Ask your health care provider which countries are considered high risk. ? Your parents were born in a high-risk country, and you have not been immunized against hepatitis B (hepatitis B vaccine). ? You have HIV or AIDS. ? You use needles to inject street drugs. ? You live with someone who has hepatitis B. ? You have had sex with someone who has hepatitis B. ? You get hemodialysis treatment. ? You take certain medicines for conditions, including cancer, organ transplantation, and autoimmune conditions.  Hepatitis C  Blood testing is recommended for: ? Everyone born from [birth year] through 1964. ? Anyone with known risk factors for hepatitis C.  Sexually transmitted infections (STIs)  You should be screened for sexually transmitted infections (STIs) including gonorrhea and chlamydia if: ? You are sexually active and are younger than 46 years of age. ? You are older than 46 years of age and your health care provider tells you that you are at risk for this type of infection. ? Your sexual activity has changed since you were last screened and you are at an increased risk for chlamydia or gonorrhea. Ask your health care provider if you are at risk.  If you do not have HIV, but are at risk, it may be recommended that you take a prescription medicine daily to prevent HIV infection. This is called pre-exposure prophylaxis (PrEP). You are considered at risk if: ? You are sexually active and do not regularly use condoms or know the HIV status of your partner(s). ? You take drugs by injection. ? You are sexually active with a partner who has HIV.  Talk with your health care provider about whether you are at high risk of being infected with HIV. If you choose to begin PrEP, you should first be tested for HIV. You should then be tested every 3 months for as long  as you are taking PrEP. Pregnancy  If you are premenopausal and you may become pregnant, ask your health care provider about preconception counseling.  If you may become pregnant, take 400 to 800 micrograms (mcg) of folic acid every day.  If you want to prevent pregnancy, talk to your health care provider about birth  control (contraception). Osteoporosis and menopause  Osteoporosis is a disease in which the bones lose minerals and strength with aging. This can result in serious bone fractures. Your risk for osteoporosis can be identified using a bone density scan.  If you are 41 years of age or older, or if you are at risk for osteoporosis and fractures, ask your health care provider if you should be screened.  Ask your health care provider whether you should take a calcium or vitamin D supplement to lower your risk for osteoporosis.  Menopause may have certain physical symptoms and risks.  Hormone replacement therapy may reduce some of these symptoms and risks. Talk to your health care provider about whether hormone replacement therapy is right for you. Follow these instructions at home:  Schedule regular health, dental, and eye exams.  Stay current with your immunizations.  Do not use any tobacco products including cigarettes, chewing tobacco, or electronic cigarettes.  If you are pregnant, do not drink alcohol.  If you are breastfeeding, limit how much and how often you drink alcohol.  Limit alcohol intake to no more than 1 drink per day for nonpregnant women. One drink equals 12 ounces of beer, 5 ounces of wine, or 1 ounces of hard liquor.  Do not use street drugs.  Do not share needles.  Ask your health care provider for help if you need support or information about quitting drugs.  Tell your health care provider if you often feel depressed.  Tell your health care provider if you have ever been abused or do not feel safe at home. This information is not intended to  replace advice given to you by your health care provider. Make sure you discuss any questions you have with your health care provider. Document Released: 09/16/2010 Document Revised: 08/09/2015 Document Reviewed: 12/05/2014 Elsevier Interactive Patient Education  2018 [employer]  Bacterial Vaginosis Bacterial vaginosis is an infection of the vagina. It happens when too many germs (bacteria) grow in the vagina. This infection puts you at risk for infections from sex (STIs). Treating this infection can lower your risk for some STIs. You should also treat this if you are pregnant. It can cause your baby to be born early. Follow these instructions at home: Medicines  Take over-the-counter and prescription medicines only as told by your doctor.  Take or use your antibiotic medicine as told by your doctor. Do not stop taking or using it even if you start to feel better. General instructions  If you your sexual partner is a woman, tell her that you have this infection. She needs to get treatment if she has symptoms. If you have a female partner, he does not need to be treated.  During treatment: ? Avoid sex. ? Do not douche. ? Avoid alcohol as told. ? Avoid breastfeeding as told.  Drink enough fluid to keep your pee (urine) clear or pale yellow.  Keep your vagina and butt (rectum) clean. ? Wash the area with warm water every day. ? Wipe from front to back after you use the toilet.  Keep all follow-up visits as told by your doctor. This is important. Preventing this condition  Do not douche.  Use only warm water to wash around your vagina.  Use protection when you have sex. This includes: ? Latex condoms. ? Dental dams.  Limit how many people you have sex with. It is best to only have sex with the same person (be monogamous).  Get tested for STIs. Have your  partner get tested.  Wear underwear that is cotton or lined with cotton.  Avoid tight pants and pantyhose. This is most  important in summer.  Do not use any products that have nicotine or tobacco in them. These include cigarettes and e-cigarettes. If you need help quitting, ask your doctor.  Do not use illegal drugs.  Limit how much alcohol you drink. Contact a doctor if:  Your symptoms do not get better, even after you are treated.  You have more discharge or pain when you pee (urinate).  You have a fever.  You have pain in your belly (abdomen).  You have pain with sex.  Your bleed from your vagina between periods. Summary  This infection happens when too many germs (bacteria) grow in the vagina.  Treating this condition can lower your risk for some infections from sex (STIs).  You should also treat this if you are pregnant. It can cause early (premature) birth.  Do not stop taking or using your antibiotic medicine even if you start to feel better. This information is not intended to replace advice given to you by your health care provider. Make sure you discuss any questions you have with your health care provider. Document Released: 12/11/2007 Document Revised: 11/17/2015 Document Reviewed: 11/17/2015 Elsevier Interactive Patient Education  2017 Elsevier Inc.  Vaginal Yeast infection, Adult Vaginal yeast infection is a condition that causes soreness, swelling, and redness (inflammation) of the vagina. It also causes vaginal discharge. This is a common condition. Some women get this infection frequently. What are the causes? This condition is caused by a change in the normal balance of the yeast (candida) and bacteria that live in the vagina. This change causes an overgrowth of yeast, which causes the inflammation. What increases the risk? This condition is more likely to develop in:  Women who take antibiotic medicines.  Women who have diabetes.  Women who take birth control pills.  Women who are pregnant.  Women who douche often.  Women who have a weak defense (immune)  system.  Women who have been taking steroid medicines for a long time.  Women who frequently wear tight clothing.  What are the signs or symptoms? Symptoms of this condition include:  White, thick vaginal discharge.  Swelling, itching, redness, and irritation of the vagina. The lips of the vagina (vulva) may be affected as well.  Pain or a burning feeling while urinating.  Pain during sex.  How is this diagnosed? This condition is diagnosed with a medical history and physical exam. This will include a pelvic exam. Your health care provider will examine a sample of your vaginal discharge under a microscope. Your health care provider may send this sample for testing to confirm the diagnosis. How is this treated? This condition is treated with medicine. Medicines may be over-the-counter or prescription. You may be told to use one or more of the following:  Medicine that is taken orally.  Medicine that is applied as a cream.  Medicine that is inserted directly into the vagina (suppository).  Follow these instructions at home:  Take or apply over-the-counter and prescription medicines only as told by your health care provider.  Do not have sex until your health care provider has approved. Tell your sex partner that you have a yeast infection. That person should go to his or her health care provider if he or she develops symptoms.  Do not wear tight clothes, such as pantyhose or tight pants.  Avoid using tampons until your  health care provider approves.  Eat more yogurt. This may help to keep your yeast infection from returning.  Try taking a sitz bath to help with discomfort. This is a warm water bath that is taken while you are sitting down. The water should only come up to your hips and should cover your buttocks. Do this 3-4 times per day or as told by your health care provider.  Do not douche.  Wear breathable, cotton underwear.  If you have diabetes, keep your blood  sugar levels under control. Contact a health care provider if:  You have a fever.  Your symptoms go away and then return.  Your symptoms do not get better with treatment.  Your symptoms get worse.  You have new symptoms.  You develop blisters in or around your vagina.  You have blood coming from your vagina and it is not your menstrual period.  You develop pain in your abdomen. This information is not intended to replace advice given to you by your health care provider. Make sure you discuss any questions you have with your health care provider. Document Released: 12/11/2004 Document Revised: 08/15/2015 Document Reviewed: 09/04/2014 Elsevier Interactive Patient Education  2018 Elsevier Inc.      Agustina Caroli, MD [facility]

## 2018-02-09 LAB — COMPREHENSIVE METABOLIC PANEL

## 2018-02-09 LAB — URINE CULTURE: Organism ID, Bacteria: NO GROWTH

## 2018-02-09 LAB — CBC WITH DIFFERENTIAL/PLATELET
BASOS: 0 %
Basophils Absolute: 0.1 10*3/uL (ref 0.0–0.2)
EOS (ABSOLUTE): 0.2 10*3/uL (ref 0.0–0.4)
EOS: 2 %
HEMATOCRIT: 45.6 % (ref 34.0–46.6)
Hemoglobin: 15 g/dL (ref 11.1–15.9)
IMMATURE GRANS (ABS): 0 10*3/uL (ref 0.0–0.1)
IMMATURE GRANULOCYTES: 0 %
LYMPHS: 30 %
Lymphocytes Absolute: 3.5 10*3/uL — ABNORMAL HIGH (ref 0.7–3.1)
MCH: 28.9 pg (ref 26.6–33.0)
MCHC: 32.9 g/dL (ref 31.5–35.7)
MCV: 88 fL (ref 79–97)
MONOCYTES: 7 %
MONOS ABS: 0.8 10*3/uL (ref 0.1–0.9)
Neutrophils Absolute: 7.1 10*3/uL — ABNORMAL HIGH (ref 1.4–7.0)
Neutrophils: 61 %
Platelets: 369 10*3/uL (ref 150–450)
RBC: 5.19 x10E6/uL (ref 3.77–5.28)
RDW: 13.1 % (ref 12.3–15.4)
WBC: 11.7 10*3/uL — AB (ref 3.4–10.8)

## 2018-02-09 LAB — LIPID PANEL

## 2018-02-10 LAB — PAP IG AND HPV HIGH-RISK: HPV, HIGH-RISK: NEGATIVE

## 2018-02-12 ENCOUNTER — Encounter: Payer: Self-pay | Admitting: *Deleted

## 2018-02-17 ENCOUNTER — Ambulatory Visit (INDEPENDENT_AMBULATORY_CARE_PROVIDER_SITE_OTHER): Payer: BLUE CROSS/BLUE SHIELD | Admitting: Emergency Medicine

## 2018-02-17 DIAGNOSIS — E789 Disorder of lipoprotein metabolism, unspecified: Secondary | ICD-10-CM

## 2018-02-17 DIAGNOSIS — E559 Vitamin D deficiency, unspecified: Secondary | ICD-10-CM

## 2018-02-17 DIAGNOSIS — Z13220 Encounter for screening for lipoid disorders: Secondary | ICD-10-CM

## 2018-02-17 DIAGNOSIS — Z13228 Encounter for screening for other metabolic disorders: Secondary | ICD-10-CM | POA: Diagnosis not present

## 2018-02-17 DIAGNOSIS — Z1329 Encounter for screening for other suspected endocrine disorder: Secondary | ICD-10-CM

## 2018-02-17 DIAGNOSIS — Z130 Encounter for screening for diseases of the blood and blood-forming organs and certain disorders involving the immune mechanism: Secondary | ICD-10-CM

## 2018-02-17 NOTE — Progress Notes (Signed)
Visit for blood test only.

## 2018-02-18 LAB — COMPREHENSIVE METABOLIC PANEL
A/G RATIO: 1.4 (ref 1.2–2.2)
ALK PHOS: 86 IU/L (ref 39–117)
ALT: 15 IU/L (ref 0–32)
AST: 13 IU/L (ref 0–40)
Albumin: 4.4 g/dL (ref 3.5–5.5)
BILIRUBIN TOTAL: 0.5 mg/dL (ref 0.0–1.2)
BUN/Creatinine Ratio: 16 (ref 9–23)
BUN: 14 mg/dL (ref 6–24)
CHLORIDE: 102 mmol/L (ref 96–106)
CO2: 21 mmol/L (ref 20–29)
Calcium: 9.6 mg/dL (ref 8.7–10.2)
Creatinine, Ser: 0.88 mg/dL (ref 0.57–1.00)
GFR calc non Af Amer: 79 mL/min/{1.73_m2} (ref >59)
GFR, EST AFRICAN AMERICAN: 91 mL/min/{1.73_m2} (ref >59)
Globulin, Total: 3.1 g/dL (ref 1.5–4.5)
Glucose: 104 mg/dL — ABNORMAL HIGH (ref 65–99)
POTASSIUM: 3.9 mmol/L (ref 3.5–5.2)
Sodium: 139 mmol/L (ref 134–144)
TOTAL PROTEIN: 7.5 g/dL (ref 6.0–8.5)

## 2018-02-18 LAB — LIPID PANEL
CHOL/HDL RATIO: 2.4 ratio (ref 0.0–4.4)
Cholesterol, Total: 127 mg/dL (ref 100–199)
HDL: 52 mg/dL (ref >39)
LDL CALC: 50 mg/dL (ref 0–99)
TRIGLYCERIDES: 124 mg/dL (ref 0–149)
VLDL CHOLESTEROL CAL: 25 mg/dL (ref 5–40)

## 2018-02-18 LAB — VITAMIN D 25 HYDROXY (VIT D DEFICIENCY, FRACTURES): Vit D, 25-Hydroxy: 23.4 ng/mL — ABNORMAL LOW (ref 30.0–100.0)

## 2018-02-22 ENCOUNTER — Encounter: Payer: Self-pay | Admitting: *Deleted

## 2018-02-28 ENCOUNTER — Encounter (HOSPITAL_COMMUNITY): Payer: Self-pay | Admitting: Emergency Medicine

## 2018-02-28 ENCOUNTER — Other Ambulatory Visit: Payer: Self-pay

## 2018-02-28 ENCOUNTER — Emergency Department (HOSPITAL_COMMUNITY)
Admission: EM | Admit: 2018-02-28 | Discharge: 2018-02-28 | Disposition: A | Discharge status: Home / Self Care | Payer: BLUE CROSS/BLUE SHIELD | Attending: Emergency Medicine | Admitting: Emergency Medicine

## 2018-02-28 ENCOUNTER — Emergency Department (HOSPITAL_COMMUNITY): Payer: BLUE CROSS/BLUE SHIELD

## 2018-02-28 DIAGNOSIS — Z79899 Other long term (current) drug therapy: Secondary | ICD-10-CM | POA: Insufficient documentation

## 2018-02-28 DIAGNOSIS — R0789 Other chest pain: Secondary | ICD-10-CM | POA: Diagnosis not present

## 2018-02-28 DIAGNOSIS — R000 Tachycardia, unspecified: Secondary | ICD-10-CM | POA: Diagnosis not present

## 2018-02-28 DIAGNOSIS — R079 Chest pain, unspecified: Secondary | ICD-10-CM | POA: Diagnosis not present

## 2018-02-28 DIAGNOSIS — I10 Essential (primary) hypertension: Secondary | ICD-10-CM | POA: Insufficient documentation

## 2018-02-28 DIAGNOSIS — R0602 Shortness of breath: Secondary | ICD-10-CM | POA: Diagnosis not present

## 2018-02-28 LAB — BASIC METABOLIC PANEL
Anion gap: 11 (ref 5–15)
BUN: 15 mg/dL (ref 6–20)
CO2: 26 mmol/L (ref 22–32)
Calcium: 9.2 mg/dL (ref 8.9–10.3)
Chloride: 103 mmol/L (ref 98–111)
Creatinine, Ser: 0.8 mg/dL (ref 0.44–1.00)
GFR calc Af Amer: >60 mL/min (ref >60)
GFR calc non Af Amer: >60 mL/min (ref >60)
Glucose, Bld: 96 mg/dL (ref 70–99)
Potassium: 3.4 mmol/L — ABNORMAL LOW (ref 3.5–5.1)
Sodium: 140 mmol/L (ref 135–145)

## 2018-02-28 LAB — CBC
HCT: 40.1 % (ref 36.0–46.0)
Hemoglobin: 13.3 g/dL (ref 12.0–15.0)
MCH: 29.2 pg (ref 26.0–34.0)
MCHC: 33.2 g/dL (ref 30.0–36.0)
MCV: 88.1 fL (ref 80.0–100.0)
Platelets: 374 10*3/uL (ref 150–400)
RBC: 4.55 MIL/uL (ref 3.87–5.11)
RDW: 12.9 % (ref 11.5–15.5)
WBC: 10.8 10*3/uL — ABNORMAL HIGH (ref 4.0–10.5)
nRBC: 0 % (ref 0.0–0.2)

## 2018-02-28 LAB — I-STAT TROPONIN, ED
Troponin i, poc: 0.01 ng/mL (ref 0.00–0.08)
Troponin i, poc: 0 ng/mL (ref 0.00–0.08)

## 2018-02-28 LAB — I-STAT BETA HCG BLOOD, ED (MC, WL, AP ONLY): I-stat hCG, quantitative: <5 m[IU]/mL (ref <5)

## 2018-02-28 NOTE — ED Triage Notes (Signed)
Pt reports feeling chest pain for the last 3 days. With aching pain without radiation. Pt reporting elevated blood pressure and headaches.

## 2018-02-28 NOTE — ED Notes (Signed)
Patient is getting undressed

## 2018-02-28 NOTE — ED Provider Notes (Signed)
[facility]-EMERGENCY DEPT Provider Note   CSN: [id number] Arrival date & time: 02/28/18  0357     History   Chief Complaint Chief Complaint  Patient presents with  . Chest Pain    HPI Cathy Cole is a 47 y.o. female.  The history is provided by the patient. No language interpreter was used.  Chest Pain      47 year old female with history of hypertension, menorrhagia, anxiety presenting today for evaluation of chest pain.  Patient report for the past 5 days she has noticed an intermittent chest pain.  She described pain as a sharp throbbing sensation to her left chest that is waxing waning usually lasting for a few minutes.  While she is experiencing the chest pain she also noticed pain to her temporal region bilaterally.  She endorsed occasional bouts of dizziness.  Her symptoms brought on by stress as she has experiencing increasing stress within the past few days.  At this time pain is minimal.  She denies any associated exertional chest pain, shortness of breath, nausea, diaphoresis.  No URI symptoms.  No back pain, abdominal pain.  She does not have any significant family cardiac history, no history of tobacco abuse.  She also noticed that each time she experiencing chest pain, her blood pressure elevated.  States that it has been running in the 130s to 140 systolic which is high for her.  Last night her systolic was 150s prompting her to come to ER.  Past Medical History:  Diagnosis Date  . Abnormal uterine bleeding (AUB) 11/07/2014  . Allergy   . Anxiety   . Hypertension   . Menorrhagia 11/07/2014    Patient Active Problem List   Diagnosis Date Noted  . Vaginal discharge 02/08/2018  . Bacterial vaginitis 06/26/2017  . Abnormal uterine bleeding (AUB) 11/07/2014  . Menorrhagia 11/07/2014    Past Surgical History:  Procedure Laterality Date  . BUNIONECTOMY    . COSMETIC SURGERY    . NOVASURE ABLATION N/A 11/08/2014   Procedure: NOVASURE  ABLATION;  Surgeon: Sherian Rein, MD;  Location: [facility];  Service: Gynecology;  Laterality: N/A;  . TUBAL LIGATION       OB History   No obstetric history on file.      Home Medications    Prior to Admission medications   Medication Sig Start Date End Date Taking? Authorizing Provider  acetaminophen (TYLENOL) 500 MG tablet Take 1,000 mg by mouth every 6 (six) hours as needed for mild pain, moderate pain or headache.   Yes [provider]  Cholecalciferol (VITAMIN D3) 2000 UNITS TABS Take 2,000 Units by mouth daily.    Yes [provider]  hydrochlorothiazide (MICROZIDE) 12.5 MG capsule Take 12.5 mg by mouth daily.  03/26/16  Yes [provider]  LORazepam (ATIVAN) 0.5 MG tablet TAKE 1 TABLET BY MOUTH BY MOUTH AS NEEDED FOR ANXIETY Patient taking differently: Take 0.5 mg by mouth daily as needed for anxiety.  06/26/17  Yes McVey, Madelaine Bhat, PA-C  metoprolol succinate (TOPROL-XL) 25 MG 24 hr tablet Take 1 tablet (25 mg total) by mouth daily. 06/26/17  Yes McVey, Madelaine Bhat, PA-C    Family History Family History  Problem Relation Age of Onset  . Diabetes Other   . Hypertension Other   . Hypertension Mother   . Diabetes Maternal Grandmother   . Hypertension Maternal Grandmother     Social History Social History   Tobacco Use  . Smoking status: Never Smoker  .  Smokeless tobacco: Never Used  Substance Use Topics  . Alcohol use: No  . Drug use: Never     Allergies   Bactrim [sulfamethoxazole-trimethoprim]   Review of Systems Review of Systems  Cardiovascular: Positive for chest pain.  All other systems reviewed and are negative.    Physical Exam Updated Vital Signs BP (!) 173/101 (BP Location: Right Arm)   Pulse 91   Temp 98.3 F (36.8 C) (Oral)   Resp 13   Ht 5' 3.5" (1.613 m)   Wt 80.4 kg   LMP 02/22/2018   SpO2 100%   BMI 30.91 kg/m   Physical Exam Vitals signs and nursing note reviewed.  Constitutional:       General: She is not in acute distress.    Appearance: She is well-developed.  HENT:     Head: Atraumatic.  Eyes:     Conjunctiva/sclera: Conjunctivae normal.  Neck:     Musculoskeletal: Neck supple.  Cardiovascular:     Rate and Rhythm: Normal rate and regular rhythm.     Pulses: Normal pulses.     Heart sounds: Normal heart sounds.  Pulmonary:     Effort: Pulmonary effort is normal.     Breath sounds: Normal breath sounds.  Abdominal:     Palpations: Abdomen is soft.     Tenderness: There is no abdominal tenderness.  Musculoskeletal:        General: No swelling.  Skin:    Findings: No rash.  Neurological:     Mental Status: She is alert and oriented to person, place, and time.  Psychiatric:        Mood and Affect: Mood normal.      ED Treatments / Results  Labs (all labs ordered are listed, but only abnormal results are displayed) Labs Reviewed  BASIC METABOLIC PANEL - Abnormal; Notable for the following components:      Result Value   Potassium 3.4 (*)    All other components within normal limits  CBC - Abnormal; Notable for the following components:   WBC 10.8 (*)    All other components within normal limits  I-STAT TROPONIN, ED  I-STAT BETA HCG BLOOD, ED ([facility], [facility], [facility] ONLY)  I-STAT TROPONIN, ED    EKG EKG Interpretation  Date/Time:  Sunday February 28 2018 04:02:44 EST Ventricular Rate:  103 PR Interval:    QRS Duration: 76 QT Interval:  345 QTC Calculation: 452 R Axis:   22 Text Interpretation:  Sinus tachycardia Probable left atrial enlargement Low voltage, precordial leads Probable anteroseptal infarct, old Baseline wander in lead(s) III aVF No significant change since last tracing 30 Jun 2017 Confirmed by Devoria AlbeKnapp, Iva ([id number]) on 02/28/2018 4:34:18 AM   Radiology Dg Chest 2 View  Result Date: 02/28/2018 CLINICAL DATA:  Chest pain and shortness of breath. EXAM: CHEST - 2 VIEW COMPARISON:  07/01/2014 FINDINGS: The cardiomediastinal contours are  normal. The lungs are clear. Pulmonary vasculature is normal. No consolidation, pleural effusion, or pneumothorax. No acute osseous abnormalities are seen. IMPRESSION: Negative radiographs of the chest. Electronically Signed   By: Narda RutherfordMelanie  Sanford M.D.   On: 02/28/2018 04:37    Procedures Procedures (including critical care time)  Medications Ordered in ED Medications - No data to display   Initial Impression / Assessment and Plan / ED Course  I have reviewed the triage vital signs and the nursing notes.  Pertinent labs & imaging results that were available during my care of the patient were reviewed by me and  considered in my medical decision making (see chart for details).     BP (!) 126/91   Pulse 74   Temp 98.3 F (36.8 C) (Oral)   Resp 18   Ht 5' 3.5" (1.613 m)   Wt 80.4 kg   LMP 02/22/2018   SpO2 97%   BMI 30.91 kg/m    Final Clinical Impressions(s) / ED Diagnoses   Final diagnoses:  Atypical chest pain    ED Discharge Orders    None     6:16 AM Patient here with atypical chest pain.  Pain brought on by stress.  She is also voiced concerning for her elevated blood pressure. Work up initiated.  Pt without significant discomfort.  She's resting comfortably.  7:56 AM EKG and labs are reassuring.  Normal delta troponin.  Blood pressure normalized without any specific treatment.  She is stable for discharge she will follow-up closely with PCP for outpatient management.  Her heart score is 2.    Fayrene Helper, PA-C 02/28/18 1610    Devoria Albe, MD 02/28/18 [phone]

## 2018-05-10 ENCOUNTER — Ambulatory Visit: Payer: BLUE CROSS/BLUE SHIELD | Admitting: Emergency Medicine

## 2018-05-10 ENCOUNTER — Other Ambulatory Visit: Payer: Self-pay

## 2018-05-10 ENCOUNTER — Encounter: Payer: Self-pay | Admitting: Emergency Medicine

## 2018-05-10 VITALS — BP 121/81 | HR 84 | Temp 98.6°F | Resp 16 | Ht 64.0 in | Wt 179.6 lb

## 2018-05-10 DIAGNOSIS — N760 Acute vaginitis: Secondary | ICD-10-CM | POA: Diagnosis not present

## 2018-05-10 DIAGNOSIS — B9689 Other specified bacterial agents as the cause of diseases classified elsewhere: Secondary | ICD-10-CM

## 2018-05-10 DIAGNOSIS — F411 Generalized anxiety disorder: Secondary | ICD-10-CM | POA: Insufficient documentation

## 2018-05-10 DIAGNOSIS — N898 Other specified noninflammatory disorders of vagina: Secondary | ICD-10-CM | POA: Diagnosis not present

## 2018-05-10 LAB — POCT URINALYSIS DIP (MANUAL ENTRY)
BILIRUBIN UA: NEGATIVE
Glucose, UA: NEGATIVE mg/dL
Ketones, POC UA: NEGATIVE mg/dL
Leukocytes, UA: NEGATIVE
Nitrite, UA: NEGATIVE
Protein Ur, POC: NEGATIVE mg/dL
Spec Grav, UA: 1.02 (ref 1.010–1.025)
Urobilinogen, UA: 0.2 E.U./dL
pH, UA: 6.5 (ref 5.0–8.0)

## 2018-05-10 LAB — POC MICROSCOPIC URINALYSIS (UMFC): Mucus: ABSENT

## 2018-05-10 LAB — POCT WET + KOH PREP
Trich by wet prep: ABSENT
Yeast by KOH: ABSENT
Yeast by wet prep: ABSENT

## 2018-05-10 LAB — POCT URINE PREGNANCY: Preg Test, Ur: NEGATIVE

## 2018-05-10 MED ORDER — CLINDAMYCIN HCL 300 MG PO CAPS: 300.0000 mg | ORAL_CAPSULE | Freq: Two times a day (BID) | ORAL | 0 refills | Status: AC

## 2018-05-10 MED ORDER — LORAZEPAM 0.5 MG PO TABS: 0.5000 mg | ORAL_TABLET | Freq: Every day | ORAL | 0 refills | Status: DC | PRN

## 2018-05-10 NOTE — Progress Notes (Signed)
Cathy Cole 48 y.o.   Chief Complaint  Patient presents with  . Vaginal Discharge    with slight odor and itching x 1 week  . Medication Refill    ativan prescribed by McVey    HISTORY OF PRESENT ILLNESS: This is a 48 y.o. female complaining of vaginal discharge with odor and itching for 1 week.  Sexually active.  Last time she had intercourse was 3 weeks ago.  Uneventful.  No pelvic pain or abnormal vaginal bleeding.  Has a history of recurrent bacterial vaginosis.  No other significant symptoms. Also has a history of chronic anxiety since 2011, takes Ativan periodically, not every day.  Requesting medication refill.  HPI   Prior to Admission medications   Medication Sig Start Date End Date Taking? Authorizing Provider  acetaminophen (TYLENOL) 500 MG tablet Take 1,000 mg by mouth every 6 (six) hours as needed for mild pain, moderate pain or headache.   Yes [provider]  Cholecalciferol (VITAMIN D3) 2000 UNITS TABS Take 2,000 Units by mouth daily.    Yes [provider]  hydrochlorothiazide (MICROZIDE) 12.5 MG capsule Take 12.5 mg by mouth daily.  03/26/16  Yes [provider]  LORazepam (ATIVAN) 0.5 MG tablet TAKE 1 TABLET BY MOUTH BY MOUTH AS NEEDED FOR ANXIETY Patient taking differently: Take 0.5 mg by mouth daily as needed for anxiety.  06/26/17  Yes McVey, Madelaine Bhat, PA-C  metoprolol succinate (TOPROL-XL) 25 MG 24 hr tablet Take 1 tablet (25 mg total) by mouth daily. 06/26/17  Yes McVey, Madelaine Bhat, PA-C    Allergies  Allergen Reactions  . Bactrim [Sulfamethoxazole-Trimethoprim] Anaphylaxis    Chest pain    Patient Active Problem List   Diagnosis Date Noted  . Vaginal discharge 02/08/2018  . Bacterial vaginitis 06/26/2017  . Abnormal uterine bleeding (AUB) 11/07/2014  . Menorrhagia 11/07/2014    Past Medical History:  Diagnosis Date  . Abnormal uterine bleeding (AUB) 11/07/2014  . Allergy   . Anxiety   . Hypertension     . Menorrhagia 11/07/2014    Past Surgical History:  Procedure Laterality Date  . BUNIONECTOMY    . COSMETIC SURGERY    . NOVASURE ABLATION N/A 11/08/2014   Procedure: NOVASURE ABLATION;  Surgeon: Sherian Rein, MD;  Location: [facility];  Service: Gynecology;  Laterality: N/A;  . TUBAL LIGATION      Social History   Socioeconomic History  . Marital status: Divorced    Spouse name: Not on file  . Number of children: 3  . Years of education: Not on file  . Highest education level: Not on file  Occupational History  . Not on file  Social Needs  . Financial resource strain: Not on file  . Food insecurity:    Worry: Not on file    Inability: Not on file  . Transportation needs:    Medical: Not on file    Non-medical: Not on file  Tobacco Use  . Smoking status: Never Smoker  . Smokeless tobacco: Never Used  Substance and Sexual Activity  . Alcohol use: No  . Drug use: Never  . Sexual activity: Yes  Lifestyle  . Physical activity:    Days per week: Not on file    Minutes per session: Not on file  . Stress: Not on file  Relationships  . Social connections:    Talks on phone: Not on file    Gets together: Not on file    Attends religious service: Not  on file    Active member of club or organization: Not on file    Attends meetings of clubs or organizations: Not on file    Relationship status: Not on file  . Intimate partner violence:    Fear of current or ex partner: Not on file    Emotionally abused: Not on file    Physically abused: Not on file    Forced sexual activity: Not on file  Other Topics Concern  . Not on file  Social History Narrative  . Not on file    Family History  Problem Relation Age of Onset  . Diabetes Other   . Hypertension Other   . Hypertension Mother   . Diabetes Maternal Grandmother   . Hypertension Maternal Grandmother      Review of Systems  Constitutional: Negative.  Negative for fever.  HENT: Negative.  Negative for sore  throat.   Eyes: Negative.  Negative for discharge and redness.  Respiratory: Negative.  Negative for cough and shortness of breath.   Cardiovascular: Negative.  Negative for chest pain and palpitations.  Gastrointestinal: Negative.  Negative for abdominal pain, diarrhea, nausea and vomiting.  Genitourinary: Negative.  Negative for dysuria, flank pain, frequency and hematuria.  Musculoskeletal: Negative.   Skin: Negative.  Negative for rash.  Neurological: Negative.  Negative for dizziness and headaches.  Endo/Heme/Allergies: Negative.   All other systems reviewed and are negative.   Vitals:   05/10/18 1432  BP: 121/81  Pulse: 84  Resp: 16  Temp: 98.6 F (37 C)  SpO2: 99%    Physical Exam Vitals signs reviewed. Exam conducted with a chaperone present.  Constitutional:      Appearance: Normal appearance.  HENT:     Head: Normocephalic and atraumatic.     Mouth/Throat:     Mouth: Mucous membranes are moist.     Pharynx: Oropharynx is clear.  Neck:     Musculoskeletal: Normal range of motion and neck supple.  Cardiovascular:     Rate and Rhythm: Normal rate and regular rhythm.     Heart sounds: Normal heart sounds.  Pulmonary:     Effort: Pulmonary effort is normal.     Breath sounds: Normal breath sounds.  Abdominal:     General: Bowel sounds are normal. There is no distension.     Palpations: Abdomen is soft. There is no mass.     Tenderness: There is no abdominal tenderness. There is no guarding.     Hernia: There is no hernia in the right inguinal area or left inguinal area.  Genitourinary:    Pubic Area: No rash.      Labia:        Right: No rash.        Left: No rash.      Vagina: No signs of injury and foreign body. Vaginal discharge present. No erythema.     Cervix: No cervical motion tenderness, discharge, lesion, erythema or cervical bleeding.  Musculoskeletal: Normal range of motion.  Lymphadenopathy:     Lower Body: No right inguinal adenopathy. No left  inguinal adenopathy.  Skin:    General: Skin is warm and dry.     Capillary Refill: Capillary refill takes less than 2 seconds.  Neurological:     General: No focal deficit present.     Mental Status: She is alert and oriented to person, place, and time.  Psychiatric:        Mood and Affect: Mood normal.  Behavior: Behavior normal.    Results for orders placed or performed in visit on 05/10/18 (from the past 24 hour(s))  POCT urinalysis dipstick     Status: Abnormal   Collection Time: 05/10/18  2:50 PM  Result Value Ref Range   Color, UA yellow yellow   Clarity, UA clear clear   Glucose, UA negative negative mg/dL   Bilirubin, UA negative negative   Ketones, POC UA negative negative mg/dL   Spec Grav, UA 3.612 2.449 - 1.025   Blood, UA small (A) negative   pH, UA 6.5 5.0 - 8.0   Protein Ur, POC negative negative mg/dL   Urobilinogen, UA 0.2 0.2 or 1.0 E.U./dL   Nitrite, UA Negative Negative   Leukocytes, UA Negative Negative  POCT Microscopic Urinalysis ([facility])     Status: Abnormal   Collection Time: 05/10/18  2:55 PM  Result Value Ref Range   WBC,UR,HPF,POC None None WBC/hpf   RBC,UR,HPF,POC None None RBC/hpf   Bacteria None None, Too numerous to count   Mucus Absent Absent   Epithelial Cells, UR Per Microscopy Moderate (A) None, Too numerous to count cells/hpf  POCT urine pregnancy     Status: None   Collection Time: 05/10/18  2:55 PM  Result Value Ref Range   Preg Test, Ur Negative Negative  POCT Wet + KOH Prep     Status: Abnormal   Collection Time: 05/10/18  3:03 PM  Result Value Ref Range   Yeast by KOH Absent Absent   Yeast by wet prep Absent Absent   WBC by wet prep Few Few   Clue Cells Wet Prep HPF POC Few (A) None   Trich by wet prep Absent Absent   Bacteria Wet Prep HPF POC Many (A) Few   Epithelial Cells By [employer] ([facility]) Many (A) None, Few, Too numerous to count   RBC,UR,HPF,POC None None RBC/hpf   A total of 25 minutes was spent in the room with  the patient, greater than 50% of which was in counseling/coordination of care regarding diagnosis, treatment, medications, and need for follow-up if no better or worse.   ASSESSMENT & PLAN: Antania was seen today for vaginal discharge and medication refill.  Diagnoses and all orders for this visit:  Bacterial vaginosis -     clindamycin (CLEOCIN) 300 MG capsule; Take 1 capsule (300 mg total) by mouth 2 (two) times daily for 7 days.  Vaginal discharge -     POCT Microscopic Urinalysis ([facility]) -     POCT urinalysis dipstick -     POCT urine pregnancy -     POCT Wet + KOH Prep -     GC/Chlamydia Probe Amp  Generalized anxiety disorder -     LORazepam (ATIVAN) 0.5 MG tablet; Take 1 tablet (0.5 mg total) by mouth daily as needed for anxiety.    Patient Instructions       If you have lab work done today you will be contacted with your lab results within the next 2 weeks.  If you have not heard from [place] then please contact us. The fastest way to get your results is to register for My Chart.   IF you received an x-ray today, you will receive an invoice from [facility]. Please contact [facility] at [phone] with questions or concerns regarding your invoice.   IF you received labwork today, you will receive an invoice from [facility]. Please contact [facility] at [phone] with questions or concerns regarding your invoice.  Our billing staff will not be able to assist you with questions regarding bills from these companies.  You will be contacted with the lab results as soon as they are available. The fastest way to get your results is to activate your My Chart account. Instructions are located on the last page of this paperwork. If you have not heard from [place] regarding the results in 2 weeks, please contact this office.     Bacterial Vaginosis  Bacterial vaginosis is an infection of the vagina. It happens when too many normal germs (healthy bacteria) grow in  the vagina. This infection puts you at risk for infections from sex (STIs). Treating this infection can lower your risk for some STIs. You should also treat this if you are pregnant. It can cause your baby to be born early. Follow these instructions at home: Medicines  Take over-the-counter and prescription medicines only as told by your doctor.  Take or use your antibiotic medicine as told by your doctor. Do not stop taking or using it even if you start to feel better. General instructions  If you your sexual partner is a woman, tell her that you have this infection. She needs to get treatment if she has symptoms. If you have a female partner, he does not need to be treated.  During treatment: ? Avoid sex. ? Do not douche. ? Avoid alcohol as told. ? Avoid breastfeeding as told.  Drink enough fluid to keep your pee (urine) clear or pale yellow.  Keep your vagina and butt (rectum) clean. ? Wash the area with warm water every day. ? Wipe from front to back after you use the toilet.  Keep all follow-up visits as told by your doctor. This is important. Preventing this condition  Do not douche.  Use only warm water to wash around your vagina.  Use protection when you have sex. This includes: ? Latex condoms. ? Dental dams.  Limit how many people you have sex with. It is best to only have sex with the same person (be monogamous).  Get tested for STIs. Have your partner get tested.  Wear underwear that is cotton or lined with cotton.  Avoid tight pants and pantyhose. This is most important in summer.  Do not use any products that have nicotine or tobacco in them. These include cigarettes and e-cigarettes. If you need help quitting, ask your doctor.  Do not use illegal drugs.  Limit how much alcohol you drink. Contact a doctor if:  Your symptoms do not get better, even after you are treated.  You have more discharge or pain when you pee (urinate).  You have a fever.  You  have pain in your belly (abdomen).  You have pain with sex.  Your bleed from your vagina between periods. Summary  This infection happens when too many germs (bacteria) grow in the vagina.  Treating this condition can lower your risk for some infections from sex (STIs).  You should also treat this if you are pregnant. It can cause early (premature) birth.  Do not stop taking or using your antibiotic medicine even if you start to feel better. This information is not intended to replace advice given to you by your health care provider. Make sure you discuss any questions you have with your health care provider. Document Released: 12/11/2007 Document Revised: 11/17/2015 Document Reviewed: 11/17/2015 Elsevier Interactive Patient Education  2019 Elsevier Inc.      Edwina Barth, MD [facility]

## 2018-05-10 NOTE — Patient Instructions (Addendum)
If you have lab work done today you will be contacted with your lab results within the next 2 weeks.  If you have not heard from [place] then please contact us. The fastest way to get your results is to register for My Chart.   IF you received an x-ray today, you will receive an invoice from [facility]. Please contact [facility] at [phone] with questions or concerns regarding your invoice.   IF you received labwork today, you will receive an invoice from [facility]. Please contact [facility] at [phone] with questions or concerns regarding your invoice.   Our billing staff will not be able to assist you with questions regarding bills from these companies.  You will be contacted with the lab results as soon as they are available. The fastest way to get your results is to activate your My Chart account. Instructions are located on the last page of this paperwork. If you have not heard from [place] regarding the results in 2 weeks, please contact this office.     Bacterial Vaginosis  Bacterial vaginosis is an infection of the vagina. It happens when too many normal germs (healthy bacteria) grow in the vagina. This infection puts you at risk for infections from sex (STIs). Treating this infection can lower your risk for some STIs. You should also treat this if you are pregnant. It can cause your baby to be born early. Follow these instructions at home: Medicines  Take over-the-counter and prescription medicines only as told by your doctor.  Take or use your antibiotic medicine as told by your doctor. Do not stop taking or using it even if you start to feel better. General instructions  If you your sexual partner is a woman, tell her that you have this infection. She needs to get treatment if she has symptoms. If you have a female partner, he does not need to be treated.  During treatment: ? Avoid sex. ? Do not douche. ? Avoid alcohol as told. ? Avoid breastfeeding as  told.  Drink enough fluid to keep your pee (urine) clear or pale yellow.  Keep your vagina and butt (rectum) clean. ? Wash the area with warm water every day. ? Wipe from front to back after you use the toilet.  Keep all follow-up visits as told by your doctor. This is important. Preventing this condition  Do not douche.  Use only warm water to wash around your vagina.  Use protection when you have sex. This includes: ? Latex condoms. ? Dental dams.  Limit how many people you have sex with. It is best to only have sex with the same person (be monogamous).  Get tested for STIs. Have your partner get tested.  Wear underwear that is cotton or lined with cotton.  Avoid tight pants and pantyhose. This is most important in summer.  Do not use any products that have nicotine or tobacco in them. These include cigarettes and e-cigarettes. If you need help quitting, ask your doctor.  Do not use illegal drugs.  Limit how much alcohol you drink. Contact a doctor if:  Your symptoms do not get better, even after you are treated.  You have more discharge or pain when you pee (urinate).  You have a fever.  You have pain in your belly (abdomen).  You have pain with sex.  Your bleed from your vagina between periods. Summary  This infection happens when too many germs (bacteria) grow in the vagina.  Treating this  condition can lower your risk for some infections from sex (STIs).  You should also treat this if you are pregnant. It can cause early (premature) birth.  Do not stop taking or using your antibiotic medicine even if you start to feel better. This information is not intended to replace advice given to you by your health care provider. Make sure you discuss any questions you have with your health care provider. Document Released: 12/11/2007 Document Revised: 11/17/2015 Document Reviewed: 11/17/2015 Elsevier Interactive Patient Education  2019 [employer].

## 2018-05-12 ENCOUNTER — Encounter: Payer: Self-pay | Admitting: Emergency Medicine

## 2018-05-12 LAB — GC/CHLAMYDIA PROBE AMP
Chlamydia trachomatis, NAA: NEGATIVE
NEISSERIA GONORRHOEAE BY PCR: NEGATIVE

## 2018-06-03 ENCOUNTER — Other Ambulatory Visit: Payer: Self-pay | Admitting: Physician Assistant

## 2018-06-03 NOTE — Telephone Encounter (Signed)
Requested medication (s) are due for refill today: yes  Requested medication (s) are on the active medication list:yes Last refill:  06/26/17  Future visit scheduled:yes Notes to clinic: historical medication and provider  Requested Prescriptions  Pending Prescriptions Disp Refills   hydrochlorothiazide (MICROZIDE) 12.5 MG capsule [Pharmacy Med Name: HYDROCHLOROTHIAZIDE 12.5 MG CP] 90 capsule 0    Sig: TAKE 1 CAPSULE BY MOUTH EVERY DAY     Cardiovascular: Diuretics - Thiazide Failed - 06/03/2018  2:25 PM      Failed - K in normal range and within 360 days    Potassium  Date Value Ref Range Status  02/28/2018 3.4 (L) 3.5 - 5.1 mmol/L Final         Passed - Ca in normal range and within 360 days    Calcium  Date Value Ref Range Status  02/28/2018 9.2 8.9 - 10.3 mg/dL Final         Passed - Cr in normal range and within 360 days    Creatinine, Ser  Date Value Ref Range Status  02/28/2018 0.80 0.44 - 1.00 mg/dL Final         Passed - Na in normal range and within 360 days    Sodium  Date Value Ref Range Status  02/28/2018 140 135 - 145 mmol/L Final  02/17/2018 139 134 - 144 mmol/L Final         Passed - Last BP in normal range    BP Readings from Last 1 Encounters:  05/10/18 121/81         Passed - Valid encounter within last 6 months    Recent Outpatient Visits          3 weeks ago Bacterial vaginosis   Primary Care at [facility], Eilleen Kempf, MD   3 months ago Screening for endocrine disorder   Primary Care at [place], Eilleen Kempf, MD   3 months ago Encounter for general adult medical examination with abnormal findings   Primary Care at [place], Eilleen Kempf, MD   8 months ago Preoperative clearance   Primary Care at [facility], Cane Savannah, PA-C   8 months ago Acute upper respiratory infection   Primary Care at [facility], Gerald Stabs, PA-C      Future Appointments            In 2 months Sagardia, Eilleen Kempf, MD Primary Care  at [place], [facility]

## 2018-06-03 NOTE — Telephone Encounter (Signed)
Patient called regarding her refill.  She stated that she was told that the office needed a fax #, it is [phone].  Also the patient stated that she wanted her prescription to be sent to [facility] on [address] in [place].

## 2018-06-04 NOTE — Telephone Encounter (Signed)
Pt called back in and stated that Cathy Cole was the last one to call in this med for her.  Pt stated she has maybe 10 days left. She normally gets a 90 day supply   Ph[facility] [address]

## 2018-06-04 NOTE — Telephone Encounter (Signed)
Called pt and left message to call back and give the name of the ordering provider for the HCTZ. Med was refused prescriber not at this practice."

## 2018-06-05 MED ORDER — HYDROCHLOROTHIAZIDE 12.5 MG PO CAPS: 12.5000 mg | ORAL_CAPSULE | Freq: Every day | ORAL | 0 refills | Status: DC

## 2018-06-05 NOTE — Telephone Encounter (Addendum)
Last filled 06/26/2017 for a year supply. I will sent her a 90 day supply since she is unable to come in and due to the Pandemic currently going on

## 2018-06-05 NOTE — Addendum Note (Signed)
Addended by: Lisbeth Renshaw, Fotios Amos HUA on: 06/05/2018 09:38 AM   Modules accepted: Orders

## 2018-07-05 ENCOUNTER — Telehealth: Payer: Self-pay | Admitting: Emergency Medicine

## 2018-07-05 NOTE — Telephone Encounter (Signed)
Spoke with pt advised she will need to come in for nurse visit to do self wet prep as provider will not send in antibiotic with evidence of infection and I would scheduled her for telemed with Sagardia at 11:00 am and wet prep results will be back and provider can dx and treat as needed.  Pt agreeable. Dgaddy, CMA

## 2018-07-05 NOTE — Telephone Encounter (Signed)
Copied from CRM [id number]. Topic: Quick Communication - Rx Refill/Question >> Jul 05, 2018  2:22 PM [place], [place] D wrote: Medication: clindamycin (CLEOCIN) 300 MG capsule / Pt stated she is having discharge again and has developed bacterial vaginosis again. She would like to know if clindamycin can be sent to her pharmacy along with medication for a yeast infection as the clindamycin causes pt to develop a yeast infection. Please advise  Has the patient contacted their pharmacy? Yes.   (Agent: If no, request that the patient contact the pharmacy for the refill.) (Agent: If yes, when and what did the pharmacy advise?)  Preferred Pharmacy (with phone number or street name): [id number] [place], [place] - [address] [phone] (Phone) [phone] (Fax)    Agent: Please be advised that RX refills may take up to 3 business days. We ask that you follow-up with your pharmacy.

## 2018-07-07 ENCOUNTER — Telehealth: Payer: Self-pay | Admitting: Physician Assistant

## 2018-07-07 ENCOUNTER — Encounter: Payer: Self-pay | Admitting: Family Medicine

## 2018-07-07 ENCOUNTER — Ambulatory Visit: Payer: BLUE CROSS/BLUE SHIELD | Admitting: Family Medicine

## 2018-07-07 ENCOUNTER — Other Ambulatory Visit: Payer: Self-pay

## 2018-07-07 ENCOUNTER — Other Ambulatory Visit (HOSPITAL_COMMUNITY)
Admission: RE | Admit: 2018-07-07 | Discharge: 2018-07-07 | Disposition: A | Discharge status: Home / Self Care | Payer: BLUE CROSS/BLUE SHIELD | Source: Ambulatory Visit | Attending: Family Medicine | Admitting: Family Medicine

## 2018-07-07 ENCOUNTER — Ambulatory Visit (INDEPENDENT_AMBULATORY_CARE_PROVIDER_SITE_OTHER): Payer: BLUE CROSS/BLUE SHIELD | Admitting: Family Medicine

## 2018-07-07 VITALS — BP 130/82 | HR 94 | Temp 98.0°F | Resp 16 | Ht 64.96 in | Wt 184.0 lb

## 2018-07-07 DIAGNOSIS — N898 Other specified noninflammatory disorders of vagina: Secondary | ICD-10-CM | POA: Insufficient documentation

## 2018-07-07 DIAGNOSIS — B9689 Other specified bacterial agents as the cause of diseases classified elsewhere: Secondary | ICD-10-CM

## 2018-07-07 DIAGNOSIS — N760 Acute vaginitis: Secondary | ICD-10-CM

## 2018-07-07 LAB — POCT WET + KOH PREP
Trich by wet prep: ABSENT
Trich by wet prep: ABSENT
Yeast by KOH: ABSENT
Yeast by KOH: ABSENT
Yeast by wet prep: ABSENT
Yeast by wet prep: ABSENT

## 2018-07-07 MED ORDER — FLUCONAZOLE 150 MG PO TABS: 150.0000 mg | ORAL_TABLET | Freq: Once | ORAL | 0 refills | Status: AC

## 2018-07-07 MED ORDER — METRONIDAZOLE 500 MG PO TABS: 500.0000 mg | ORAL_TABLET | Freq: Two times a day (BID) | ORAL | 0 refills | Status: DC

## 2018-07-07 NOTE — Telephone Encounter (Signed)
Copied from CRM [id number]. Topic: Quick Communication - Rx Refill/Question >> Jul 07, 2018 11:36 AM Richarda Blade wrote: Medication: Diflucan for yeast infection when the patient finishes the antibiotics   Has the patient contacted their pharmacy? No. (Agent: If no, request that the patient contact the pharmacy for the refill.) The patient had a virtual visit this morning and the Dr. Hassan Buckler they would call it in but did not.   Preferred Pharmacy (with phone number or street name): [id number] [place], [place] - [address] [phone] (Phone) [phone] (Fax)    Agent: Please be advised that RX refills may take up to 3 business days. We ask that you follow-up with your pharmacy.

## 2018-07-07 NOTE — Progress Notes (Signed)
Acute Office Visit  Subjective:    Patient ID: Cathy Cole, female    DOB: October 31, 1970, 48 y.o.   MRN: [id number]  Chief Complaint  Patient presents with  . Vaginal Discharge    pt states she has noticed a vaginal discharge x 1 week possible BV    HPI Patient is in today for vaginal discharge. Pt states this is an ongoing problems with discharge noted on undergarments and with wiping. No odor. Itching noted. Pt states LMP last week normal. Same partner. No concerns for STD. Pt states she works in [employer], she does not take baths or douche.   Past Medical History:  Diagnosis Date  . Abnormal uterine bleeding (AUB) 11/07/2014  . Allergy   . Anxiety   . Hypertension   . Menorrhagia 11/07/2014    Past Surgical History:  Procedure Laterality Date  . BUNIONECTOMY    . COSMETIC SURGERY    . NOVASURE ABLATION N/A 11/08/2014   Procedure: NOVASURE ABLATION;  Surgeon: Sherian ReinJody Bovard-Stuckert, MD;  Location: [facility];  Service: Gynecology;  Laterality: N/A;  . TUBAL LIGATION      Family History  Problem Relation Age of Onset  . Diabetes Other   . Hypertension Other   . Hypertension Mother   . Diabetes Maternal Grandmother   . Hypertension Maternal Grandmother     Social History   Socioeconomic History  . Marital status: Divorced    Spouse name: Not on file  . Number of children: 3  . Years of education: Not on file  . Highest education level: Not on file  Occupational History  . Not on file  Social Needs  . Financial resource strain: Not on file  . Food insecurity:    Worry: Not on file    Inability: Not on file  . Transportation needs:    Medical: Not on file    Non-medical: Not on file  Tobacco Use  . Smoking status: Never Smoker  . Smokeless tobacco: Never Used  Substance and Sexual Activity  . Alcohol use: No  . Drug use: Never  . Sexual activity: Yes  Lifestyle  . Physical activity:    Days per week: Not on file    Minutes per session: Not on file  .  Stress: Not on file  Relationships  . Social connections:    Talks on phone: Not on file    Gets together: Not on file    Attends religious service: Not on file    Active member of club or organization: Not on file    Attends meetings of clubs or organizations: Not on file    Relationship status: Not on file  . Intimate partner violence:    Fear of current or ex partner: Not on file    Emotionally abused: Not on file    Physically abused: Not on file    Forced sexual activity: Not on file  Other Topics Concern  . Not on file  Social History Narrative  . Not on file    Outpatient Medications Prior to Visit  Medication Sig Dispense Refill  . acetaminophen (TYLENOL) 500 MG tablet Take 1,000 mg by mouth every 6 (six) hours as needed for mild pain, moderate pain or headache.    . Cholecalciferol (VITAMIN D3) 2000 UNITS TABS Take 2,000 Units by mouth daily.     . hydrochlorothiazide (MICROZIDE) 12.5 MG capsule Take 1 capsule (12.5 mg total) by mouth daily. 90 capsule 0  . LORazepam (ATIVAN) 0.5  MG tablet Take 1 tablet (0.5 mg total) by mouth daily as needed for anxiety. 30 tablet 0  . metoprolol succinate (TOPROL-XL) 25 MG 24 hr tablet Take 1 tablet (25 mg total) by mouth daily. 90 tablet 3   No facility-administered medications prior to visit.     Allergies  Allergen Reactions  . Bactrim [Sulfamethoxazole-Trimethoprim] Anaphylaxis    Chest pain    ROS CONSTITUTIONAL: no  fever GI: no abdominal pain GU: pain with urination, urinary frequency, urgency,  bladder problems    Objective:    Physical Exam  BP 130/82   Pulse 94   Temp 98 F (36.7 C) (Oral)   Resp 16   Ht 5' 4.96" (1.65 m)   Wt 184 lb (83.5 kg)   LMP 06/19/2018 (Approximate)   SpO2 98%   BMI 30.66 kg/m  Wt Readings from Last 3 Encounters:  07/07/18 184 lb (83.5 kg)  05/10/18 179 lb 9.6 oz (81.5 kg)  02/28/18 177 lb 4 oz (80.4 kg)   WDWNF-NAD GU-no cervical motion tenderness, white/grey discharge  noted-no odor, no blood  No results found for: TSH Lab Results  Component Value Date   WBC 10.8 (H) 02/28/2018   HGB 13.3 02/28/2018   HCT 40.1 02/28/2018   MCV 88.1 02/28/2018   PLT 374 02/28/2018   Lab Results  Component Value Date   NA 140 02/28/2018   K 3.4 (L) 02/28/2018   CO2 26 02/28/2018   GLUCOSE 96 02/28/2018   BUN 15 02/28/2018   CREATININE 0.80 02/28/2018   BILITOT 0.5 02/17/2018   ALKPHOS 86 02/17/2018   AST 13 02/17/2018   ALT 15 02/17/2018   PROT 7.5 02/17/2018   ALBUMIN 4.4 02/17/2018   CALCIUM 9.2 02/28/2018   ANIONGAP 11 02/28/2018   Lab Results  Component Value Date   CHOL 127 02/17/2018   Lab Results  Component Value Date   HDL 52 02/17/2018   Lab Results  Component Value Date   LDLCALC 50 02/17/2018   Lab Results  Component Value Date   TRIG 124 02/17/2018   Lab Results  Component Value Date   CHOLHDL 2.4 02/17/2018      Assessment & Plan:   Problem List Items Addressed This Visit    None    Visit Diagnoses    Vaginal discharge    -  Primary    h/o BV-no clue cells on self swab -or swab collection in office. WBC noted-GC, CL, Tric ordered -sent to lab Flagyl -rx, risk/benefit/side effects, diflucan for possible candida after flagyl. Pt with recurrent BV-discussed Boric Acid if recurrent.  LISA Mat Carne, MD  07-07-18

## 2018-07-07 NOTE — Telephone Encounter (Signed)
Done today by Dr. Judee Clara

## 2018-07-07 NOTE — Patient Instructions (Addendum)
   Will notify when labwork available   If you have lab work done today you will be contacted with your lab results within the next 2 weeks.  If you have not heard from [place] then please contact us. The fastest way to get your results is to register for My Chart.   IF you received an x-ray today, you will receive an invoice from [facility]. Please contact [facility] at [phone] with questions or concerns regarding your invoice.   IF you received labwork today, you will receive an invoice from [facility]. Please contact [facility] at [phone] with questions or concerns regarding your invoice.   Our billing staff will not be able to assist you with questions regarding bills from these companies.  You will be contacted with the lab results as soon as they are available. The fastest way to get your results is to activate your My Chart account. Instructions are located on the last page of this paperwork. If you have not heard from [place] regarding the results in 2 weeks, please contact this office.

## 2018-07-07 NOTE — Telephone Encounter (Signed)
Thanks

## 2018-07-08 LAB — CERVICOVAGINAL ANCILLARY ONLY
Chlamydia: NEGATIVE
Neisseria Gonorrhea: NEGATIVE
Trichomonas: NEGATIVE

## 2018-07-09 NOTE — Progress Notes (Signed)
Pt convert to office visit

## 2018-08-10 ENCOUNTER — Telehealth (INDEPENDENT_AMBULATORY_CARE_PROVIDER_SITE_OTHER): Payer: BLUE CROSS/BLUE SHIELD | Admitting: Emergency Medicine

## 2018-08-10 ENCOUNTER — Other Ambulatory Visit: Payer: Self-pay

## 2018-08-10 ENCOUNTER — Encounter: Payer: Self-pay | Admitting: Emergency Medicine

## 2018-08-10 DIAGNOSIS — I10 Essential (primary) hypertension: Secondary | ICD-10-CM

## 2018-08-10 MED ORDER — HYDROCHLOROTHIAZIDE 12.5 MG PO CAPS: 12.5000 mg | ORAL_CAPSULE | Freq: Every day | ORAL | 3 refills | Status: DC

## 2018-08-10 MED ORDER — METOPROLOL SUCCINATE ER 25 MG PO TB24: 25.0000 mg | ORAL_TABLET | Freq: Every day | ORAL | 3 refills | Status: DC

## 2018-08-10 NOTE — Progress Notes (Signed)
Telemedicine Encounter- SOAP NOTE Established Patient  This telephone encounter was conducted with the patient's (or proxy's) verbal consent via audio telecommunications: yes/no: Yes Patient was instructed to have this encounter in a suitably private space; and to only have persons present to whom they give permission to participate. In addition, patient identity was confirmed by use of name plus two identifiers (DOB and address).  I discussed the limitations, risks, security and privacy concerns of performing an evaluation and management service by telephone and the availability of in person appointments. I also discussed with the patient that there may be a patient responsible charge related to this service. The patient expressed understanding and agreed to proceed.  I spent a total of TIME; 0 MIN TO 60 MIN: 15 minutes talking with the patient or their proxy.  No chief complaint on file. Follow-up of hypertension Subjective   Cathy Cole is a 48 y.o. female established patient. Telephone visit today for follow-up of hypertension.  Taking hydrochlorothiazide and metoprolol XL.  Compliant with medications.  Blood pressure readings at home within normal limits, 118/78, 121/80, self-reported.  Asymptomatic.  Has no complaints or medical concerns today.  HPI   Patient Active Problem List   Diagnosis Date Noted   Generalized anxiety disorder 05/10/2018   Vaginal discharge 02/08/2018    Past Medical History:  Diagnosis Date   Abnormal uterine bleeding (AUB) 11/07/2014   Allergy    Anxiety    Hypertension    Menorrhagia 11/07/2014    Current Outpatient Medications  Medication Sig Dispense Refill   acetaminophen (TYLENOL) 500 MG tablet Take 1,000 mg by mouth every 6 (six) hours as needed for mild pain, moderate pain or headache.     Cholecalciferol (VITAMIN D3) 2000 UNITS TABS Take 2,000 Units by mouth daily.      hydrochlorothiazide (MICROZIDE) 12.5 MG capsule Take 1  capsule (12.5 mg total) by mouth daily. 90 capsule 3   LORazepam (ATIVAN) 0.5 MG tablet Take 1 tablet (0.5 mg total) by mouth daily as needed for anxiety. 30 tablet 0   metoprolol succinate (TOPROL-XL) 25 MG 24 hr tablet Take 1 tablet (25 mg total) by mouth daily. 90 tablet 3   metroNIDAZOLE (FLAGYL) 500 MG tablet Take 1 tablet (500 mg total) by mouth 2 (two) times daily. 14 tablet 0   No current facility-administered medications for this visit.     Allergies  Allergen Reactions   Bactrim [Sulfamethoxazole-Trimethoprim] Anaphylaxis    Chest pain    Social History   Socioeconomic History   Marital status: Divorced    Spouse name: Not on file   Number of children: 3   Years of education: Not on file   Highest education level: Not on file  Occupational History   Not on file  Social Needs   Financial resource strain: Not on file   Food insecurity:    Worry: Not on file    Inability: Not on file   Transportation needs:    Medical: Not on file    Non-medical: Not on file  Tobacco Use   Smoking status: Never Smoker   Smokeless tobacco: Never Used  Substance and Sexual Activity   Alcohol use: No   Drug use: Never   Sexual activity: Yes  Lifestyle   Physical activity:    Days per week: Not on file    Minutes per session: Not on file   Stress: Not on file  Relationships   Social connections:    Talks  on phone: Not on file    Gets together: Not on file    Attends religious service: Not on file    Active member of club or organization: Not on file    Attends meetings of clubs or organizations: Not on file    Relationship status: Not on file   Intimate partner violence:    Fear of current or ex partner: Not on file    Emotionally abused: Not on file    Physically abused: Not on file    Forced sexual activity: Not on file  Other Topics Concern   Not on file  Social History Narrative   Not on file    Review of Systems  Constitutional: Negative.   Negative for chills and fever.  HENT: Negative.  Negative for congestion, nosebleeds and sore throat.   Eyes: Negative.  Negative for blurred vision and double vision.  Respiratory: Negative.  Negative for cough and shortness of breath.   Cardiovascular: Negative.  Negative for chest pain and palpitations.  Gastrointestinal: Negative for abdominal pain, blood in stool, diarrhea, melena, nausea and vomiting.  Genitourinary: Negative.   Musculoskeletal: Negative for myalgias.  Skin: Negative.  Negative for rash.  Neurological: Negative for dizziness and headaches.  Endo/Heme/Allergies: Negative.   All other systems reviewed and are negative.   Objective   Vitals as reported by the patient: Today's Vitals   08/10/18 0757  Weight: 181 lb (82.1 kg)  Height: 5\' 4"  (1.626 m)  Awake and oriented x3 in no apparent respiratory distress.  Diagnoses and all orders for this visit:  Essential hypertension -     metoprolol succinate (TOPROL-XL) 25 MG 24 hr tablet; Take 1 tablet (25 mg total) by mouth daily. -     hydrochlorothiazide (MICROZIDE) 12.5 MG capsule; Take 1 capsule (12.5 mg total) by mouth daily.  Clinically stable.  No medical concerns identified during this visit. Hypertension and cardiovascular risks associated with it discussed with patient as well as nutrition in particular DASHdiet. Encouraged to exercise. COVID-19 precautions given. Follow-up in 6 months.   I discussed the assessment and treatment plan with the patient. The patient was provided an opportunity to ask questions and all were answered. The patient agreed with the plan and demonstrated an understanding of the instructions.   The patient was advised to call back or seek an in-person evaluation if the symptoms worsen or if the condition fails to improve as anticipated.  I provided 15 minutes of non-face-to-face time during this encounter.  Georgina QuintMiguel Jose Danisa Kopec, MD  [facility]

## 2018-09-08 ENCOUNTER — Ambulatory Visit: Payer: Self-pay | Admitting: Emergency Medicine

## 2018-09-08 NOTE — Telephone Encounter (Signed)
Pt reports left sided chest pain, "Between breat and collarbone" with movement only. States "Pinching type pain." States worse in mornings when first moving about. Does not radiate, normal ROM left arm per pt.  Denies nausea, dizziness, SOB. No diaphoresis.  Does not worsen with exertion. Pt states started a new job recently that requires a lot of lifting. Call transferred to practice, [facility], for consideration of appt.  Reason for Disposition . [1] Chest pain(s) lasting a few seconds AND [2] persists > 3 days  Answer Assessment - Initial Assessment Questions 1. LOCATION: "Where does it hurt?"       Above breast in between collar bone, left side 2. RADIATION: "Does the pain go anywhere else?" (e.g., into neck, jaw, arms, back)     no 3. ONSET: "When did the chest pain begin?" (Minutes, hours or days)      1 week ago 4. PATTERN "Does the pain come and go, or has it been constant since it started?"  "Does it get worse with exertion?"      Constant, worse in mornings with movement 5. DURATION: "How long does it last" (e.g., seconds, minutes, hours)     Hurts with twisting 6. SEVERITY: "How bad is the pain?"  (e.g., Scale 1-10; mild, moderate, or severe)    - MILD (1-3): doesn't interfere with normal activities     - MODERATE (4-7): interferes with normal activities or awakens from sleep    - SEVERE (8-10): excruciating pain, unable to do any normal activities       7/10 with movement 7. CARDIAC RISK FACTORS: "Do you have any history of heart problems or risk factors for heart disease?" (e.g., prior heart attack, angina; high blood pressure, diabetes, being overweight, high cholesterol, smoking, or strong family history of heart disease)     no 8. PULMONARY RISK FACTORS: "Do you have any history of lung disease?"  (e.g., blood clots in lung, asthma, emphysema, birth control pills)     no 9. CAUSE: "What do you think is causing the chest pain?"     Maybe new job with lifting 10. OTHER SYMPTOMS:  "Do you have any other symptoms?" (e.g., dizziness, nausea, vomiting, sweating, fever, difficulty breathing, cough)     Fatigued  Protocols used: CHEST PAIN-A-AH

## 2018-09-09 ENCOUNTER — Other Ambulatory Visit: Payer: Self-pay

## 2018-09-09 ENCOUNTER — Telehealth (INDEPENDENT_AMBULATORY_CARE_PROVIDER_SITE_OTHER): Payer: BC Managed Care – PPO | Admitting: Emergency Medicine

## 2018-09-09 ENCOUNTER — Encounter: Payer: Self-pay | Admitting: Emergency Medicine

## 2018-09-09 VITALS — BP 124/80 | Ht 64.0 in | Wt 182.0 lb

## 2018-09-09 DIAGNOSIS — M940 Chondrocostal junction syndrome [Tietze]: Secondary | ICD-10-CM | POA: Diagnosis not present

## 2018-09-09 MED ORDER — MELOXICAM 7.5 MG PO TABS: 7.5000 mg | ORAL_TABLET | Freq: Every day | ORAL | 1 refills | Status: AC

## 2018-09-09 NOTE — Progress Notes (Signed)
Called patient to triage for appointment. Patient states she is having left side pain since 08/31/2018, the pain radiates to upper back with spasms to left shoulder blade. Patient states she does not have abdominal pain. Also, patient is complaining of headaches off and on for one month. Patient states no nausea or blurred vision.

## 2018-09-09 NOTE — Progress Notes (Signed)
Telemedicine Encounter- SOAP NOTE Established Patient  This telephone encounter was conducted with the patient's (or proxy's) verbal consent via audio telecommunications: yes/no: Yes Patient was instructed to have this encounter in a suitably private space; and to only have persons present to whom they give permission to participate. In addition, patient identity was confirmed by use of name plus two identifiers (DOB and address).  I discussed the limitations, risks, security and privacy concerns of performing an evaluation and management service by telephone and the availability of in person appointments. I also discussed with the patient that there may be a patient responsible charge related to this service. The patient expressed understanding and agreed to proceed.  I spent a total of TIME; 0 MIN TO 60 MIN: 15 minutes talking with the patient or their proxy.  No chief complaint on file.  Pain to left side of her chest Subjective   Cathy Cole is a 48 y.o. female established patient. Telephone visit today complaining of constant sharp left-sided chest pain for the last week without any other associated symptoms.  Worse with movement and better with rest.  Area hurts to touch.  No history of angina.  History of hypertension.  No history of diabetes.  Denies difficulty breathing, cough, flulike symptoms, fever or chills, nausea or vomiting, or any other significant symptoms.  Able to eat and drink normal.  Denies skin rash.  Missed work yesterday.  Requesting work note.  HPI   Patient Active Problem List   Diagnosis Date Noted  . Essential hypertension 08/10/2018  . Generalized anxiety disorder 05/10/2018    Past Medical History:  Diagnosis Date  . Abnormal uterine bleeding (AUB) 11/07/2014  . Allergy   . Anxiety   . Hypertension   . Menorrhagia 11/07/2014    Current Outpatient Medications  Medication Sig Dispense Refill  . acetaminophen (TYLENOL) 500 MG tablet Take 1,000  mg by mouth every 6 (six) hours as needed for mild pain, moderate pain or headache.    . hydrochlorothiazide (MICROZIDE) 12.5 MG capsule Take 1 capsule (12.5 mg total) by mouth daily. 90 capsule 3  . LORazepam (ATIVAN) 0.5 MG tablet Take 1 tablet (0.5 mg total) by mouth daily as needed for anxiety. 30 tablet 0  . metoprolol succinate (TOPROL-XL) 25 MG 24 hr tablet Take 1 tablet (25 mg total) by mouth daily. 90 tablet 3  . Cholecalciferol (VITAMIN D3) 2000 UNITS TABS Take 2,000 Units by mouth daily.     . metroNIDAZOLE (FLAGYL) 500 MG tablet Take 1 tablet (500 mg total) by mouth 2 (two) times daily. 14 tablet 0   No current facility-administered medications for this visit.     Allergies  Allergen Reactions  . Bactrim [Sulfamethoxazole-Trimethoprim] Anaphylaxis    Chest pain    Social History   Socioeconomic History  . Marital status: Divorced    Spouse name: Not on file  . Number of children: 3  . Years of education: Not on file  . Highest education level: Not on file  Occupational History  . Not on file  Social Needs  . Financial resource strain: Not on file  . Food insecurity    Worry: Not on file    Inability: Not on file  . Transportation needs    Medical: Not on file    Non-medical: Not on file  Tobacco Use  . Smoking status: Never Smoker  . Smokeless tobacco: Never Used  Substance and Sexual Activity  . Alcohol use: No  .  Drug use: Never  . Sexual activity: Yes  Lifestyle  . Physical activity    Days per week: Not on file    Minutes per session: Not on file  . Stress: Not on file  Relationships  . Social Musicianconnections    Talks on phone: Not on file    Gets together: Not on file    Attends religious service: Not on file    Active member of club or organization: Not on file    Attends meetings of clubs or organizations: Not on file    Relationship status: Not on file  . Intimate partner violence    Fear of current or ex partner: Not on file    Emotionally  abused: Not on file    Physically abused: Not on file    Forced sexual activity: Not on file  Other Topics Concern  . Not on file  Social History Narrative  . Not on file    Review of Systems  Constitutional: Negative.  Negative for chills and fever.  HENT: Negative.  Negative for congestion and sore throat.   Eyes: Negative.   Respiratory: Negative.  Negative for cough, hemoptysis and shortness of breath.   Cardiovascular: Positive for chest pain. Negative for palpitations and leg swelling.  Gastrointestinal: Negative.  Negative for abdominal pain, diarrhea, nausea and vomiting.  Genitourinary: Negative.  Negative for dysuria.  Musculoskeletal: Positive for back pain. Negative for myalgias.  Skin: Negative.  Negative for rash.  Neurological: Negative.  Negative for dizziness and headaches.  Endo/Heme/Allergies: Negative.   All other systems reviewed and are negative.   Objective   Vitals as reported by the patient: Today's Vitals   09/09/18 1031  BP: 124/80  Weight: 182 lb (82.6 kg)  Height: 5\' 4"  (1.626 m)  Awake and oriented x3 in no apparent respiratory distress.  There are no diagnoses linked to this encounter.  Diagnoses and all orders for this visit:  Acute costochondritis -     meloxicam (MOBIC) 7.5 MG tablet; Take 1 tablet (7.5 mg total) by mouth daily for 14 days.    Clinically stable.  No red flag signs or symptoms.  This pain does not appear to be cardiac in origin.  Costochondritis most likely. Work note provided. I discussed the assessment and treatment plan with the patient. The patient was provided an opportunity to ask questions and all were answered. The patient agreed with the plan and demonstrated an understanding of the instructions.   The patient was advised to call back or seek an in-person evaluation if the symptoms worsen or if the condition fails to improve as anticipated.  I provided 15 minutes of non-face-to-face time during this encounter.   Georgina QuintMiguel Jose Masiah Lewing, MD  Primary [facility] at [facility]

## 2018-09-13 DIAGNOSIS — H524 Presbyopia: Secondary | ICD-10-CM | POA: Diagnosis not present

## 2018-10-27 ENCOUNTER — Other Ambulatory Visit: Payer: Self-pay | Admitting: Emergency Medicine

## 2018-10-27 NOTE — Telephone Encounter (Signed)
Okay with me but BV needs to be treated with oral pills.  Flagyl 500 mg twice a day for 7 days.  Thanks.

## 2018-10-27 NOTE — Telephone Encounter (Signed)
Pt requesting prescription for bacterial vaginosis w/o office visit. Pt stated she wanted a cream. Please advise.

## 2018-10-27 NOTE — Telephone Encounter (Signed)
Pt is requesting tx for bacterial vaginosis, does not want an appt. Reoccurring issue. Do you approve. Last given bt [place]

## 2018-10-28 ENCOUNTER — Other Ambulatory Visit: Payer: Self-pay

## 2018-10-28 DIAGNOSIS — N760 Acute vaginitis: Secondary | ICD-10-CM

## 2018-10-28 DIAGNOSIS — B9689 Other specified bacterial agents as the cause of diseases classified elsewhere: Secondary | ICD-10-CM

## 2018-10-28 MED ORDER — METRONIDAZOLE 500 MG PO TABS: 500.0000 mg | ORAL_TABLET | Freq: Two times a day (BID) | ORAL | 0 refills | Status: DC

## 2018-10-28 NOTE — Telephone Encounter (Signed)
Med sent flagyl 500mg  7 days bid

## 2018-11-15 NOTE — Telephone Encounter (Signed)
Patient states she cannot take Flagyl (states it gives her a headache), she inquired if she is able to use gel instead .

## 2018-11-17 NOTE — Telephone Encounter (Signed)
Pt would like a gel because the flagyl pills make her have a headache.

## 2018-11-17 NOTE — Telephone Encounter (Signed)
When did I see her for bacterial vaginosis?  Who prescribed this for her?  Thanks.

## 2018-11-17 NOTE — Telephone Encounter (Signed)
Patient checking on the status of message below, please advise [phone]

## 2018-11-18 NOTE — Telephone Encounter (Signed)
Copied from [place] [phone]. Topic: General - Other >> Nov 18, 2018  3:33 PM Mcneil, Ja-Kwan wrote: Reason for CRM: Pt called again for an update on her request to change the Rx for Flagyl to the gel. Pt requests call back.

## 2018-11-24 NOTE — Telephone Encounter (Signed)
Pt called and is requesting to speak with someone about this gel she is still needing sent in. Please advise.

## 2018-12-03 NOTE — Telephone Encounter (Signed)
Called pt, no answer.

## 2019-01-17 ENCOUNTER — Emergency Department (HOSPITAL_COMMUNITY)
Admission: EM | Admit: 2019-01-17 | Discharge: 2019-01-17 | Disposition: A | Discharge status: Home / Self Care | Payer: BLUE CROSS/BLUE SHIELD | Attending: Emergency Medicine | Admitting: Emergency Medicine

## 2019-01-17 ENCOUNTER — Other Ambulatory Visit: Payer: Self-pay

## 2019-01-17 ENCOUNTER — Encounter (HOSPITAL_COMMUNITY): Payer: Self-pay

## 2019-01-17 ENCOUNTER — Emergency Department (HOSPITAL_COMMUNITY): Payer: BLUE CROSS/BLUE SHIELD

## 2019-01-17 DIAGNOSIS — R079 Chest pain, unspecified: Secondary | ICD-10-CM | POA: Diagnosis not present

## 2019-01-17 DIAGNOSIS — R0789 Other chest pain: Secondary | ICD-10-CM | POA: Diagnosis not present

## 2019-01-17 DIAGNOSIS — I10 Essential (primary) hypertension: Secondary | ICD-10-CM | POA: Insufficient documentation

## 2019-01-17 DIAGNOSIS — Z79899 Other long term (current) drug therapy: Secondary | ICD-10-CM | POA: Insufficient documentation

## 2019-01-17 LAB — CBC
HCT: 43.2 % (ref 36.0–46.0)
Hemoglobin: 14.1 g/dL (ref 12.0–15.0)
MCH: 29.7 pg (ref 26.0–34.0)
MCHC: 32.6 g/dL (ref 30.0–36.0)
MCV: 90.9 fL (ref 80.0–100.0)
Platelets: 367 10*3/uL (ref 150–400)
RBC: 4.75 MIL/uL (ref 3.87–5.11)
RDW: 12.8 % (ref 11.5–15.5)
WBC: 11 10*3/uL — ABNORMAL HIGH (ref 4.0–10.5)
nRBC: 0 % (ref 0.0–0.2)

## 2019-01-17 LAB — BASIC METABOLIC PANEL
Anion gap: 11 (ref 5–15)
BUN: 16 mg/dL (ref 6–20)
CO2: 25 mmol/L (ref 22–32)
Calcium: 9.1 mg/dL (ref 8.9–10.3)
Chloride: 101 mmol/L (ref 98–111)
Creatinine, Ser: 0.88 mg/dL (ref 0.44–1.00)
GFR calc Af Amer: >60 mL/min (ref >60)
GFR calc non Af Amer: >60 mL/min (ref >60)
Glucose, Bld: 102 mg/dL — ABNORMAL HIGH (ref 70–99)
Potassium: 3.3 mmol/L — ABNORMAL LOW (ref 3.5–5.1)
Sodium: 137 mmol/L (ref 135–145)

## 2019-01-17 LAB — I-STAT BETA HCG BLOOD, ED (NOT ORDERABLE): I-stat hCG, quantitative: <5 m[IU]/mL (ref <5)

## 2019-01-17 LAB — TROPONIN I (HIGH SENSITIVITY)
Troponin I (High Sensitivity): <2 ng/L (ref <18)
Troponin I (High Sensitivity): <2 ng/L (ref <18)

## 2019-01-17 MED ORDER — DIPHENHYDRAMINE HCL 50 MG/ML IJ SOLN
25.0000 mg | Freq: Once | INTRAMUSCULAR | Status: AC
  Administered 2019-01-17: 25 mg via INTRAVENOUS
  Filled 2019-01-17: qty 1

## 2019-01-17 MED ORDER — METOCLOPRAMIDE HCL 5 MG/ML IJ SOLN
10.0000 mg | Freq: Once | INTRAMUSCULAR | Status: AC
  Administered 2019-01-17: 10 mg via INTRAVENOUS
  Filled 2019-01-17: qty 2

## 2019-01-17 MED ORDER — SODIUM CHLORIDE 0.9 % IV BOLUS
500.0000 mL | Freq: Once | INTRAVENOUS | Status: AC
  Administered 2019-01-17: 500 mL via INTRAVENOUS

## 2019-01-17 MED ORDER — SODIUM CHLORIDE 0.9% FLUSH: 3.0000 mL | Freq: Once | INTRAVENOUS | Status: DC

## 2019-01-17 NOTE — Discharge Instructions (Addendum)
As we discussed today, your work-up was reassuring.  Your blood pressure is improved.  As we discussed, please monitor your blood pressure closely and follow-up with your primary care doctor to see if your medications need to be adjusted.  Return the emergency department for any worsening chest pain, difficulty breathing, numbness/weakness of arms or legs, vision changes or any other worsening concerning symptoms.

## 2019-01-17 NOTE — ED Notes (Signed)
Pt ambulatory from triage to bathroom and room with a steady gait. No assistance required.

## 2019-01-17 NOTE — ED Provider Notes (Signed)
Care handoff received from Providence Lanius, PA-C at shift change please see her note for full details of visit.  In short patient presented with chest pain and headache both of which resolved by medications given by previous team.  Reassuring work-up as below.  Initial high-sensitivity troponin: Less than 2 Beta-hCG negative CBC nonacute BMP nonacute EKG reviewed by Dr. Nicholes Stairs without acute finding Chest x-ray:  IMPRESSION:  No active cardiopulmonary disease.   Per previous providers MDM chest pain atypical in nature, PERC negative.  Heart score 2.  Additionally headache improved following migraine cocktail without red flags.  Patient to follow-up with PCP for elevated blood pressure reading today, symptoms not thought to be related to hypertensive urgency/emergency. - Plan of care at shift change is to follow-up on the pending delta troponin, if negative discharge. Physical Exam  BP 116/84   Pulse 72   Temp 98.1 F (36.7 C) (Oral)   Resp 15   Ht 5\' 9"  (1.753 m)   Wt 81.6 kg   LMP 11/24/2018   SpO2 100%   BMI 26.58 kg/m   Physical Exam Constitutional:      General: She is not in acute distress.    Appearance: Normal appearance. She is well-developed. She is not ill-appearing or diaphoretic.  HENT:     Head: Normocephalic and atraumatic.     Right Ear: External ear normal.     Left Ear: External ear normal.     Nose: Nose normal.  Eyes:     General: Vision grossly intact. Gaze aligned appropriately.  Neck:     Musculoskeletal: Normal range of motion.     Trachea: Trachea and phonation normal. No tracheal deviation.  Pulmonary:     Effort: Pulmonary effort is normal. No respiratory distress.  Abdominal:     General: There is no distension.  Musculoskeletal: Normal range of motion.  Skin:    General: Skin is warm and dry.  Neurological:     Mental Status: She is alert.     GCS: GCS eye subscore is 4. GCS verbal subscore is 5. GCS motor subscore is 6.     Comments:  Speech is clear and goal oriented, follows commands Major Cranial nerves without deficit, no facial droop Moves extremities without ataxia, coordination intact  Psychiatric:        Behavior: Behavior normal.     ED Course/Procedures     Procedures  MDM  Delta high-sensitivity troponin: Less than 2 - Patient reassessed, sleeping easily arousable to voice.  She reports that she feels well and has no complaints.  She states understanding of care plan verbalized to previous provider.  She has no further questions and is agreeable to discharge.  Per previous team plan patient discharged.  After visit summary completed by previous team given to patient.  At this time there does not appear to be any evidence of an acute emergency medical condition and the patient appears stable for discharge with appropriate outpatient follow up. Diagnosis was discussed with patient who verbalizes understanding of care plan and is agreeable to discharge. I have discussed return precautions with patient who verbalizes understanding of return precautions. Patient encouraged to follow-up with their PCP. All questions answered.  Note: Portions of this report may have been transcribed using voice recognition software. Every effort was made to ensure accuracy; however, inadvertent computerized transcription errors may still be present.   Gari Crown 01/17/19 0745    Lacretia Leigh, MD 01/17/19 [phone]

## 2019-01-17 NOTE — ED Triage Notes (Signed)
Pt arrived stating that yesterday she began having left sided chest pain and a headache. Reports checking her blood pressure at home and it was elevated. Pt denies any other symptoms at this time.

## 2019-01-17 NOTE — ED Provider Notes (Signed)
[facility] DEPT Provider Note   CSN: [id number] Arrival date & time: 01/17/19  5176     History   Chief Complaint Chief Complaint  Patient presents with   Hypertension   Chest Pain    HPI Cathy Cole is a 48 y.o. female past medical history of anxiety, hypertension who presents for evaluation of high blood pressure, headache, chest pain.  She reports that she started having some chest pain yesterday.  She reports that she was at home watching TV when she started developing some chest pain that she describes as an ache.  She states that there was no associated nausea, vomiting, diaphoresis.  No associated shortness of breath.  She states that this pain occurs randomly and is not associated with any action.  She is not taking any medications for the pain.  She states that the pain is not worse with deep inspiration or exertion.  She has not had any associated shortness of breath.  Patient states that she also reports that she had headache that began about 3 days ago.  She reports she does get frequent headaches and states that this feels similar to her previous headaches.  No preceding trauma, injury, fall.  She states that this headache started gradual in nature and worsened.  No thunderclap headache.  She took ibuprofen with no improvement in symptoms.  Additionally, patient reports that she checked her blood pressure and noted it to be slightly elevated.  She reports that yesterday, was elevated with systolic blood pressure in the 130s.  She states that she kept checking it and it kept going up to 140 and then to 160.  She reports that the highest it got was 167.  Patient states she is not a current smoker.  Denies any illicit drug use.  Denies any personal cardiac history or family history of MI before the age of 39.  Patient denies any fevers, cough, congestion, abdominal pain, nausea/vomiting, vision changes, numbness/weakness. She denies any OCP use, recent  immobilization, prior history of DVT/PE, recent surgery, leg swelling, or long travel.  HPI: A 48 year old patient with a history of hypertension presents for evaluation of chest pain. Initial onset of pain was approximately 1-3 hours ago. The patient's chest pain is not worse with exertion. The patient's chest pain is not middle- or left-sided, is not well-localized, is not described as heaviness/pressure/tightness, is not sharp and does not radiate to the arms/jaw/neck. The patient does not complain of nausea and denies diaphoresis. The patient has no history of stroke, has no history of peripheral artery disease, has not smoked in the past 90 days, denies any history of treated diabetes, has no relevant family history of coronary artery disease (first degree relative at less than age 58), has no history of hypercholesterolemia and does not have an elevated BMI (>=30).   The history is provided by the patient.    Past Medical History:  Diagnosis Date   Abnormal uterine bleeding (AUB) 11/07/2014   Allergy    Anxiety    Hypertension    Menorrhagia 11/07/2014    Patient Active Problem List   Diagnosis Date Noted   Essential hypertension 08/10/2018   Generalized anxiety disorder 05/10/2018    Past Surgical History:  Procedure Laterality Date   BUNIONECTOMY     COSMETIC SURGERY     NOVASURE ABLATION N/A 11/08/2014   Procedure: NOVASURE ABLATION;  Surgeon: Janyth Contes, MD;  Location: [facility];  Service: Gynecology;  Laterality: N/A;  TUBAL LIGATION       OB History   No obstetric history on file.      Home Medications    Prior to Admission medications   Medication Sig Start Date End Date Taking? Authorizing Provider  acetaminophen (TYLENOL) 500 MG tablet Take 1,000 mg by mouth every 6 (six) hours as needed for mild pain, moderate pain or headache.   Yes [provider]  hydrochlorothiazide (MICROZIDE) 12.5 MG capsule Take 1 capsule (12.5 mg total) by  mouth daily. 08/10/18  Yes Sagardia, Eilleen Kempf, MD  ibuprofen (ADVIL) 200 MG tablet Take 400 mg by mouth every 6 (six) hours as needed for headache, mild pain, moderate pain or cramping.   Yes [provider]  LORazepam (ATIVAN) 0.5 MG tablet Take 1 tablet (0.5 mg total) by mouth daily as needed for anxiety. 05/10/18  Yes Sagardia, Eilleen Kempf, MD  metoprolol succinate (TOPROL-XL) 25 MG 24 hr tablet Take 1 tablet (25 mg total) by mouth daily. 08/10/18  Yes Sagardia, Eilleen Kempf, MD  Cholecalciferol (VITAMIN D3) 2000 UNITS TABS Take 2,000 Units by mouth daily.     [provider]  metroNIDAZOLE (FLAGYL) 500 MG tablet Take 1 tablet (500 mg total) by mouth 2 (two) times daily. Patient not taking: Reported on 01/17/2019 10/28/18   Georgina Quint, MD    Family History Family History  Problem Relation Age of Onset   Diabetes Other    Hypertension Other    Hypertension Mother    Diabetes Maternal Grandmother    Hypertension Maternal Grandmother     Social History Social History   Tobacco Use   Smoking status: Never Smoker   Smokeless tobacco: Never Used  Substance Use Topics   Alcohol use: No   Drug use: Never     Allergies   Bactrim [sulfamethoxazole-trimethoprim]   Review of Systems Review of Systems  Constitutional: Negative for fever.  Eyes: Negative for visual disturbance.  Respiratory: Negative for cough and shortness of breath.   Cardiovascular: Positive for chest pain.  Gastrointestinal: Negative for abdominal pain, nausea and vomiting.  Genitourinary: Negative for dysuria and hematuria.  Neurological: Positive for headaches. Negative for weakness and numbness.  All other systems reviewed and are negative.    Physical Exam Updated Vital Signs BP 130/82    Pulse 75    Temp 98.1 F (36.7 C) (Oral)    Resp 15    Ht  (1.753 m)    Wt 81.6 kg    LMP 11/24/2018    SpO2 100%    BMI 26.58 kg/m   Physical Exam Vitals signs and nursing  note reviewed.  Constitutional:      Appearance: Normal appearance. She is well-developed.  HENT:     Head: Normocephalic and atraumatic.  Eyes:     General: Lids are normal.     Conjunctiva/sclera: Conjunctivae normal.     Pupils: Pupils are equal, round, and reactive to light.     Comments: PERRL. EOMs intact. No nystagmus. No neglect.   Neck:     Musculoskeletal: Full passive range of motion without pain.  Cardiovascular:     Rate and Rhythm: Normal rate and regular rhythm.     Pulses: Normal pulses.     Heart sounds: Normal heart sounds. No murmur. No friction rub. No gallop.   Pulmonary:     Effort: Pulmonary effort is normal.     Breath sounds: Normal breath sounds.     Comments: Lungs clear to auscultation  bilaterally.  Symmetric chest rise.  No wheezing, rales, rhonchi. Abdominal:     Palpations: Abdomen is soft. Abdomen is not rigid.     Tenderness: There is no abdominal tenderness. There is no guarding.     Comments: Abdomen is soft, non-distended, non-tender. No rigidity, No guarding. No peritoneal signs.  Musculoskeletal: Normal range of motion.  Skin:    General: Skin is warm and dry.     Capillary Refill: Capillary refill takes less than 2 seconds.  Neurological:     Mental Status: She is alert and oriented to person, place, and time.     Comments: Cranial nerves III-XII intact Follows commands, Moves all extremities  5/5 strength to BUE and BLE  Sensation intact throughout all major nerve distributions No slurred speech. No facial droop.   Psychiatric:        Speech: Speech normal.      ED Treatments / Results  Labs (all labs ordered are listed, but only abnormal results are displayed) Labs Reviewed  BASIC METABOLIC PANEL - Abnormal; Notable for the following components:      Result Value   Potassium 3.3 (*)    Glucose, Bld 102 (*)    All other components within normal limits  CBC - Abnormal; Notable for the following components:   WBC 11.0 (*)     All other components within normal limits  I-STAT BETA HCG BLOOD, ED ([facility], [facility], [facility] ONLY)  I-STAT BETA HCG BLOOD, ED (NOT ORDERABLE)  TROPONIN I (HIGH SENSITIVITY)  TROPONIN I (HIGH SENSITIVITY)    EKG EKG Interpretation  Date/Time:  Monday January 17 2019 03:42:01 EST Ventricular Rate:  79 PR Interval:    QRS Duration: 85 QT Interval:  401 QTC Calculation: 460 R Axis:   7 Text Interpretation: Sinus rhythm Probable left atrial enlargement Confirmed by Nicanor Alcon, April ([id number]) on 01/17/2019 6:49:48 AM   Radiology Dg Chest 2 View  Result Date: 01/17/2019 CLINICAL DATA:  Chest pain EXAM: CHEST - 2 VIEW COMPARISON:  02/28/2018 FINDINGS: The heart size and mediastinal contours are within normal limits. Both lungs are clear. The visualized skeletal structures are unremarkable. IMPRESSION: No active cardiopulmonary disease. Electronically Signed   By: Katherine Mantle M.D.   On: 01/17/2019 04:06    Procedures Procedures (including critical care time)  Medications Ordered in ED Medications  sodium chloride flush (NS) 0.9 % injection 3 mL (has no administration in time range)  metoCLOPramide (REGLAN) injection 10 mg (10 mg Intravenous Given 01/17/19 0536)  sodium chloride 0.9 % bolus 500 mL (0 mLs Intravenous Stopped 01/17/19 0628)  diphenhydrAMINE (BENADRYL) injection 25 mg (25 mg Intravenous Given 01/17/19 0536)     Initial Impression / Assessment and Plan / ED Course  I have reviewed the triage vital signs and the nursing notes.  Pertinent labs & imaging results that were available during my care of the patient were reviewed by me and considered in my medical decision making (see chart for details).     HEAR Score: 29  48 year old female who presents for evaluation of chest pain, headache, high blood pressure.  Reports that headache has been ongoing for last 3 days.  Chest pain began yesterday.  She reports that that she checked her blood pressure yesterday and saw that it was  elevated with systolic blood pressure in 130s.  She reports that she kept checking it and noted it to go up, 1st-1 40s and then to 160s.  Patient states she became concerned prompting ED visit.  No vision changes, numbness/weakness. Patient is afebrile, non-toxic appearing, sitting comfortably on examination table. Vital signs reviewed and stable.  She is slightly hypertensive with systolic blood pressures 160s.  Without any intervention, this is improved to 147/88.  No neuro deficits noted on exam.  Story sounds atypical for ACS etiology.  Additionally, doubt PE. Patient is PERC negative and is low risk for PE.   She is not hypoxic or tachycardic.  Patient with no neuro deficits on exam.  History/physical exam not concerning for meningitis, venous sinus thrombosis.  Suspect migraine versus tension headache.  At this time, do not suspect hypertensive emergency.  Plan to check labs, chest x-ray.  I-stat beta negative.  CBC shows slight leukocytosis of 11.0.  Initial troponin is negative.  BMP shows potassium of 3.3.   Given patient's risk factors and presentation her HEART score is 2. Delta trop negative.   Reevaluation after migraine cocktail.  Patient reports headache is completely gone.  She also reports chest pain is gone.  Her blood pressure is improved to 130/82 without significant medications.  At this time, her exam is not concerning for hypertensive emergency.  I discussed with patient that she needs to closely monitor her blood pressure and if she continues to have significant elevations, she needs to follow-up with her primary care doctor to see if her medications need to be adjusted.  She does have anxiety and I question if some of this is related to anxiety/stress. At this time, patient exhibits no emergent life-threatening condition that require further evaluation in ED or admission. Patient had ample opportunity for questions and discussion. All patient's questions were answered with full  understanding   Portions of this note were generated with Dragon dictation software. Dictation errors may occur despite best attempts at proofreading.    Final Clinical Impressions(s) / ED Diagnoses   Final diagnoses:  Atypical chest pain  Essential hypertension    ED Discharge Orders    None       Rosana HoesLayden, Mckenna Boruff A, PA-C 01/18/19 2039    Palumbo, April, MD 01/19/19 2357

## 2019-01-19 ENCOUNTER — Encounter: Payer: Self-pay | Admitting: Emergency Medicine

## 2019-01-19 ENCOUNTER — Other Ambulatory Visit: Payer: Self-pay

## 2019-01-19 ENCOUNTER — Ambulatory Visit: Payer: BC Managed Care – PPO | Admitting: Emergency Medicine

## 2019-01-19 VITALS — BP 140/82 | HR 99 | Temp 98.1°F | Resp 16 | Ht 64.0 in | Wt 186.0 lb

## 2019-01-19 DIAGNOSIS — F411 Generalized anxiety disorder: Secondary | ICD-10-CM

## 2019-01-19 DIAGNOSIS — I10 Essential (primary) hypertension: Secondary | ICD-10-CM | POA: Diagnosis not present

## 2019-01-19 DIAGNOSIS — R079 Chest pain, unspecified: Secondary | ICD-10-CM

## 2019-01-19 MED ORDER — LOSARTAN POTASSIUM-HCTZ 50-12.5 MG PO TABS: 1.0000 | ORAL_TABLET | Freq: Every day | ORAL | 3 refills | Status: DC

## 2019-01-19 NOTE — Progress Notes (Signed)
BP Readings from Last 3 Encounters:  01/17/19 116/84  09/09/18 124/80  07/07/18 130/82   Ruthine Dose 48 y.o.   Chief Complaint  Patient presents with  . Hypertension    per patient went ED on 01/17/2019  . Chest Pain    Per patient when blood pressure is elevated    HISTORY OF PRESENT ILLNESS: This is a 48 y.o. female with history of hypertension complaining of blood pressure out of control.  Went to the emergency room on 01/17/2019 with chest pain.  Negative troponins.  Normal EKGs and normal chest x-ray.  Unremarkable work-up. Patient has been on same medication, same doses, since 2012.  Presently taking hydrochlorothiazide 12.5 mg daily and metoprolol succinate 25 mg daily. Here today for follow-up of ED visit on 01/17/2019.  Blood pressure still elevated.  No chest pain at present time. Occasionally gets localized left-sided sharp chest pain without radiation and without triggers although she feels anxiety plays a part.  No associated symptoms.  Denies chest pain on exertion, shortness of breath, diaphoresis, nausea or vomiting, or any other significant symptoms.  Has not seen cardiologist in the past 5 years. HPI   Prior to Admission medications   Medication Sig Start Date End Date Taking? Authorizing Provider  acetaminophen (TYLENOL) 500 MG tablet Take 1,000 mg by mouth every 6 (six) hours as needed for mild pain, moderate pain or headache.   Yes [provider]  hydrochlorothiazide (MICROZIDE) 12.5 MG capsule Take 1 capsule (12.5 mg total) by mouth daily. 08/10/18  Yes Marlenne Ridge, Eilleen Kempf, MD  LORazepam (ATIVAN) 0.5 MG tablet Take 1 tablet (0.5 mg total) by mouth daily as needed for anxiety. 05/10/18  Yes Priya Matsen, Eilleen Kempf, MD  metoprolol succinate (TOPROL-XL) 25 MG 24 hr tablet Take 1 tablet (25 mg total) by mouth daily. 08/10/18  Yes Barri Neidlinger, Eilleen Kempf, MD  Cholecalciferol (VITAMIN D3) 2000 UNITS TABS Take 2,000 Units by mouth daily.     [provider]  ibuprofen (ADVIL) 200 MG tablet Take 400 mg by mouth every 6 (six) hours as needed for headache, mild pain, moderate pain or cramping.    [provider]  metroNIDAZOLE (FLAGYL) 500 MG tablet Take 1 tablet (500 mg total) by mouth 2 (two) times daily. Patient not taking: Reported on 01/19/2019 10/28/18   Georgina Quint, MD    Allergies  Allergen Reactions  . Bactrim [Sulfamethoxazole-Trimethoprim] Anaphylaxis    Chest pain    Patient Active Problem List   Diagnosis Date Noted  . Essential hypertension 08/10/2018  . Generalized anxiety disorder 05/10/2018    Past Medical History:  Diagnosis Date  . Abnormal uterine bleeding (AUB) 11/07/2014  . Allergy   . Anxiety   . Hypertension   . Menorrhagia 11/07/2014    Past Surgical History:  Procedure Laterality Date  . BUNIONECTOMY    . COSMETIC SURGERY    . NOVASURE ABLATION N/A 11/08/2014   Procedure: NOVASURE ABLATION;  Surgeon: Sherian Rein, MD;  Location: [facility];  Service: Gynecology;  Laterality: N/A;  . TUBAL LIGATION      Social History   Socioeconomic History  . Marital status: Divorced    Spouse name: Not on file  . Number of children: 3  . Years of education: Not on file  . Highest education level: Not on file  Occupational History  . Not on file  Social Needs  . Financial resource strain: Not on file  . Food insecurity    Worry: Not  on file    Inability: Not on file  . Transportation needs    Medical: Not on file    Non-medical: Not on file  Tobacco Use  . Smoking status: Never Smoker  . Smokeless tobacco: Never Used  Substance and Sexual Activity  . Alcohol use: No  . Drug use: Never  . Sexual activity: Yes  Lifestyle  . Physical activity    Days per week: Not on file    Minutes per session: Not on file  . Stress: Not on file  Relationships  . Social Musician on phone: Not on file    Gets together: Not on file    Attends religious service: Not on file     Active member of club or organization: Not on file    Attends meetings of clubs or organizations: Not on file    Relationship status: Not on file  . Intimate partner violence    Fear of current or ex partner: Not on file    Emotionally abused: Not on file    Physically abused: Not on file    Forced sexual activity: Not on file  Other Topics Concern  . Not on file  Social History Narrative  . Not on file    Family History  Problem Relation Age of Onset  . Diabetes Other   . Hypertension Other   . Hypertension Mother   . Diabetes Maternal Grandmother   . Hypertension Maternal Grandmother      Review of Systems  Constitutional: Negative.  Negative for chills and fever.  HENT: Negative.  Negative for congestion and sore throat.   Respiratory: Negative.  Negative for cough and shortness of breath.   Cardiovascular: Positive for chest pain. Negative for palpitations and leg swelling.  Gastrointestinal: Negative.  Negative for abdominal pain, diarrhea, nausea and vomiting.  Genitourinary: Negative.  Negative for dysuria and hematuria.  Musculoskeletal: Positive for joint pain (Right elbow, medial aspect).  Skin: Negative.  Negative for rash.  Neurological: Negative.  Negative for dizziness and headaches.  Psychiatric/Behavioral: The patient is nervous/anxious.   All other systems reviewed and are negative.   Vitals:   01/19/19 0851  BP: (!) 156/103  Pulse: 99  Resp: 16  Temp: 98.1 F (36.7 C)  SpO2: 100%    Physical Exam Vitals signs reviewed.  HENT:     Head: Normocephalic.  Eyes:     Extraocular Movements: Extraocular movements intact.     Conjunctiva/sclera: Conjunctivae normal.     Pupils: Pupils are equal, round, and reactive to light.  Neck:     Musculoskeletal: Normal range of motion and neck supple.  Cardiovascular:     Rate and Rhythm: Normal rate and regular rhythm.     Pulses: Normal pulses.     Heart sounds: Normal heart sounds.  Pulmonary:      Effort: Pulmonary effort is normal.     Breath sounds: Normal breath sounds.  Musculoskeletal: Normal range of motion.     Comments: Right elbow: Tender medial epicondyle, full range of motion.  Otherwise normal.  Skin:    General: Skin is warm and dry.     Capillary Refill: Capillary refill takes less than 2 seconds.  Neurological:     General: No focal deficit present.     Mental Status: She is alert and oriented to person, place, and time.  Psychiatric:        Mood and Affect: Mood normal.  Behavior: Behavior normal.    EKG: Normal sinus rhythm with ventricular rate of 75/min.  No acute ischemic changes.  Normal EKG.  ASSESSMENT & PLAN: Fiorella was seen today for hypertension and chest pain.  Diagnoses and all orders for this visit:  Essential hypertension -     losartan-hydrochlorothiazide (HYZAAR) 50-12.5 MG tablet; Take 1 tablet by mouth daily. -     Ambulatory referral to Cardiology  Chest pain, unspecified type -     EKG 12-Lead -     Ambulatory referral to Cardiology  Generalized anxiety disorder    Patient Instructions       If you have lab work done today you will be contacted with your lab results within the next 2 weeks.  If you have not heard from [place] then please contact us. The fastest way to get your results is to register for My Chart.   IF you received an x-ray today, you will receive an invoice from [facility]. Please contact [facility] at [phone] with questions or concerns regarding your invoice.   IF you received labwork today, you will receive an invoice from [facility]. Please contact [facility] at [phone] with questions or concerns regarding your invoice.   Our billing staff will not be able to assist you with questions regarding bills from these companies.  You will be contacted with the lab results as soon as they are available. The fastest way to get your results is to activate your My Chart account.  Instructions are located on the last page of this paperwork. If you have not heard from [place] regarding the results in 2 weeks, please contact this office.     DASH Eating Plan DASH stands for "Dietary Approaches to Stop Hypertension." The DASH eating plan is a healthy eating plan that has been shown to reduce high blood pressure (hypertension). It may also reduce your risk for type 2 diabetes, heart disease, and stroke. The DASH eating plan may also help with weight loss. What are tips for following this plan?  General guidelines  Avoid eating more than 2,300 mg (milligrams) of salt (sodium) a day. If you have hypertension, you may need to reduce your sodium intake to 1,500 mg a day.  Limit alcohol intake to no more than 1 drink a day for nonpregnant women and 2 drinks a day for men. One drink equals 12 oz of beer, 5 oz of wine, or 1 oz of hard liquor.  Work with your health care provider to maintain a healthy body weight or to lose weight. Ask what an ideal weight is for you.  Get at least 30 minutes of exercise that causes your heart to beat faster (aerobic exercise) most days of the week. Activities may include walking, swimming, or biking.  Work with your health care provider or diet and nutrition specialist (dietitian) to adjust your eating plan to your individual calorie needs. Reading food labels   Check food labels for the amount of sodium per serving. Choose foods with less than 5 percent of the Daily Value of sodium. Generally, foods with less than 300 mg of sodium per serving fit into this eating plan.  To find whole grains, look for the word "whole" as the first word in the ingredient list. Shopping  Buy products labeled as "low-sodium" or "no salt added."  Buy fresh foods. Avoid canned foods and premade or frozen meals. Cooking  Avoid adding salt when cooking. Use salt-free seasonings or herbs instead of table salt or  sea salt. Check with your health care provider or  pharmacist before using salt substitutes.  Do not fry foods. Cook foods using healthy methods such as baking, boiling, grilling, and broiling instead.  Cook with heart-healthy oils, such as olive, canola, soybean, or sunflower oil. Meal planning  Eat a balanced diet that includes: ? 5 or more servings of fruits and vegetables each day. At each meal, try to fill half of your plate with fruits and vegetables. ? Up to 6-8 servings of whole grains each day. ? Less than 6 oz of lean meat, poultry, or fish each day. A 3-oz serving of meat is about the same size as a deck of cards. One egg equals 1 oz. ? 2 servings of low-fat dairy each day. ? A serving of nuts, seeds, or beans 5 times each week. ? Heart-healthy fats. Healthy fats called Omega-3 fatty acids are found in foods such as flaxseeds and coldwater fish, like sardines, salmon, and mackerel.  Limit how much you eat of the following: ? Canned or prepackaged foods. ? Food that is high in trans fat, such as fried foods. ? Food that is high in saturated fat, such as fatty meat. ? Sweets, desserts, sugary drinks, and other foods with added sugar. ? Full-fat dairy products.  Do not salt foods before eating.  Try to eat at least 2 vegetarian meals each week.  Eat more home-cooked food and less restaurant, buffet, and fast food.  When eating at a restaurant, ask that your food be prepared with less salt or no salt, if possible. What foods are recommended? The items listed may not be a complete list. Talk with your dietitian about what dietary choices are best for you. Grains Whole-grain or whole-wheat bread. Whole-grain or whole-wheat pasta. Brown rice. Orpah Cobbatmeal. Quinoa. Bulgur. Whole-grain and low-sodium cereals. Pita bread. Low-fat, low-sodium crackers. Whole-wheat flour tortillas. Vegetables Fresh or frozen vegetables (raw, steamed, roasted, or grilled). Low-sodium or reduced-sodium tomato and vegetable juice. Low-sodium or  reduced-sodium tomato sauce and tomato paste. Low-sodium or reduced-sodium canned vegetables. Fruits All fresh, dried, or frozen fruit. Canned fruit in natural juice (without added sugar). Meat and other protein foods Skinless chicken or [place]. Ground chicken or [place]. Pork with fat trimmed off. Fish and seafood. Egg whites. Dried beans, peas, or lentils. Unsalted nuts, nut butters, and seeds. Unsalted canned beans. Lean cuts of beef with fat trimmed off. Low-sodium, lean deli meat. Dairy Low-fat (1%) or fat-free (skim) milk. Fat-free, low-fat, or reduced-fat cheeses. Nonfat, low-sodium ricotta or cottage cheese. Low-fat or nonfat yogurt. Low-fat, low-sodium cheese. Fats and oils Soft margarine without trans fats. Vegetable oil. Low-fat, reduced-fat, or light mayonnaise and salad dressings (reduced-sodium). Canola, safflower, olive, soybean, and sunflower oils. Avocado. Seasoning and other foods Herbs. Spices. Seasoning mixes without salt. Unsalted popcorn and pretzels. Fat-free sweets. What foods are not recommended? The items listed may not be a complete list. Talk with your dietitian about what dietary choices are best for you. Grains Baked goods made with fat, such as croissants, muffins, or some breads. Dry pasta or rice meal packs. Vegetables Creamed or fried vegetables. Vegetables in a cheese sauce. Regular canned vegetables (not low-sodium or reduced-sodium). Regular canned tomato sauce and paste (not low-sodium or reduced-sodium). Regular tomato and vegetable juice (not low-sodium or reduced-sodium). Rosita FirePickles. Olives. Fruits Canned fruit in a light or heavy syrup. Fried fruit. Fruit in cream or butter sauce. Meat and other protein foods Fatty cuts of meat. Ribs. Fried meat. Tomasa BlaseBacon. Sausage. Bologna and  other processed lunch meats. Salami. Fatback. Hotdogs. Bratwurst. Salted nuts and seeds. Canned beans with added salt. Canned or smoked fish. Whole eggs or egg yolks. Chicken or [place]  with skin. Dairy Whole or 2% milk, cream, and half-and-half. Whole or full-fat cream cheese. Whole-fat or sweetened yogurt. Full-fat cheese. Nondairy creamers. Whipped toppings. Processed cheese and cheese spreads. Fats and oils Butter. Stick margarine. Lard. Shortening. Ghee. Bacon fat. Tropical oils, such as coconut, palm kernel, or palm oil. Seasoning and other foods Salted popcorn and pretzels. Onion salt, garlic salt, seasoned salt, table salt, and sea salt. Worcestershire sauce. Tartar sauce. Barbecue sauce. Teriyaki sauce. Soy sauce, including reduced-sodium. Steak sauce. Canned and packaged gravies. Fish sauce. Oyster sauce. Cocktail sauce. Horseradish that you find on the shelf. Ketchup. Mustard. Meat flavorings and tenderizers. Bouillon cubes. Hot sauce and Tabasco sauce. Premade or packaged marinades. Premade or packaged taco seasonings. Relishes. Regular salad dressings. Where to find more information:  National Heart, Lung, and Blood Institute: PopSteam.is  American Heart Association: www.heart.org Summary  The DASH eating plan is a healthy eating plan that has been shown to reduce high blood pressure (hypertension). It may also reduce your risk for type 2 diabetes, heart disease, and stroke.  With the DASH eating plan, you should limit salt (sodium) intake to 2,300 mg a day. If you have hypertension, you may need to reduce your sodium intake to 1,500 mg a day.  When on the DASH eating plan, aim to eat more fresh fruits and vegetables, whole grains, lean proteins, low-fat dairy, and heart-healthy fats.  Work with your health care provider or diet and nutrition specialist (dietitian) to adjust your eating plan to your individual calorie needs. This information is not intended to replace advice given to you by your health care provider. Make sure you discuss any questions you have with your health care provider. Document Released: 02/20/2011 Document Revised: 02/13/2017  Document Reviewed: 02/25/2016 Elsevier Patient Education  2020 [employer].  Hypertension, Adult High blood pressure (hypertension) is when the force of blood pumping through the arteries is too strong. The arteries are the blood vessels that carry blood from the heart throughout the body. Hypertension forces the heart to work harder to pump blood and may cause arteries to become narrow or stiff. Untreated or uncontrolled hypertension can cause a heart attack, heart failure, a stroke, kidney disease, and other problems. A blood pressure reading consists of a higher number over a lower number. Ideally, your blood pressure should be below 120/80. The first ("top") number is called the systolic pressure. It is a measure of the pressure in your arteries as your heart beats. The second ("bottom") number is called the diastolic pressure. It is a measure of the pressure in your arteries as the heart relaxes. What are the causes? The exact cause of this condition is not known. There are some conditions that result in or are related to high blood pressure. What increases the risk? Some risk factors for high blood pressure are under your control. The following factors may make you more likely to develop this condition:  Smoking.  Having type 2 diabetes mellitus, high cholesterol, or both.  Not getting enough exercise or physical activity.  Being overweight.  Having too much fat, sugar, calories, or salt (sodium) in your diet.  Drinking too much alcohol. Some risk factors for high blood pressure may be difficult or impossible to change. Some of these factors include:  Having chronic kidney disease.  Having a family history  of high blood pressure.  Age. Risk increases with age.  Race. You may be at higher risk if you are African American.  Gender. Men are at higher risk than women before age 10. After age 42, women are at higher risk than men.  Having obstructive sleep apnea.  Stress. What  are the signs or symptoms? High blood pressure may not cause symptoms. Very high blood pressure (hypertensive crisis) may cause:  Headache.  Anxiety.  Shortness of breath.  Nosebleed.  Nausea and vomiting.  Vision changes.  Severe chest pain.  Seizures. How is this diagnosed? This condition is diagnosed by measuring your blood pressure while you are seated, with your arm resting on a flat surface, your legs uncrossed, and your feet flat on the floor. The cuff of the blood pressure monitor will be placed directly against the skin of your upper arm at the level of your heart. It should be measured at least twice using the same arm. Certain conditions can cause a difference in blood pressure between your right and left arms. Certain factors can cause blood pressure readings to be lower or higher than normal for a short period of time:  When your blood pressure is higher when you are in a health care provider's office than when you are at home, this is called white coat hypertension. Most people with this condition do not need medicines.  When your blood pressure is higher at home than when you are in a health care provider's office, this is called masked hypertension. Most people with this condition may need medicines to control blood pressure. If you have a high blood pressure reading during one visit or you have normal blood pressure with other risk factors, you may be asked to:  Return on a different day to have your blood pressure checked again.  Monitor your blood pressure at home for 1 week or longer. If you are diagnosed with hypertension, you may have other blood or imaging tests to help your health care provider understand your overall risk for other conditions. How is this treated? This condition is treated by making healthy lifestyle changes, such as eating healthy foods, exercising more, and reducing your alcohol intake. Your health care provider may prescribe medicine if  lifestyle changes are not enough to get your blood pressure under control, and if:  Your systolic blood pressure is above 130.  Your diastolic blood pressure is above 80. Your personal target blood pressure may vary depending on your medical conditions, your age, and other factors. Follow these instructions at home: Eating and drinking   Eat a diet that is high in fiber and potassium, and low in sodium, added sugar, and fat. An example eating plan is called the DASH (Dietary Approaches to Stop Hypertension) diet. To eat this way: ? Eat plenty of fresh fruits and vegetables. Try to fill one half of your plate at each meal with fruits and vegetables. ? Eat whole grains, such as whole-wheat pasta, brown rice, or whole-grain bread. Fill about one fourth of your plate with whole grains. ? Eat or drink low-fat dairy products, such as skim milk or low-fat yogurt. ? Avoid fatty cuts of meat, processed or cured meats, and poultry with skin. Fill about one fourth of your plate with lean proteins, such as fish, chicken without skin, beans, eggs, or tofu. ? Avoid pre-made and processed foods. These tend to be higher in sodium, added sugar, and fat.  Reduce your daily sodium intake. Most people with  hypertension should eat less than 1,500 mg of sodium a day.  Do not drink alcohol if: ? Your health care provider tells you not to drink. ? You are pregnant, may be pregnant, or are planning to become pregnant.  If you drink alcohol: ? Limit how much you use to:  0-1 drink a day for women.  0-2 drinks a day for men. ? Be aware of how much alcohol is in your drink. In the [place] one drink equals one 12 oz bottle of beer (355 mL), one 5 oz glass of wine (148 mL), or one 1 oz glass of hard liquor (44 mL). Lifestyle   Work with your health care provider to maintain a healthy body weight or to lose weight. Ask what an ideal weight is for you.  Get at least 30 minutes of exercise most days of the week.  Activities may include walking, swimming, or biking.  Include exercise to strengthen your muscles (resistance exercise), such as Pilates or lifting weights, as part of your weekly exercise routine. Try to do these types of exercises for 30 minutes at least 3 days a week.  Do not use any products that contain nicotine or tobacco, such as cigarettes, e-cigarettes, and chewing tobacco. If you need help quitting, ask your health care provider.  Monitor your blood pressure at home as told by your health care provider.  Keep all follow-up visits as told by your health care provider. This is important. Medicines  Take over-the-counter and prescription medicines only as told by your health care provider. Follow directions carefully. Blood pressure medicines must be taken as prescribed.  Do not skip doses of blood pressure medicine. Doing this puts you at risk for problems and can make the medicine less effective.  Ask your health care provider about side effects or reactions to medicines that you should watch for. Contact a health care provider if you:  Think you are having a reaction to a medicine you are taking.  Have headaches that keep coming back (recurring).  Feel dizzy.  Have swelling in your ankles.  Have trouble with your vision. Get help right away if you:  Develop a severe headache or confusion.  Have unusual weakness or numbness.  Feel faint.  Have severe pain in your chest or abdomen.  Vomit repeatedly.  Have trouble breathing. Summary  Hypertension is when the force of blood pumping through your arteries is too strong. If this condition is not controlled, it may put you at risk for serious complications.  Your personal target blood pressure may vary depending on your medical conditions, your age, and other factors. For most people, a normal blood pressure is less than 120/80.  Hypertension is treated with lifestyle changes, medicines, or a combination of both.  Lifestyle changes include losing weight, eating a healthy, low-sodium diet, exercising more, and limiting alcohol. This information is not intended to replace advice given to you by your health care provider. Make sure you discuss any questions you have with your health care provider. Document Released: 03/03/2005 Document Revised: 11/11/2017 Document Reviewed: 11/11/2017 Elsevier Patient Education  2020 Elsevier Inc.  Nonspecific Chest Pain Chest pain can be caused by many different conditions. Some causes of chest pain can be life-threatening. These will require treatment right away. Serious causes of chest pain include:  Heart attack.  A tear in the body's main blood vessel.  Redness and swelling (inflammation) around your heart.  Blood clot in your lungs. Other causes of chest pain may  not be so serious. These include:  Heartburn.  Anxiety or stress.  Damage to bones or muscles in your chest.  Lung infections. Chest pain can feel like:  Pain or discomfort in your chest.  Crushing, pressure, aching, or squeezing pain.  Burning or tingling.  Dull or sharp pain that is worse when you move, cough, or take a deep breath.  Pain or discomfort that is also felt in your back, neck, jaw, shoulder, or arm, or pain that spreads to any of these areas. It is hard to know whether your pain is caused by something that is serious or something that is not so serious. So it is important to see your doctor right away if you have chest pain. Follow these instructions at home: Medicines  Take over-the-counter and prescription medicines only as told by your doctor.  If you were prescribed an antibiotic medicine, take it as told by your doctor. Do not stop taking the antibiotic even if you start to feel better. Lifestyle   Rest as told by your doctor.  Do not use any products that contain nicotine or tobacco, such as cigarettes, e-cigarettes, and chewing tobacco. If you need help  quitting, ask your doctor.  Do not drink alcohol.  Make lifestyle changes as told by your doctor. These may include: ? Getting regular exercise. Ask your doctor what activities are safe for you. ? Eating a heart-healthy diet. A diet and nutrition specialist (dietitian) can help you to learn healthy eating options. ? Staying at a healthy weight. ? Treating diabetes or high blood pressure, if needed. ? Lowering your stress. Activities such as yoga and relaxation techniques can help. General instructions  Pay attention to any changes in your symptoms. Tell your doctor about them or any new symptoms.  Avoid any activities that cause chest pain.  Keep all follow-up visits as told by your doctor. This is important. You may need more testing if your chest pain does not go away. Contact a doctor if:  Your chest pain does not go away.  You feel depressed.  You have a fever. Get help right away if:  Your chest pain is worse.  You have a cough that gets worse, or you cough up blood.  You have very bad (severe) pain in your belly (abdomen).  You pass out (faint).  You have either of these for no clear reason: ? Sudden chest discomfort. ? Sudden discomfort in your arms, back, neck, or jaw.  You have shortness of breath at any time.  You suddenly start to sweat, or your skin gets clammy.  You feel sick to your stomach (nauseous).  You throw up (vomit).  You suddenly feel lightheaded or dizzy.  You feel very weak or tired.  Your heart starts to beat fast, or it feels like it is skipping beats. These symptoms may be an emergency. Do not wait to see if the symptoms will go away. Get medical help right away. Call your local emergency services (911 in the [place] Do not drive yourself to the hospital. Summary  Chest pain can be caused by many different conditions. The cause may be serious and need treatment right away. If you have chest pain, see your doctor right away.  Follow  your doctor's instructions for taking medicines and making lifestyle changes.  Keep all follow-up visits as told by your doctor. This includes visits for any further testing if your chest pain does not go away.  Be sure to know the signs  that show that your condition has become worse. Get help right away if you have these symptoms. This information is not intended to replace advice given to you by your health care provider. Make sure you discuss any questions you have with your health care provider. Document Released: 08/20/2007 Document Revised: 09/03/2017 Document Reviewed: 09/03/2017 [employer] Patient Education  2020 [employer]      Edwina Barth, MD [facility]

## 2019-01-19 NOTE — Patient Instructions (Addendum)
   If you have lab work done today you will be contacted with your lab results within the next 2 weeks.  If you have not heard from us then please contact us. The fastest way to get your results is to register for My Chart.   IF you received an x-ray today, you will receive an invoice from [facility]. Please contact [facility] at [phone] with questions or concerns regarding your invoice.   IF you received labwork today, you will receive an invoice from [facility]. Please contact [facility] at [phone] with questions or concerns regarding your invoice.   Our billing staff will not be able to assist you with questions regarding bills from these companies.  You will be contacted with the lab results as soon as they are available. The fastest way to get your results is to activate your My Chart account. Instructions are located on the last page of this paperwork. If you have not heard from us regarding the results in 2 weeks, please contact this office.     DASH Eating Plan DASH stands for "Dietary Approaches to Stop Hypertension." The DASH eating plan is a healthy eating plan that has been shown to reduce high blood pressure (hypertension). It may also reduce your risk for type 2 diabetes, heart disease, and stroke. The DASH eating plan may also help with weight loss. What are tips for following this plan?  General guidelines  Avoid eating more than 2,300 mg (milligrams) of salt (sodium) a day. If you have hypertension, you may need to reduce your sodium intake to 1,500 mg a day.  Limit alcohol intake to no more than 1 drink a day for nonpregnant women and 2 drinks a day for men. One drink equals 12 oz of beer, 5 oz of wine, or 1 oz of hard liquor.  Work with your health care provider to maintain a healthy body weight or to lose weight. Ask what an ideal weight is for you.  Get at least 30 minutes of exercise that causes your heart to beat faster (aerobic  exercise) most days of the week. Activities may include walking, swimming, or biking.  Work with your health care provider or diet and nutrition specialist (dietitian) to adjust your eating plan to your individual calorie needs. Reading food labels   Check food labels for the amount of sodium per serving. Choose foods with less than 5 percent of the Daily Value of sodium. Generally, foods with less than 300 mg of sodium per serving fit into this eating plan.  To find whole grains, look for the word "whole" as the first word in the ingredient list. Shopping  Buy products labeled as "low-sodium" or "no salt added."  Buy fresh foods. Avoid canned foods and premade or frozen meals. Cooking  Avoid adding salt when cooking. Use salt-free seasonings or herbs instead of table salt or sea salt. Check with your health care provider or pharmacist before using salt substitutes.  Do not fry foods. Cook foods using healthy methods such as baking, boiling, grilling, and broiling instead.  Cook with heart-healthy oils, such as olive, canola, soybean, or sunflower oil. Meal planning  Eat a balanced diet that includes: ? 5 or more servings of fruits and vegetables each day. At each meal, try to fill half of your plate with fruits and vegetables. ? Up to 6-8 servings of whole grains each day. ? Less than 6 oz of lean meat, poultry, or fish each day. A 3-oz serving   of meat is about the same size as a deck of cards. One egg equals 1 oz. ? 2 servings of low-fat dairy each day. ? A serving of nuts, seeds, or beans 5 times each week. ? Heart-healthy fats. Healthy fats called Omega-3 fatty acids are found in foods such as flaxseeds and coldwater fish, like sardines, salmon, and mackerel.  Limit how much you eat of the following: ? Canned or prepackaged foods. ? Food that is high in trans fat, such as fried foods. ? Food that is high in saturated fat, such as fatty meat. ? Sweets, desserts, sugary drinks,  and other foods with added sugar. ? Full-fat dairy products.  Do not salt foods before eating.  Try to eat at least 2 vegetarian meals each week.  Eat more home-cooked food and less restaurant, buffet, and fast food.  When eating at a restaurant, ask that your food be prepared with less salt or no salt, if possible. What foods are recommended? The items listed may not be a complete list. Talk with your dietitian about what dietary choices are best for you. Grains Whole-grain or whole-wheat bread. Whole-grain or whole-wheat pasta. Brown rice. Oatmeal. Quinoa. Bulgur. Whole-grain and low-sodium cereals. Pita bread. Low-fat, low-sodium crackers. Whole-wheat flour tortillas. Vegetables Fresh or frozen vegetables (raw, steamed, roasted, or grilled). Low-sodium or reduced-sodium tomato and vegetable juice. Low-sodium or reduced-sodium tomato sauce and tomato paste. Low-sodium or reduced-sodium canned vegetables. Fruits All fresh, dried, or frozen fruit. Canned fruit in natural juice (without added sugar). Meat and other protein foods Skinless chicken or turkey. Ground chicken or turkey. Pork with fat trimmed off. Fish and seafood. Egg whites. Dried beans, peas, or lentils. Unsalted nuts, nut butters, and seeds. Unsalted canned beans. Lean cuts of beef with fat trimmed off. Low-sodium, lean deli meat. Dairy Low-fat (1%) or fat-free (skim) milk. Fat-free, low-fat, or reduced-fat cheeses. Nonfat, low-sodium ricotta or cottage cheese. Low-fat or nonfat yogurt. Low-fat, low-sodium cheese. Fats and oils Soft margarine without trans fats. Vegetable oil. Low-fat, reduced-fat, or light mayonnaise and salad dressings (reduced-sodium). Canola, safflower, olive, soybean, and sunflower oils. Avocado. Seasoning and other foods Herbs. Spices. Seasoning mixes without salt. Unsalted popcorn and pretzels. Fat-free sweets. What foods are not recommended? The items listed may not be a complete list. Talk with your  dietitian about what dietary choices are best for you. Grains Baked goods made with fat, such as croissants, muffins, or some breads. Dry pasta or rice meal packs. Vegetables Creamed or fried vegetables. Vegetables in a cheese sauce. Regular canned vegetables (not low-sodium or reduced-sodium). Regular canned tomato sauce and paste (not low-sodium or reduced-sodium). Regular tomato and vegetable juice (not low-sodium or reduced-sodium). Pickles. Olives. Fruits Canned fruit in a light or heavy syrup. Fried fruit. Fruit in cream or butter sauce. Meat and other protein foods Fatty cuts of meat. Ribs. Fried meat. Bacon. Sausage. Bologna and other processed lunch meats. Salami. Fatback. Hotdogs. Bratwurst. Salted nuts and seeds. Canned beans with added salt. Canned or smoked fish. Whole eggs or egg yolks. Chicken or turkey with skin. Dairy Whole or 2% milk, cream, and half-and-half. Whole or full-fat cream cheese. Whole-fat or sweetened yogurt. Full-fat cheese. Nondairy creamers. Whipped toppings. Processed cheese and cheese spreads. Fats and oils Butter. Stick margarine. Lard. Shortening. Ghee. Bacon fat. Tropical oils, such as coconut, palm kernel, or palm oil. Seasoning and other foods Salted popcorn and pretzels. Onion salt, garlic salt, seasoned salt, table salt, and sea salt. Worcestershire sauce. Tartar sauce. Barbecue sauce.   Teriyaki sauce. Soy sauce, including reduced-sodium. Steak sauce. Canned and packaged gravies. Fish sauce. Oyster sauce. Cocktail sauce. Horseradish that you find on the shelf. Ketchup. Mustard. Meat flavorings and tenderizers. Bouillon cubes. Hot sauce and Tabasco sauce. Premade or packaged marinades. Premade or packaged taco seasonings. Relishes. Regular salad dressings. Where to find more information:  National Heart, Lung, and Blood Institute: www.nhlbi.nih.gov  American Heart Association: www.heart.org Summary  The DASH eating plan is a healthy eating plan that has  been shown to reduce high blood pressure (hypertension). It may also reduce your risk for type 2 diabetes, heart disease, and stroke.  With the DASH eating plan, you should limit salt (sodium) intake to 2,300 mg a day. If you have hypertension, you may need to reduce your sodium intake to 1,500 mg a day.  When on the DASH eating plan, aim to eat more fresh fruits and vegetables, whole grains, lean proteins, low-fat dairy, and heart-healthy fats.  Work with your health care provider or diet and nutrition specialist (dietitian) to adjust your eating plan to your individual calorie needs. This information is not intended to replace advice given to you by your health care provider. Make sure you discuss any questions you have with your health care provider. Document Released: 02/20/2011 Document Revised: 02/13/2017 Document Reviewed: 02/25/2016 Elsevier Patient Education  2020 [employer]  Hypertension, Adult High blood pressure (hypertension) is when the force of blood pumping through the arteries is too strong. The arteries are the blood vessels that carry blood from the heart throughout the body. Hypertension forces the heart to work harder to pump blood and may cause arteries to become narrow or stiff. Untreated or uncontrolled hypertension can cause a heart attack, heart failure, a stroke, kidney disease, and other problems. A blood pressure reading consists of a higher number over a lower number. Ideally, your blood pressure should be below 120/80. The first ("top") number is called the systolic pressure. It is a measure of the pressure in your arteries as your heart beats. The second ("bottom") number is called the diastolic pressure. It is a measure of the pressure in your arteries as the heart relaxes. What are the causes? The exact cause of this condition is not known. There are some conditions that result in or are related to high blood pressure. What increases the risk? Some risk factors  for high blood pressure are under your control. The following factors may make you more likely to develop this condition:  Smoking.  Having type 2 diabetes mellitus, high cholesterol, or both.  Not getting enough exercise or physical activity.  Being overweight.  Having too much fat, sugar, calories, or salt (sodium) in your diet.  Drinking too much alcohol. Some risk factors for high blood pressure may be difficult or impossible to change. Some of these factors include:  Having chronic kidney disease.  Having a family history of high blood pressure.  Age. Risk increases with age.  Race. You may be at higher risk if you are African American.  Gender. Men are at higher risk than women before age 45. After age 65, women are at higher risk than men.  Having obstructive sleep apnea.  Stress. What are the signs or symptoms? High blood pressure may not cause symptoms. Very high blood pressure (hypertensive crisis) may cause:  Headache.  Anxiety.  Shortness of breath.  Nosebleed.  Nausea and vomiting.  Vision changes.  Severe chest pain.  Seizures. How is this diagnosed? This condition is diagnosed by   measuring your blood pressure while you are seated, with your arm resting on a flat surface, your legs uncrossed, and your feet flat on the floor. The cuff of the blood pressure monitor will be placed directly against the skin of your upper arm at the level of your heart. It should be measured at least twice using the same arm. Certain conditions can cause a difference in blood pressure between your right and left arms. Certain factors can cause blood pressure readings to be lower or higher than normal for a short period of time:  When your blood pressure is higher when you are in a health care provider's office than when you are at home, this is called white coat hypertension. Most people with this condition do not need medicines.  When your blood pressure is higher at home  than when you are in a health care provider's office, this is called masked hypertension. Most people with this condition may need medicines to control blood pressure. If you have a high blood pressure reading during one visit or you have normal blood pressure with other risk factors, you may be asked to:  Return on a different day to have your blood pressure checked again.  Monitor your blood pressure at home for 1 week or longer. If you are diagnosed with hypertension, you may have other blood or imaging tests to help your health care provider understand your overall risk for other conditions. How is this treated? This condition is treated by making healthy lifestyle changes, such as eating healthy foods, exercising more, and reducing your alcohol intake. Your health care provider may prescribe medicine if lifestyle changes are not enough to get your blood pressure under control, and if:  Your systolic blood pressure is above 130.  Your diastolic blood pressure is above 80. Your personal target blood pressure may vary depending on your medical conditions, your age, and other factors. Follow these instructions at home: Eating and drinking   Eat a diet that is high in fiber and potassium, and low in sodium, added sugar, and fat. An example eating plan is called the DASH (Dietary Approaches to Stop Hypertension) diet. To eat this way: ? Eat plenty of fresh fruits and vegetables. Try to fill one half of your plate at each meal with fruits and vegetables. ? Eat whole grains, such as whole-wheat pasta, brown rice, or whole-grain bread. Fill about one fourth of your plate with whole grains. ? Eat or drink low-fat dairy products, such as skim milk or low-fat yogurt. ? Avoid fatty cuts of meat, processed or cured meats, and poultry with skin. Fill about one fourth of your plate with lean proteins, such as fish, chicken without skin, beans, eggs, or tofu. ? Avoid pre-made and processed foods. These  tend to be higher in sodium, added sugar, and fat.  Reduce your daily sodium intake. Most people with hypertension should eat less than 1,500 mg of sodium a day.  Do not drink alcohol if: ? Your health care provider tells you not to drink. ? You are pregnant, may be pregnant, or are planning to become pregnant.  If you drink alcohol: ? Limit how much you use to:  0-1 drink a day for women.  0-2 drinks a day for men. ? Be aware of how much alcohol is in your drink. In the [place] one drink equals one 12 oz bottle of beer (355 mL), one 5 oz glass of wine (148 mL), or one 1 oz glass of   hard liquor (44 mL). Lifestyle   Work with your health care provider to maintain a healthy body weight or to lose weight. Ask what an ideal weight is for you.  Get at least 30 minutes of exercise most days of the week. Activities may include walking, swimming, or biking.  Include exercise to strengthen your muscles (resistance exercise), such as Pilates or lifting weights, as part of your weekly exercise routine. Try to do these types of exercises for 30 minutes at least 3 days a week.  Do not use any products that contain nicotine or tobacco, such as cigarettes, e-cigarettes, and chewing tobacco. If you need help quitting, ask your health care provider.  Monitor your blood pressure at home as told by your health care provider.  Keep all follow-up visits as told by your health care provider. This is important. Medicines  Take over-the-counter and prescription medicines only as told by your health care provider. Follow directions carefully. Blood pressure medicines must be taken as prescribed.  Do not skip doses of blood pressure medicine. Doing this puts you at risk for problems and can make the medicine less effective.  Ask your health care provider about side effects or reactions to medicines that you should watch for. Contact a health care provider if you:  Think you are having a reaction to a  medicine you are taking.  Have headaches that keep coming back (recurring).  Feel dizzy.  Have swelling in your ankles.  Have trouble with your vision. Get help right away if you:  Develop a severe headache or confusion.  Have unusual weakness or numbness.  Feel faint.  Have severe pain in your chest or abdomen.  Vomit repeatedly.  Have trouble breathing. Summary  Hypertension is when the force of blood pumping through your arteries is too strong. If this condition is not controlled, it may put you at risk for serious complications.  Your personal target blood pressure may vary depending on your medical conditions, your age, and other factors. For most people, a normal blood pressure is less than 120/80.  Hypertension is treated with lifestyle changes, medicines, or a combination of both. Lifestyle changes include losing weight, eating a healthy, low-sodium diet, exercising more, and limiting alcohol. This information is not intended to replace advice given to you by your health care provider. Make sure you discuss any questions you have with your health care provider. Document Released: 03/03/2005 Document Revised: 11/11/2017 Document Reviewed: 11/11/2017 Elsevier Patient Education  [employer].  Nonspecific Chest Pain Chest pain can be caused by many different conditions. Some causes of chest pain can be life-threatening. These will require treatment right away. Serious causes of chest pain include:  Heart attack.  A tear in the body's main blood vessel.  Redness and swelling (inflammation) around your heart.  Blood clot in your lungs. Other causes of chest pain may not be so serious. These include:  Heartburn.  Anxiety or stress.  Damage to bones or muscles in your chest.  Lung infections. Chest pain can feel like:  Pain or discomfort in your chest.  Crushing, pressure, aching, or squeezing pain.  Burning or tingling.  Dull or sharp pain that is  worse when you move, cough, or take a deep breath.  Pain or discomfort that is also felt in your back, neck, jaw, shoulder, or arm, or pain that spreads to any of these areas. It is hard to know whether your pain is caused by something that is serious or  something that is not so serious. So it is important to see your doctor right away if you have chest pain. Follow these instructions at home: Medicines  Take over-the-counter and prescription medicines only as told by your doctor.  If you were prescribed an antibiotic medicine, take it as told by your doctor. Do not stop taking the antibiotic even if you start to feel better. Lifestyle   Rest as told by your doctor.  Do not use any products that contain nicotine or tobacco, such as cigarettes, e-cigarettes, and chewing tobacco. If you need help quitting, ask your doctor.  Do not drink alcohol.  Make lifestyle changes as told by your doctor. These may include: ? Getting regular exercise. Ask your doctor what activities are safe for you. ? Eating a heart-healthy diet. A diet and nutrition specialist (dietitian) can help you to learn healthy eating options. ? Staying at a healthy weight. ? Treating diabetes or high blood pressure, if needed. ? Lowering your stress. Activities such as yoga and relaxation techniques can help. General instructions  Pay attention to any changes in your symptoms. Tell your doctor about them or any new symptoms.  Avoid any activities that cause chest pain.  Keep all follow-up visits as told by your doctor. This is important. You may need more testing if your chest pain does not go away. Contact a doctor if:  Your chest pain does not go away.  You feel depressed.  You have a fever. Get help right away if:  Your chest pain is worse.  You have a cough that gets worse, or you cough up blood.  You have very bad (severe) pain in your belly (abdomen).  You pass out (faint).  You have either of these  for no clear reason: ? Sudden chest discomfort. ? Sudden discomfort in your arms, back, neck, or jaw.  You have shortness of breath at any time.  You suddenly start to sweat, or your skin gets clammy.  You feel sick to your stomach (nauseous).  You throw up (vomit).  You suddenly feel lightheaded or dizzy.  You feel very weak or tired.  Your heart starts to beat fast, or it feels like it is skipping beats. These symptoms may be an emergency. Do not wait to see if the symptoms will go away. Get medical help right away. Call your local emergency services (911 in the [place] Do not drive yourself to the hospital. Summary  Chest pain can be caused by many different conditions. The cause may be serious and need treatment right away. If you have chest pain, see your doctor right away.  Follow your doctor's instructions for taking medicines and making lifestyle changes.  Keep all follow-up visits as told by your doctor. This includes visits for any further testing if your chest pain does not go away.  Be sure to know the signs that show that your condition has become worse. Get help right away if you have these symptoms. This information is not intended to replace advice given to you by your health care provider. Make sure you discuss any questions you have with your health care provider. Document Released: 08/20/2007 Document Revised: 09/03/2017 Document Reviewed: 09/03/2017 Elsevier Patient Education  2020 [employer].

## 2019-01-25 ENCOUNTER — Encounter: Payer: Self-pay | Admitting: Cardiology

## 2019-01-25 ENCOUNTER — Other Ambulatory Visit: Payer: Self-pay

## 2019-01-25 ENCOUNTER — Ambulatory Visit: Payer: BC Managed Care – PPO | Admitting: Cardiology

## 2019-01-25 VITALS — BP 127/88 | HR 95 | Temp 97.0°F | Ht 64.0 in | Wt 181.4 lb

## 2019-01-25 DIAGNOSIS — R072 Precordial pain: Secondary | ICD-10-CM | POA: Diagnosis not present

## 2019-01-25 DIAGNOSIS — I10 Essential (primary) hypertension: Secondary | ICD-10-CM | POA: Diagnosis not present

## 2019-01-25 DIAGNOSIS — Z01812 Encounter for preprocedural laboratory examination: Secondary | ICD-10-CM

## 2019-01-25 MED ORDER — METOPROLOL TARTRATE 100 MG PO TABS: ORAL_TABLET | ORAL | 0 refills | Status: DC

## 2019-01-25 NOTE — Progress Notes (Signed)
Cardiology Office Note:    Date:  01/25/2019   ID:  Cathy Cole, DOB 05/25/1970, MRN [id number]  PCP:  Georgina QuintSagardia, Miguel Jose, MD  Cardiologist:  No primary care provider on file.  Electrophysiologist:  None   Referring MD: Georgina QuintSagardia, Miguel Jose, *   Chief Complaint  Patient presents with  . Chest Pain    History of Present Illness:    Cathy Cole is a 48 y.o. female with a hx of hypertension who is referred by Dr. Alvy BimlerSagardia for an initial evaluation of chest pain. Presented to the ED on 01/17/2019 with chest pain. Normal EKG and troponins were negative. Work-up was unremarkable and she was discharged.  Reports that she has chest pain daily.  Reports pain is left-sided, describes as dull aching pain, 3 out of 10 in intensity.  Can last for 20 to 30 minutes but up to several hours.  She states that she does not get regular exercise but does walk up the stairs in her home.  Has not noted chest pain with exertion.  Does note that chest pain seems to occur when her blood pressure is elevated.  Denies any shortness of breath.  Dr. Alvy BimlerSagardia last week started her on losartan-hydrochlorothiazide 50-12.5 mg.  She reports her blood pressure is better controlled since starting this medication, reports BP at home was 131/88 yesterday and later 125/81.  [place]  Was being seen for hypertension and palpitations.  Wore heart monitor and was told no arrhythmia.  Had stress test and was told was normal.  Does not have significant issues with palpitations anymore.  Never smoker.  No family history heart disease in immediate family.  Past Medical History:  Diagnosis Date  . Abnormal uterine bleeding (AUB) 11/07/2014  . Allergy   . Anxiety   . Hypertension   . Menorrhagia 11/07/2014    Past Surgical History:  Procedure Laterality Date  . BUNIONECTOMY    . COSMETIC SURGERY    . NOVASURE ABLATION N/A 11/08/2014   Procedure: NOVASURE ABLATION;   Surgeon: Sherian ReinJody Bovard-Stuckert, MD;  Location: [facility];  Service: Gynecology;  Laterality: N/A;  . TUBAL LIGATION      Current Medications: Current Meds  Medication Sig  . acetaminophen (TYLENOL) 500 MG tablet Take 1,000 mg by mouth every 6 (six) hours as needed for mild pain, moderate pain or headache.  . Cholecalciferol (VITAMIN D3) 2000 UNITS TABS Take 2,000 Units by mouth daily.   Marland Kitchen. LORazepam (ATIVAN) 0.5 MG tablet Take 1 tablet (0.5 mg total) by mouth daily as needed for anxiety.  Marland Kitchen. losartan-hydrochlorothiazide (HYZAAR) 50-12.5 MG tablet Take 1 tablet by mouth daily.  . metoprolol succinate (TOPROL-XL) 25 MG 24 hr tablet Take 1 tablet (25 mg total) by mouth daily.     Allergies:   Bactrim [sulfamethoxazole-trimethoprim]   Social History   Socioeconomic History  . Marital status: Divorced    Spouse name: Not on file  . Number of children: 3  . Years of education: Not on file  . Highest education level: Not on file  Occupational History  . Not on file  Social Needs  . Financial resource strain: Not on file  . Food insecurity    Worry: Not on file    Inability: Not on file  . Transportation needs    Medical: Not on file    Non-medical: Not on file  Tobacco Use  . Smoking status: Never Smoker  . Smokeless  tobacco: Never Used  Substance and Sexual Activity  . Alcohol use: No  . Drug use: Never  . Sexual activity: Yes  Lifestyle  . Physical activity    Days per week: Not on file    Minutes per session: Not on file  . Stress: Not on file  Relationships  . Social Musician on phone: Not on file    Gets together: Not on file    Attends religious service: Not on file    Active member of club or organization: Not on file    Attends meetings of clubs or organizations: Not on file    Relationship status: Not on file  Other Topics Concern  . Not on file  Social History Narrative  . Not on file     Family History: The patient's family history includes  Diabetes in her maternal grandmother and another family member; Hypertension in her maternal grandmother, mother, and another family member.  ROS:   Please see the history of present illness.     All other systems reviewed and are negative.  EKGs/Labs/Other Studies Reviewed:    The following studies were reviewed today:   EKG:  EKG is  ordered today.  The ekg ordered today demonstrates sinus rhythm, rate 88, nonspecific T wave flattening in V3-6, I, III, aVL  Recent Labs: 02/17/2018: ALT 15 01/17/2019: BUN 16; Creatinine, Ser 0.88; Hemoglobin 14.1; Platelets 367; Potassium 3.3; Sodium 137  Recent Lipid Panel    Component Value Date/Time   CHOL 127 02/17/2018 0843   TRIG 124 02/17/2018 0843   HDL 52 02/17/2018 0843   CHOLHDL 2.4 02/17/2018 0843   LDLCALC 50 02/17/2018 0843    Physical Exam:    VS:  BP 127/88   Pulse 95   Temp (!) 97 F (36.1 C)   Ht 5\' 4"  (1.626 m)   Wt 181 lb 6.4 oz (82.3 kg)   SpO2 99%   BMI 31.14 kg/m     Wt Readings from Last 3 Encounters:  01/25/19 181 lb 6.4 oz (82.3 kg)  01/19/19 186 lb (84.4 kg)  01/17/19 180 lb (81.6 kg)     GEN:  Well nourished, well developed in no acute distress HEENT: Normal NECK: No JVD; No carotid bruits LYMPHATICS: No lymphadenopathy CARDIAC: RRR, no murmurs, rubs, gallops RESPIRATORY:  Clear to auscultation without rales, wheezing or rhonchi  ABDOMEN: Soft, non-tender, non-distended MUSCULOSKELETAL:  No edema; No deformity  SKIN: Warm and dry NEUROLOGIC:  Alert and oriented x 3 PSYCHIATRIC:  Normal affect   ASSESSMENT:    1. Pre-procedure lab exam   2. Precordial pain   3. Essential hypertension    PLAN:    In order of problems listed above:  Chest pain: Atypical in description.  Does have CAD risk factors (hypertension).  Overall would classify as low risk for obstructive coronary disease - Coronary CTA - TTE  Hypertension: On losartan-hydrochlorothiazide 50-12.5 mg, Toprol-XL 25 mg.  Appears  controlled  RTC in 3 months   Medication Adjustments/Labs and Tests Ordered: Current medicines are reviewed at length with the patient today.  Concerns regarding medicines are outlined above.  Orders Placed This Encounter  Procedures  . CT CORONARY MORPH W/CTA COR W/SCORE W/CA W/CM &/OR WO/CM  . CT CORONARY FRACTIONAL FLOW RESERVE DATA PREP  . CT CORONARY FRACTIONAL FLOW RESERVE FLUID ANALYSIS  . Basic metabolic panel  . EKG 12-Lead  . ECHOCARDIOGRAM COMPLETE   Meds ordered this encounter  Medications  .  metoprolol tartrate (LOPRESSOR) 100 MG tablet    Sig: TAKE 1 TABLET 2 HR PRIOR TO CARDIAC PROCEDURE    Dispense:  1 tablet    Refill:  0    Patient Instructions  Medication Instructions:  Your Physician recommend you continue on your current medication as directed.    *If you need a refill on your cardiac medications before your next appointment, please call your pharmacy*  Lab Work: Your physician recommends that you return for lab work 1 week prior to procedure (BMP)  If you have labs (blood work) drawn today and your tests are completely normal, you will receive your results only by: Marland Kitchen MyChart Message (if you have MyChart) OR . A paper copy in the mail If you have any lab test that is abnormal or we need to change your treatment, we will call you to review the results.  Testing/Procedures: Your physician has requested that you have an echocardiogram. Echocardiography is a painless test that uses sound waves to create images of your heart. It provides your doctor with information about the size and shape of your heart and how well your heart's chambers and valves are working. This procedure takes approximately one hour. There are no restrictions for this procedure. [address]  Non-Cardiac CT Angiography (CTA), is a special type of CT scan that uses a computer to produce multi-dimensional views of major blood vessels throughout the body. In CT  angiography, a contrast material is injected through an IV to help visualize the blood vessels   Follow-Up: At [facility], you and your health needs are our priority.  As part of our continuing mission to provide you with exceptional heart care, we have created designated Provider Care Teams.  These Care Teams include your primary Cardiologist (physician) and Advanced Practice Providers (APPs -  Physician Assistants and Nurse Practitioners) who all work together to provide you with the care you need, when you need it.  Your next appointment:   3 months  The format for your next appointment:   In Person  Provider:   Oswaldo Milian, MD  Your cardiac CT will be scheduled at one of the below locations:   [facility] [address] [place], [place] [address] [phone]   If scheduled at [facility], please arrive at the [facility] main entrance of [facility] 30-45 minutes prior to test start time. Proceed to the [facility] Radiology Department (first floor) to check-in and test prep.  If scheduled at [facility], please arrive 15 mins early for check-in and test prep.  Please follow these instructions carefully (unless otherwise directed):  On the Night Before the Test: . Be sure to Drink plenty of water. . Do not consume any caffeinated/decaffeinated beverages or chocolate 12 hours prior to your test. . Do not take any antihistamines 12 hours prior to your test.   On the Day of the Test: . Drink plenty of water. Do not drink any water within one hour of the test. . Do not eat any food 4 hours prior to the test. . You may take your regular medications prior to the test.  . Take metoprolol (Lopressor) 100 mg two hours prior to test. . HOLD Metoprolol (Toprol) 25 mg morning of test . HOLD Furosemide/Hydrochlorothiazide morning of the test. . FEMALES- please wear underwire-free bra if available        After  the Test: . Drink plenty of water. Marland Kitchen  After receiving IV contrast, you may experience a mild flushed feeling. This is normal. . On occasion, you may experience a mild rash up to 24 hours after the test. This is not dangerous. If this occurs, you can take Benadryl 25 mg and increase your fluid intake. . If you experience trouble breathing, this can be serious. If it is severe call 911 IMMEDIATELY. If it is mild, please call our office. . If you take any of these medications: Glipizide/Metformin, Avandament, Glucavance, please do not take 48 hours after completing test unless otherwise instructed.   Once we have confirmed authorization from your insurance company, we will call you to set up a date and time for your test.   For non-scheduling related questions, please contact the cardiac imaging nurse navigator should you have any questions/concerns: Rockwell Alexandria, RN Navigator Cardiac Imaging [facility] Heart and Vascular Services [phone] Office        Signed, Little Ishikawa, MD  01/25/2019 10:52 AM    [facility]

## 2019-01-25 NOTE — Patient Instructions (Addendum)
Medication Instructions:  Your Physician recommend you continue on your current medication as directed.    *If you need a refill on your cardiac medications before your next appointment, please call your pharmacy*  Lab Work: Your physician recommends that you return for lab work 1 week prior to procedure (BMP)  If you have labs (blood work) drawn today and your tests are completely normal, you will receive your results only by: Marland Kitchen MyChart Message (if you have MyChart) OR . A paper copy in the mail If you have any lab test that is abnormal or we need to change your treatment, we will call you to review the results.  Testing/Procedures: Your physician has requested that you have an echocardiogram. Echocardiography is a painless test that uses sound waves to create images of your heart. It provides your doctor with information about the size and shape of your heart and how well your heart's chambers and valves are working. This procedure takes approximately one hour. There are no restrictions for this procedure. [address]  Non-Cardiac CT Angiography (CTA), is a special type of CT scan that uses a computer to produce multi-dimensional views of major blood vessels throughout the body. In CT angiography, a contrast material is injected through an IV to help visualize the blood vessels   Follow-Up: At [facility], you and your health needs are our priority.  As part of our continuing mission to provide you with exceptional heart care, we have created designated Provider Care Teams.  These Care Teams include your primary Cardiologist (physician) and Advanced Practice Providers (APPs -  Physician Assistants and Nurse Practitioners) who all work together to provide you with the care you need, when you need it.  Your next appointment:   3 months  The format for your next appointment:   In Person  Provider:   Epifanio Lesches, MD  Your cardiac CT will be scheduled at  one of the below locations:   [facility] [address] [place], [place] [address] [phone]   If scheduled at [facility], please arrive at the [facility] main entrance of [facility] 30-45 minutes prior to test start time. Proceed to the [facility] Radiology Department (first floor) to check-in and test prep.  If scheduled at [facility], please arrive 15 mins early for check-in and test prep.  Please follow these instructions carefully (unless otherwise directed):  On the Night Before the Test: . Be sure to Drink plenty of water. . Do not consume any caffeinated/decaffeinated beverages or chocolate 12 hours prior to your test. . Do not take any antihistamines 12 hours prior to your test.   On the Day of the Test: . Drink plenty of water. Do not drink any water within one hour of the test. . Do not eat any food 4 hours prior to the test. . You may take your regular medications prior to the test.  . Take metoprolol (Lopressor) 100 mg two hours prior to test. . HOLD Metoprolol (Toprol) 25 mg morning of test . HOLD Furosemide/Hydrochlorothiazide morning of the test. . FEMALES- please wear underwire-free bra if available        After the Test: . Drink plenty of water. . After receiving IV contrast, you may experience a mild flushed feeling. This is normal. . On occasion, you may experience a mild rash up to 24 hours after the test. This is not dangerous. If this occurs, you can  take Benadryl 25 mg and increase your fluid intake. . If you experience trouble breathing, this can be serious. If it is severe call 911 IMMEDIATELY. If it is mild, please call our office. . If you take any of these medications: Glipizide/Metformin, Avandament, Glucavance, please do not take 48 hours after completing test unless otherwise instructed.   Once we have confirmed authorization from your insurance company, we will call you to set up a  date and time for your test.   For non-scheduling related questions, please contact the cardiac imaging nurse navigator should you have any questions/concerns: Marchia Bond, RN Navigator Cardiac Imaging [facility] [phone] Office

## 2019-02-03 ENCOUNTER — Ambulatory Visit (HOSPITAL_COMMUNITY): Payer: BC Managed Care – PPO | Attending: Cardiology

## 2019-02-03 ENCOUNTER — Other Ambulatory Visit: Payer: Self-pay

## 2019-02-03 DIAGNOSIS — R072 Precordial pain: Secondary | ICD-10-CM | POA: Insufficient documentation

## 2019-02-04 ENCOUNTER — Telehealth: Payer: Self-pay | Admitting: Cardiology
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
## 2019-02-14 ENCOUNTER — Encounter: Payer: Self-pay | Admitting: Emergency Medicine

## 2019-02-14 ENCOUNTER — Other Ambulatory Visit: Payer: Self-pay

## 2019-02-14 ENCOUNTER — Ambulatory Visit (INDEPENDENT_AMBULATORY_CARE_PROVIDER_SITE_OTHER): Payer: BC Managed Care – PPO | Admitting: Emergency Medicine

## 2019-02-14 ENCOUNTER — Other Ambulatory Visit (HOSPITAL_COMMUNITY)
Admission: RE | Admit: 2019-02-14 | Discharge: 2019-02-14 | Disposition: A | Discharge status: Home / Self Care | Payer: BC Managed Care – PPO | Source: Ambulatory Visit | Attending: Emergency Medicine | Admitting: Emergency Medicine

## 2019-02-14 ENCOUNTER — Ambulatory Visit: Payer: BLUE CROSS/BLUE SHIELD | Admitting: Emergency Medicine

## 2019-02-14 VITALS — BP 120/80 | HR 90 | Temp 98.2°F | Resp 16 | Ht 64.0 in | Wt 179.6 lb

## 2019-02-14 DIAGNOSIS — Z0000 Encounter for general adult medical examination without abnormal findings: Secondary | ICD-10-CM

## 2019-02-14 DIAGNOSIS — N760 Acute vaginitis: Secondary | ICD-10-CM | POA: Diagnosis not present

## 2019-02-14 DIAGNOSIS — Z13220 Encounter for screening for lipoid disorders: Secondary | ICD-10-CM

## 2019-02-14 DIAGNOSIS — B9689 Other specified bacterial agents as the cause of diseases classified elsewhere: Secondary | ICD-10-CM

## 2019-02-14 DIAGNOSIS — E559 Vitamin D deficiency, unspecified: Secondary | ICD-10-CM

## 2019-02-14 DIAGNOSIS — N898 Other specified noninflammatory disorders of vagina: Secondary | ICD-10-CM | POA: Insufficient documentation

## 2019-02-14 DIAGNOSIS — Z13228 Encounter for screening for other metabolic disorders: Secondary | ICD-10-CM

## 2019-02-14 DIAGNOSIS — I10 Essential (primary) hypertension: Secondary | ICD-10-CM | POA: Diagnosis not present

## 2019-02-14 DIAGNOSIS — Z124 Encounter for screening for malignant neoplasm of cervix: Secondary | ICD-10-CM

## 2019-02-14 DIAGNOSIS — Z0001 Encounter for general adult medical examination with abnormal findings: Secondary | ICD-10-CM

## 2019-02-14 DIAGNOSIS — N951 Menopausal and female climacteric states: Secondary | ICD-10-CM | POA: Diagnosis not present

## 2019-02-14 DIAGNOSIS — Z1329 Encounter for screening for other suspected endocrine disorder: Secondary | ICD-10-CM

## 2019-02-14 DIAGNOSIS — Z130 Encounter for screening for diseases of the blood and blood-forming organs and certain disorders involving the immune mechanism: Secondary | ICD-10-CM

## 2019-02-14 DIAGNOSIS — F411 Generalized anxiety disorder: Secondary | ICD-10-CM

## 2019-02-14 LAB — POCT WET + KOH PREP
Trich by wet prep: ABSENT
Yeast by KOH: ABSENT
Yeast by wet prep: ABSENT

## 2019-02-14 LAB — POCT URINALYSIS DIP (MANUAL ENTRY)
Bilirubin, UA: NEGATIVE
Glucose, UA: NEGATIVE mg/dL
Leukocytes, UA: NEGATIVE
Nitrite, UA: NEGATIVE
Protein Ur, POC: NEGATIVE mg/dL
Spec Grav, UA: 1.025 (ref 1.010–1.025)
Urobilinogen, UA: 0.2 E.U./dL
pH, UA: 5.5 (ref 5.0–8.0)

## 2019-02-14 MED ORDER — METRONIDAZOLE 500 MG PO TABS: 500.0000 mg | ORAL_TABLET | Freq: Two times a day (BID) | ORAL | 0 refills | Status: DC

## 2019-02-14 MED ORDER — LORAZEPAM 0.5 MG PO TABS: 0.5000 mg | ORAL_TABLET | Freq: Every day | ORAL | 0 refills | Status: DC | PRN

## 2019-02-14 MED ORDER — METRONIDAZOLE 1.3 % VA GEL: 1.0000 "application " | Freq: Every day | VAGINAL | 3 refills | Status: AC

## 2019-02-14 NOTE — Progress Notes (Signed)
BP Readings from Last 3 Encounters:  01/25/19 127/88  01/19/19 140/82  01/17/19 116/84   Cathy Cole 48 y.o.   No chief complaint on file.   HISTORY OF PRESENT ILLNESS: This is a 48 y.o. female here for annual exam.  Has the following chronic medical problems: 1.  Hypertension: On Hyzaar 50-12.5 mg daily and metoprolol succinate 25 mg daily. 2.  Generalized anxiety disorder: Takes lorazepam as needed. Concerned about the following: Thick foul-smelling vaginal discharge, starting to reach her menopause with irregular menstrual periods, occasional low back pain. Otherwise doing well.  Healthy female with a healthy lifestyle however she works third shift and only sleeps about 5 hours during the day.  Not enough quality sleep.  Good enough nutrition. Refuses flu shot today.  Needs Pap smear.  HPI   Prior to Admission medications   Medication Sig Start Date End Date Taking? Authorizing Provider  acetaminophen (TYLENOL) 500 MG tablet Take 1,000 mg by mouth every 6 (six) hours as needed for mild pain, moderate pain or headache.    [provider]  Cholecalciferol (VITAMIN D3) 2000 UNITS TABS Take 2,000 Units by mouth daily.     [provider]  LORazepam (ATIVAN) 0.5 MG tablet Take 1 tablet (0.5 mg total) by mouth daily as needed for anxiety. 05/10/18   Horald Pollen, MD  losartan-hydrochlorothiazide [facility]) 50-12.5 MG tablet Take 1 tablet by mouth daily. 01/19/19   Horald Pollen, MD  metoprolol succinate (TOPROL-XL) 25 MG 24 hr tablet Take 1 tablet (25 mg total) by mouth daily. 08/10/18   Horald Pollen, MD  metoprolol tartrate (LOPRESSOR) 100 MG tablet TAKE 1 TABLET 2 HR PRIOR TO CARDIAC PROCEDURE 01/25/19   Donato Heinz, MD    Allergies  Allergen Reactions   Bactrim [Sulfamethoxazole-Trimethoprim] Anaphylaxis    Chest pain    Patient Active Problem List   Diagnosis Date Noted   Essential hypertension 08/10/2018    Generalized anxiety disorder 05/10/2018    Past Medical History:  Diagnosis Date   Abnormal uterine bleeding (AUB) 11/07/2014   Allergy    Anxiety    Hypertension    Menorrhagia 11/07/2014    Past Surgical History:  Procedure Laterality Date   BUNIONECTOMY     COSMETIC SURGERY     NOVASURE ABLATION N/A 11/08/2014   Procedure: NOVASURE ABLATION;  Surgeon: Janyth Contes, MD;  Location: [facility];  Service: Gynecology;  Laterality: N/A;   TUBAL LIGATION      Social History   Socioeconomic History   Marital status: Divorced    Spouse name: Not on file   Number of children: 3   Years of education: Not on file   Highest education level: Not on file  Occupational History   Not on file  Social Needs   Financial resource strain: Not on file   Food insecurity    Worry: Not on file    Inability: Not on file   Transportation needs    Medical: Not on file    Non-medical: Not on file  Tobacco Use   Smoking status: Never Smoker   Smokeless tobacco: Never Used  Substance and Sexual Activity   Alcohol use: No   Drug use: Never   Sexual activity: Yes  Lifestyle   Physical activity    Days per week: Not on file    Minutes per session: Not on file   Stress: Not on file  Relationships   Social connections    Talks on  phone: Not on file    Gets together: Not on file    Attends religious service: Not on file    Active member of club or organization: Not on file    Attends meetings of clubs or organizations: Not on file    Relationship status: Not on file   Intimate partner violence    Fear of current or ex partner: Not on file    Emotionally abused: Not on file    Physically abused: Not on file    Forced sexual activity: Not on file  Other Topics Concern   Not on file  Social History Narrative   Not on file    Family History  Problem Relation Age of Onset   Diabetes Other    Hypertension Other    Hypertension Mother    Diabetes  Maternal Grandmother    Hypertension Maternal Grandmother      Review of Systems  Constitutional: Negative.  Negative for chills and fever.  HENT: Negative.  Negative for congestion and sore throat.   Eyes: Negative.   Respiratory: Negative.  Negative for cough and shortness of breath.   Cardiovascular: Negative.  Negative for chest pain and palpitations.  Gastrointestinal: Negative.  Negative for abdominal pain, blood in stool, diarrhea, nausea and vomiting.  Genitourinary: Negative.  Negative for dysuria, flank pain and hematuria.       Irregular menstrual periods. Vaginal discharge.  History of bacterial vaginosis.  Musculoskeletal: Positive for back pain (Occasional).  Skin: Negative.   Neurological: Negative.  Negative for dizziness and headaches.  Endo/Heme/Allergies: Negative.   All other systems reviewed and are negative.  Vitals:   02/14/19 0818  BP: 120/80  Pulse: 90  Resp: 16  Temp: 98.2 F (36.8 C)  SpO2: 98%     Physical Exam Vitals signs reviewed.  Constitutional:      Appearance: Normal appearance.  HENT:     Head: Normocephalic.  Eyes:     Extraocular Movements: Extraocular movements intact.     Conjunctiva/sclera: Conjunctivae normal.     Pupils: Pupils are equal, round, and reactive to light.  Neck:     Musculoskeletal: Normal range of motion and neck supple.  Cardiovascular:     Rate and Rhythm: Normal rate and regular rhythm.     Pulses: Normal pulses.     Heart sounds: Normal heart sounds.  Abdominal:     General: Bowel sounds are normal. There is no distension.     Palpations: Abdomen is soft.     Tenderness: There is no abdominal tenderness.  Musculoskeletal: Normal range of motion.  Skin:    General: Skin is warm and dry.     Capillary Refill: Capillary refill takes less than 2 seconds.  Neurological:     General: No focal deficit present.     Mental Status: She is alert and oriented to person, place, and time.  Psychiatric:         Mood and Affect: Mood normal.        Behavior: Behavior normal.    Results for orders placed or performed in visit on 02/14/19 (from the past 24 hour(s))  POCT urinalysis dipstick     Status: Abnormal   Collection Time: 02/14/19  8:59 AM  Result Value Ref Range   Color, UA yellow yellow   Clarity, UA clear clear   Glucose, UA negative negative mg/dL   Bilirubin, UA negative negative   Ketones, POC UA trace (5) (A) negative mg/dL   Spec Grav,  UA 1.025 1.010 - 1.025   Blood, UA moderate (A) negative   pH, UA 5.5 5.0 - 8.0   Protein Ur, POC negative negative mg/dL   Urobilinogen, UA 0.2 0.2 or 1.0 E.U./dL   Nitrite, UA Negative Negative   Leukocytes, UA Negative Negative  POCT Wet + KOH Prep ([facility])     Status: Abnormal   Collection Time: 02/14/19  8:59 AM  Result Value Ref Range   Yeast by KOH Absent Absent   Yeast by wet prep Absent Absent   WBC by wet prep Moderate (A) Few   Clue Cells Wet Prep HPF POC Many (A) None   Trich by wet prep Absent Absent   Bacteria Wet Prep HPF POC Many (A) Few   Epithelial Cells By [employer] Pref ([facility]) Moderate (A) None, Few, Too numerous to count   RBC,UR,HPF,POC None None RBC/hpf     ASSESSMENT & PLAN: Vallarie was seen today for annual exam and medication refill.  Diagnoses and all orders for this visit:  Routine general medical examination at a health care facility  Screening for cervical cancer  Essential hypertension -     Comprehensive metabolic panel  Generalized anxiety disorder -     LORazepam (ATIVAN) 0.5 MG tablet; Take 1 tablet (0.5 mg total) by mouth daily as needed for anxiety.  Screening for deficiency anemia  Screening for lipoid disorders -     Lipid panel  Screening for endocrine, metabolic and immunity disorder -     Comprehensive metabolic panel  Perimenopause -     FSH/LH  Vitamin D deficiency -     Vitamin D 25 (osteoporosis screening)  Vaginal discharge -     Cancel: WET PREP FOR TRICH, YEAST, CLUE -      POCT urinalysis dipstick -     GC/Chlamydia probe amp ([place])not at [facility] -     POCT Wet + KOH Prep ([facility])  Cervical cancer screening -     Cytology - PAP([place])  Bacterial vaginosis -     metroNIDAZOLE (FLAGYL) 500 MG tablet; Take 1 tablet (500 mg total) by mouth 2 (two) times daily for 7 days.    Patient Instructions       If you have lab work done today you will be contacted with your lab results within the next 2 weeks.  If you have not heard from [place] then please contact us. The fastest way to get your results is to register for My Chart.   IF you received an x-ray today, you will receive an invoice from [facility]. Please contact [facility] at [phone] with questions or concerns regarding your invoice.   IF you received labwork today, you will receive an invoice from [place]. Please contact [facility] at [phone] with questions or concerns regarding your invoice.   Our billing staff will not be able to assist you with questions regarding bills from these companies.  You will be contacted with the lab results as soon as they are available. The fastest way to get your results is to activate your My Chart account. Instructions are located on the last page of this paperwork. If you have not heard from [place] regarding the results in 2 weeks, please contact this office.    We recommend that you schedule a mammogram for breast cancer screening. Typically, you do not need a referral to do this. Please contact a local imaging center to schedule your mammogram.  [facility] - [phone]  *ask  for the Radiology Department [facility]) - [phone] or [phone]  [facility] - [phone] [facility] - [phone] [facility] - [phone]  *ask for the Radiology Department [facility] - [phone]  *ask for the Radiology Department [facility] - [phone]  *ask for the Mammography Department [facility] - [phone]  Health Maintenance, Female Adopting a healthy lifestyle and getting preventive care are important in promoting health and wellness. Ask your health care provider about:  The right schedule for you to have regular tests and exams.  Things you can do on your own to prevent diseases and keep yourself healthy. What should I know about diet, weight, and exercise? Eat a healthy diet   Eat a diet that includes plenty of vegetables, fruits, low-fat dairy products, and lean protein.  Do not eat a lot of foods that are high in solid fats, added sugars, or sodium. Maintain a healthy weight Body mass index (BMI) is used to identify weight problems. It estimates body fat based on height and weight. Your health care provider can help determine your BMI and help you achieve or maintain a healthy weight. Get regular exercise Get regular exercise. This is one of the most important things you can do for your health. Most adults should:  Exercise for at least 150 minutes each week. The exercise should increase your heart rate and make you sweat (moderate-intensity exercise).  Do strengthening exercises at least twice a week. This is in addition to the moderate-intensity exercise.  Spend less time sitting. Even light physical activity can be beneficial. Watch cholesterol and blood lipids Have your blood tested for lipids and cholesterol at 47 years of age, then have this test every 5 years. Have your cholesterol levels checked more often if:  Your lipid or cholesterol levels are high.  You are older than 47 years of age.  You are at high risk for heart disease. What should I know about cancer screening? Depending on your health history and family history, you may need to have cancer screening at various ages. This may include screening for:  Breast cancer.  Cervical cancer.  Colorectal  cancer.  Skin cancer.  Lung cancer. What should I know about heart disease, diabetes, and high blood pressure? Blood pressure and heart disease  High blood pressure causes heart disease and increases the risk of stroke. This is more likely to develop in people who have high blood pressure readings, are of African descent, or are overweight.  Have your blood pressure checked: ? Every 3-5 years if you are 75-18 years of age. ? Every year if you are 58 years old or older. Diabetes Have regular diabetes screenings. This checks your fasting blood sugar level. Have the screening done:  Once every three years after age 84 if you are at a normal weight and have a low risk for diabetes.  More often and at a younger age if you are overweight or have a high risk for diabetes. What should I know about preventing infection? Hepatitis B If you have a higher risk for hepatitis B, you should be screened for this virus. Talk with your health care provider to find out if you are at risk for hepatitis B infection. Hepatitis C Testing is recommended for:  Everyone born from [birth year] through 1964.  Anyone with known risk factors for hepatitis C. Sexually transmitted infections (STIs)  Get screened for STIs, including  gonorrhea and chlamydia, if: ? You are sexually active and are younger than 47 years of age. ? You are older than 47 years of age and your health care provider tells you that you are at risk for this type of infection. ? Your sexual activity has changed since you were last screened, and you are at increased risk for chlamydia or gonorrhea. Ask your health care provider if you are at risk.  Ask your health care provider about whether you are at high risk for HIV. Your health care provider may recommend a prescription medicine to help prevent HIV infection. If you choose to take medicine to prevent HIV, you should first get tested for HIV. You should then be tested every 3 months for as long as  you are taking the medicine. Pregnancy  If you are about to stop having your period (premenopausal) and you may become pregnant, seek counseling before you get pregnant.  Take 400 to 800 micrograms (mcg) of folic acid every day if you become pregnant.  Ask for birth control (contraception) if you want to prevent pregnancy. Osteoporosis and menopause Osteoporosis is a disease in which the bones lose minerals and strength with aging. This can result in bone fractures. If you are 53 years old or older, or if you are at risk for osteoporosis and fractures, ask your health care provider if you should:  Be screened for bone loss.  Take a calcium or vitamin D supplement to lower your risk of fractures.  Be given hormone replacement therapy (HRT) to treat symptoms of menopause. Follow these instructions at home: Lifestyle  Do not use any products that contain nicotine or tobacco, such as cigarettes, e-cigarettes, and chewing tobacco. If you need help quitting, ask your health care provider.  Do not use street drugs.  Do not share needles.  Ask your health care provider for help if you need support or information about quitting drugs. Alcohol use  Do not drink alcohol if: ? Your health care provider tells you not to drink. ? You are pregnant, may be pregnant, or are planning to become pregnant.  If you drink alcohol: ? Limit how much you use to 0-1 drink a day. ? Limit intake if you are breastfeeding.  Be aware of how much alcohol is in your drink. In the [place] one drink equals one 12 oz bottle of beer (355 mL), one 5 oz glass of wine (148 mL), or one 1 oz glass of hard liquor (44 mL). General instructions  Schedule regular health, dental, and eye exams.  Stay current with your vaccines.  Tell your health care provider if: ? You often feel depressed. ? You have ever been abused or do not feel safe at home. Summary  Adopting a healthy lifestyle and getting preventive care are  important in promoting health and wellness.  Follow your health care provider's instructions about healthy diet, exercising, and getting tested or screened for diseases.  Follow your health care provider's instructions on monitoring your cholesterol and blood pressure. This information is not intended to replace advice given to you by your health care provider. Make sure you discuss any questions you have with your health care provider. Document Released: 09/16/2010 Document Revised: 02/24/2018 Document Reviewed: 02/24/2018 Elsevier Patient Education  2020 Elsevier Inc.  Bacterial Vaginosis  Bacterial vaginosis is an infection of the vagina. It happens when too many normal germs (healthy bacteria) grow in the vagina. This infection puts you at risk for infections from sex (STIs).  Treating this infection can lower your risk for some STIs. You should also treat this if you are pregnant. It can cause your baby to be born early. Follow these instructions at home: Medicines  Take over-the-counter and prescription medicines only as told by your doctor.  Take or use your antibiotic medicine as told by your doctor. Do not stop taking or using it even if you start to feel better. General instructions  If you your sexual partner is a woman, tell her that you have this infection. She needs to get treatment if she has symptoms. If you have a female partner, he does not need to be treated.  During treatment: ? Avoid sex. ? Do not douche. ? Avoid alcohol as told. ? Avoid breastfeeding as told.  Drink enough fluid to keep your pee (urine) clear or pale yellow.  Keep your vagina and butt (rectum) clean. ? Wash the area with warm water every day. ? Wipe from front to back after you use the toilet.  Keep all follow-up visits as told by your doctor. This is important. Preventing this condition  Do not douche.  Use only warm water to wash around your vagina.  Use protection when you have sex.  This includes: ? Latex condoms. ? Dental dams.  Limit how many people you have sex with. It is best to only have sex with the same person (be monogamous).  Get tested for STIs. Have your partner get tested.  Wear underwear that is cotton or lined with cotton.  Avoid tight pants and pantyhose. This is most important in summer.  Do not use any products that have nicotine or tobacco in them. These include cigarettes and e-cigarettes. If you need help quitting, ask your doctor.  Do not use illegal drugs.  Limit how much alcohol you drink. Contact a doctor if:  Your symptoms do not get better, even after you are treated.  You have more discharge or pain when you pee (urinate).  You have a fever.  You have pain in your belly (abdomen).  You have pain with sex.  Your bleed from your vagina between periods. Summary  This infection happens when too many germs (bacteria) grow in the vagina.  Treating this condition can lower your risk for some infections from sex (STIs).  You should also treat this if you are pregnant. It can cause early (premature) birth.  Do not stop taking or using your antibiotic medicine even if you start to feel better. This information is not intended to replace advice given to you by your health care provider. Make sure you discuss any questions you have with your health care provider. Document Released: 12/11/2007 Document Revised: 02/13/2017 Document Reviewed: 11/17/2015 Elsevier Patient Education  2020 Elsevier Inc.      Edwina Barth, MD [facility]

## 2019-02-14 NOTE — Patient Instructions (Addendum)
If you have lab work done today you will be contacted with your lab results within the next 2 weeks.  If you have not heard from [place] then please contact us. The fastest way to get your results is to register for My Chart.   IF you received an x-ray today, you will receive an invoice from [facility]. Please contact [facility] at [phone] with questions or concerns regarding your invoice.   IF you received labwork today, you will receive an invoice from [facility]. Please contact [facility] at [phone] with questions or concerns regarding your invoice.   Our billing staff will not be able to assist you with questions regarding bills from these companies.  You will be contacted with the lab results as soon as they are available. The fastest way to get your results is to activate your My Chart account. Instructions are located on the last page of this paperwork. If you have not heard from [place] regarding the results in 2 weeks, please contact this office.    We recommend that you schedule a mammogram for breast cancer screening. Typically, you do not need a referral to do this. Please contact a local imaging center to schedule your mammogram.  [facility] - [phone]  *ask for the Radiology Department [facility] - [phone] or [phone]  [facility] - [phone] [place] [phone] [facility] - [phone]  *ask for the [facility] - [phone]  *ask for the Radiology Department [facility] - [phone]  *ask for the [facility] - [phone]  Health Maintenance, Female Adopting a healthy lifestyle and getting preventive care are important in promoting health and wellness. Ask your health care provider about:  The right schedule for you to have regular tests and exams.  Things you  can do on your own to prevent diseases and keep yourself healthy. What should I know about diet, weight, and exercise? Eat a healthy diet   Eat a diet that includes plenty of vegetables, fruits, low-fat dairy products, and lean protein.  Do not eat a lot of foods that are high in solid fats, added sugars, or sodium. Maintain a healthy weight Body mass index (BMI) is used to identify weight problems. It estimates body fat based on height and weight. Your health care provider can help determine your BMI and help you achieve or maintain a healthy weight. Get regular exercise Get regular exercise. This is one of the most important things you can do for your health. Most adults should:  Exercise for at least 150 minutes each week. The exercise should increase your heart rate and make you sweat (moderate-intensity exercise).  Do strengthening exercises at least twice a week. This is in addition to the moderate-intensity exercise.  Spend less time sitting. Even light physical activity can be beneficial. Watch cholesterol and blood lipids Have your blood tested for lipids and cholesterol at 47 years of age, then have this test every 5 years. Have your cholesterol levels checked more often if:  Your lipid or cholesterol levels are high.  You are older than 47 years of age.  You are at high risk for heart disease. What should I know about cancer screening? Depending on your health history and family history, you may need to have cancer screening at various ages. This may include screening for:  Breast cancer.  Cervical cancer.  Colorectal cancer.  Skin cancer.  Lung cancer. What should I know about heart disease, diabetes, and high blood pressure? Blood pressure and heart disease  High blood pressure causes heart disease and increases the risk of stroke. This is more likely to develop in people who have high blood pressure readings, are of African descent, or are overweight.  Have  your blood pressure checked: ? Every 3-5 years if you are 47-85 years of age. ? Every year if you are 47 years old or older. Diabetes Have regular diabetes screenings. This checks your fasting blood sugar level. Have the screening done:  Once every three years after age 19 if you are at a normal weight and have a low risk for diabetes.  More often and at a younger age if you are overweight or have a high risk for diabetes. What should I know about preventing infection? Hepatitis B If you have a higher risk for hepatitis B, you should be screened for this virus. Talk with your health care provider to find out if you are at risk for hepatitis B infection. Hepatitis C Testing is recommended for:  Everyone born from [birth year] through 1964.  Anyone with known risk factors for hepatitis C. Sexually transmitted infections (STIs)  Get screened for STIs, including gonorrhea and chlamydia, if: ? You are sexually active and are younger than 47 years of age. ? You are older than 47 years of age and your health care provider tells you that you are at risk for this type of infection. ? Your sexual activity has changed since you were last screened, and you are at increased risk for chlamydia or gonorrhea. Ask your health care provider if you are at risk.  Ask your health care provider about whether you are at high risk for HIV. Your health care provider may recommend a prescription medicine to help prevent HIV infection. If you choose to take medicine to prevent HIV, you should first get tested for HIV. You should then be tested every 3 months for as long as you are taking the medicine. Pregnancy  If you are about to stop having your period (premenopausal) and you may become pregnant, seek counseling before you get pregnant.  Take 400 to 800 micrograms (mcg) of folic acid every day if you become pregnant.  Ask for birth control (contraception) if you want to prevent pregnancy. Osteoporosis and  menopause Osteoporosis is a disease in which the bones lose minerals and strength with aging. This can result in bone fractures. If you are 47 years old or older, or if you are at risk for osteoporosis and fractures, ask your health care provider if you should:  Be screened for bone loss.  Take a calcium or vitamin D supplement to lower your risk of fractures.  Be given hormone replacement therapy (HRT) to treat symptoms of menopause. Follow these instructions at home: Lifestyle  Do not use any products that contain nicotine or tobacco, such as cigarettes, e-cigarettes, and chewing tobacco. If you need help quitting, ask your health care provider.  Do not use street drugs.  Do not share needles.  Ask your health care provider for help if you need support or information about quitting drugs. Alcohol use  Do not drink alcohol if: ? Your health care provider tells you not to drink. ? You are pregnant, may be pregnant, or are planning to become pregnant.  If you drink alcohol: ? Limit how much you use to 0-1 drink a day. ? Limit  intake if you are breastfeeding.  Be aware of how much alcohol is in your drink. In the [place] one drink equals one 12 oz bottle of beer (355 mL), one 5 oz glass of wine (148 mL), or one 1 oz glass of hard liquor (44 mL). General instructions  Schedule regular health, dental, and eye exams.  Stay current with your vaccines.  Tell your health care provider if: ? You often feel depressed. ? You have ever been abused or do not feel safe at home. Summary  Adopting a healthy lifestyle and getting preventive care are important in promoting health and wellness.  Follow your health care provider's instructions about healthy diet, exercising, and getting tested or screened for diseases.  Follow your health care provider's instructions on monitoring your cholesterol and blood pressure. This information is not intended to replace advice given to you by your  health care provider. Make sure you discuss any questions you have with your health care provider. Document Released: 09/16/2010 Document Revised: 02/24/2018 Document Reviewed: 02/24/2018 Elsevier Patient Education  [place].  Bacterial Vaginosis  Bacterial vaginosis is an infection of the vagina. It happens when too many normal germs (healthy bacteria) grow in the vagina. This infection puts you at risk for infections from sex (STIs). Treating this infection can lower your risk for some STIs. You should also treat this if you are pregnant. It can cause your baby to be born early. Follow these instructions at home: Medicines  Take over-the-counter and prescription medicines only as told by your doctor.  Take or use your antibiotic medicine as told by your doctor. Do not stop taking or using it even if you start to feel better. General instructions  If you your sexual partner is a woman, tell her that you have this infection. She needs to get treatment if she has symptoms. If you have a female partner, he does not need to be treated.  During treatment: ? Avoid sex. ? Do not douche. ? Avoid alcohol as told. ? Avoid breastfeeding as told.  Drink enough fluid to keep your pee (urine) clear or pale yellow.  Keep your vagina and butt (rectum) clean. ? Wash the area with warm water every day. ? Wipe from front to back after you use the toilet.  Keep all follow-up visits as told by your doctor. This is important. Preventing this condition  Do not douche.  Use only warm water to wash around your vagina.  Use protection when you have sex. This includes: ? Latex condoms. ? Dental dams.  Limit how many people you have sex with. It is best to only have sex with the same person (be monogamous).  Get tested for STIs. Have your partner get tested.  Wear underwear that is cotton or lined with cotton.  Avoid tight pants and pantyhose. This is most important in summer.  Do not  use any products that have nicotine or tobacco in them. These include cigarettes and e-cigarettes. If you need help quitting, ask your doctor.  Do not use illegal drugs.  Limit how much alcohol you drink. Contact a doctor if:  Your symptoms do not get better, even after you are treated.  You have more discharge or pain when you pee (urinate).  You have a fever.  You have pain in your belly (abdomen).  You have pain with sex.  Your bleed from your vagina between periods. Summary  This infection happens when too many germs (bacteria) grow in the vagina.  Treating this condition can lower your risk for some infections from sex (STIs).  You should also treat this if you are pregnant. It can cause early (premature) birth.  Do not stop taking or using your antibiotic medicine even if you start to feel better. This information is not intended to replace advice given to you by your health care provider. Make sure you discuss any questions you have with your health care provider. Document Released: 12/11/2007 Document Revised: 02/13/2017 Document Reviewed: 11/17/2015 Elsevier Patient Education  2020 [employer].

## 2019-02-14 NOTE — Addendum Note (Signed)
Addended by: Davina Poke on: 02/14/2019 09:35 AM   Modules accepted: Orders

## 2019-02-15 LAB — FSH/LH
FSH: 22.3 m[IU]/mL
LH: 45.9 m[IU]/mL

## 2019-02-15 LAB — COMPREHENSIVE METABOLIC PANEL
ALT: 12 IU/L (ref 0–32)
AST: 15 IU/L (ref 0–40)
Albumin/Globulin Ratio: 1.4 (ref 1.2–2.2)
Albumin: 4.6 g/dL (ref 3.8–4.8)
Alkaline Phosphatase: 87 IU/L (ref 39–117)
BUN/Creatinine Ratio: 22 (ref 9–23)
BUN: 17 mg/dL (ref 6–24)
Bilirubin Total: 0.6 mg/dL (ref 0.0–1.2)
CO2: 21 mmol/L (ref 20–29)
Calcium: 10 mg/dL (ref 8.7–10.2)
Chloride: 101 mmol/L (ref 96–106)
Creatinine, Ser: 0.78 mg/dL (ref 0.57–1.00)
GFR calc Af Amer: 105 mL/min/{1.73_m2} (ref >59)
GFR calc non Af Amer: 91 mL/min/{1.73_m2} (ref >59)
Globulin, Total: 3.3 g/dL (ref 1.5–4.5)
Glucose: 84 mg/dL (ref 65–99)
Potassium: 3.8 mmol/L (ref 3.5–5.2)
Sodium: 142 mmol/L (ref 134–144)
Total Protein: 7.9 g/dL (ref 6.0–8.5)

## 2019-02-15 LAB — LIPID PANEL
Chol/HDL Ratio: 2.5 ratio (ref 0.0–4.4)
Cholesterol, Total: 151 mg/dL (ref 100–199)
HDL: 61 mg/dL (ref >39)
LDL Chol Calc (NIH): 65 mg/dL (ref 0–99)
Triglycerides: 147 mg/dL (ref 0–149)
VLDL Cholesterol Cal: 25 mg/dL (ref 5–40)

## 2019-02-15 LAB — VITAMIN D 25 HYDROXY (VIT D DEFICIENCY, FRACTURES): Vit D, 25-Hydroxy: 22.9 ng/mL — ABNORMAL LOW (ref 30.0–100.0)

## 2019-02-15 LAB — GC/CHLAMYDIA PROBE AMP (~~LOC~~) NOT AT ARMC
Chlamydia: NEGATIVE
Comment: NEGATIVE
Comment: NORMAL
Neisseria Gonorrhea: NEGATIVE

## 2019-02-16 ENCOUNTER — Telehealth: Payer: Self-pay | Admitting: Emergency Medicine

## 2019-02-16 LAB — CYTOLOGY - PAP: Diagnosis: NEGATIVE

## 2019-02-16 NOTE — Telephone Encounter (Signed)
Pt called back and is requesting to have this medication sent in Please advise.

## 2019-02-16 NOTE — Telephone Encounter (Signed)
Copied from [place] [phone]. Topic: General - Other >> Feb 16, 2019 12:12 PM Sheran Luz wrote: Patient requesting call back from [place] to discuss recent labs.

## 2019-02-16 NOTE — Telephone Encounter (Signed)
metroNIDAZOLE 1.3 % GEL    Patient requesting call back from office regarding this medication. She states her insurance will not cover.

## 2019-02-17 ENCOUNTER — Telehealth: Payer: Self-pay | Admitting: Emergency Medicine

## 2019-02-17 NOTE — Telephone Encounter (Signed)
Pt would like to see if another gel could be prescribed due to her insurance not covering this prescription   Pharmacy preferred is  [id number] - [place], [place].

## 2019-02-18 NOTE — Telephone Encounter (Signed)
Pt needs this script for the weekend

## 2019-02-18 NOTE — Telephone Encounter (Signed)
Pt first put this request in on 02/16/19. She needs this gel. Please advise at [phone]

## 2019-02-24 NOTE — Telephone Encounter (Signed)
Call patient please.  Vitamin D level is low.  Should supplement with daily vitamin D 1000 units.  Rest of her labs look pretty good.  Normal CBC and normal CMP.  Normal lipid profile.  Normal Pap smear.  Positive bacterial vaginosis.  Thanks.

## 2019-02-24 NOTE — Telephone Encounter (Signed)
Please advise on pts lab results from 02/14/2019 as pt is requesting the results. Marland Kitchenpk/CMA

## 2019-02-25 NOTE — Telephone Encounter (Signed)
I spoke to the pt regarding her lab results and she stated that the medication that you prescribed for her BV was denied. I advised the pt to call her insurance company to see what medication they will cover that is close to the medication that you prescribed bc we do not know what the insurance company will and will not cover. Pk/CMA

## 2019-02-25 NOTE — Telephone Encounter (Signed)
Pt has been notified on 02/25/2019 to contact her insurance company to see what medication they will cover. Pk/CMA

## 2019-02-25 NOTE — Telephone Encounter (Signed)
Pt insurance will cover metronidazole gel.

## 2019-02-25 NOTE — Telephone Encounter (Signed)
Medication sent in to pharmacy but not covered and pt will contact her insurance company to see what they will cover and call us back with meds they will cover.

## 2019-02-25 NOTE — Telephone Encounter (Signed)
Pt called back to let you know that her insurance will cover metronidazole gel

## 2019-02-27 ENCOUNTER — Other Ambulatory Visit: Payer: Self-pay | Admitting: Emergency Medicine

## 2019-02-27 DIAGNOSIS — B9689 Other specified bacterial agents as the cause of diseases classified elsewhere: Secondary | ICD-10-CM

## 2019-02-27 DIAGNOSIS — N760 Acute vaginitis: Secondary | ICD-10-CM

## 2019-02-27 MED ORDER — METRONIDAZOLE 0.75 % VA GEL: 1.0000 | Freq: Two times a day (BID) | VAGINAL | Status: AC

## 2019-02-27 NOTE — Telephone Encounter (Signed)
Thanks

## 2019-02-27 NOTE — Telephone Encounter (Signed)
Prescription sent. Thanks

## 2019-02-28 ENCOUNTER — Other Ambulatory Visit: Payer: Self-pay | Admitting: Emergency Medicine

## 2019-02-28 DIAGNOSIS — B9689 Other specified bacterial agents as the cause of diseases classified elsewhere: Secondary | ICD-10-CM

## 2019-02-28 DIAGNOSIS — N760 Acute vaginitis: Secondary | ICD-10-CM

## 2019-02-28 MED ORDER — METRONIDAZOLE 0.75 % EX GEL: 1.0000 "application " | Freq: Two times a day (BID) | CUTANEOUS | 0 refills | Status: DC

## 2019-02-28 NOTE — Telephone Encounter (Signed)
Rx was sent in the computer wrong as in office admin. Corrected and sent to pharmacy per provider.

## 2019-03-03 DIAGNOSIS — Z01419 Encounter for gynecological examination (general) (routine) without abnormal findings: Secondary | ICD-10-CM | POA: Diagnosis not present

## 2019-03-03 DIAGNOSIS — Z1231 Encounter for screening mammogram for malignant neoplasm of breast: Secondary | ICD-10-CM | POA: Diagnosis not present

## 2019-03-03 DIAGNOSIS — Z6831 Body mass index (BMI) 31.0-31.9, adult: Secondary | ICD-10-CM | POA: Diagnosis not present

## 2019-03-03 DIAGNOSIS — Z1389 Encounter for screening for other disorder: Secondary | ICD-10-CM | POA: Diagnosis not present

## 2019-03-03 DIAGNOSIS — Z130 Encounter for screening for diseases of the blood and blood-forming organs and certain disorders involving the immune mechanism: Secondary | ICD-10-CM | POA: Diagnosis not present

## 2019-03-04 ENCOUNTER — Telehealth: Payer: Self-pay | Admitting: Emergency Medicine

## 2019-03-04 NOTE — Telephone Encounter (Signed)
Pt would like copy of latest PAP  (02/14/2019)smear results sent to [facility]  Fax # [phone]

## 2019-03-04 NOTE — Telephone Encounter (Signed)
I spoke with pt and got a verbal auth to release her latest pap smear to [facility]. I faxed and got a confirmation.

## 2019-03-09 ENCOUNTER — Other Ambulatory Visit: Payer: Self-pay | Admitting: Gynecology

## 2019-03-09 DIAGNOSIS — N6310 Unspecified lump in the right breast, unspecified quadrant: Secondary | ICD-10-CM

## 2019-03-16 DIAGNOSIS — Z01812 Encounter for preprocedural laboratory examination: Secondary | ICD-10-CM | POA: Diagnosis not present

## 2019-03-17 LAB — BASIC METABOLIC PANEL
BUN/Creatinine Ratio: 18 (ref 9–23)
BUN: 14 mg/dL (ref 6–24)
CO2: 22 mmol/L (ref 20–29)
Calcium: 9.7 mg/dL (ref 8.7–10.2)
Chloride: 102 mmol/L (ref 96–106)
Creatinine, Ser: 0.76 mg/dL (ref 0.57–1.00)
GFR calc Af Amer: 108 mL/min/{1.73_m2} (ref >59)
GFR calc non Af Amer: 94 mL/min/{1.73_m2} (ref >59)
Glucose: 76 mg/dL (ref 65–99)
Potassium: 3.6 mmol/L (ref 3.5–5.2)
Sodium: 142 mmol/L (ref 134–144)

## 2019-03-18 HISTORY — PX: EAR CYST EXCISION: SHX22

## 2019-03-24 ENCOUNTER — Telehealth (HOSPITAL_COMMUNITY): Payer: Self-pay | Admitting: Emergency Medicine

## 2019-03-24 ENCOUNTER — Encounter (HOSPITAL_COMMUNITY): Payer: Self-pay

## 2019-03-24 NOTE — Telephone Encounter (Signed)
Reaching out to patient to offer assistance regarding upcoming cardiac imaging study; pt verbalizes understanding of appt date/time, parking situation and where to check in, pre-test NPO status and medications ordered, and verified current allergies; name and call back number provided for further questions should they arise Rockwell [place] RN Navigator Cardiac Imaging [facility] [phone] office [phone] cell  States she is a difficult IV start

## 2019-03-25 ENCOUNTER — Encounter (HOSPITAL_COMMUNITY): Payer: Self-pay

## 2019-03-25 ENCOUNTER — Ambulatory Visit (HOSPITAL_COMMUNITY)
Admission: RE | Admit: 2019-03-25 | Discharge: 2019-03-25 | Disposition: A | Discharge status: Home / Self Care | Payer: BC Managed Care – PPO | Source: Ambulatory Visit | Attending: Cardiology | Admitting: Cardiology

## 2019-03-25 ENCOUNTER — Other Ambulatory Visit: Payer: Self-pay

## 2019-03-25 ENCOUNTER — Encounter: Payer: BC Managed Care – PPO | Admitting: *Deleted

## 2019-03-25 DIAGNOSIS — Z006 Encounter for examination for normal comparison and control in clinical research program: Secondary | ICD-10-CM

## 2019-03-25 DIAGNOSIS — R072 Precordial pain: Secondary | ICD-10-CM | POA: Insufficient documentation

## 2019-03-25 MED ORDER — IOHEXOL 350 MG/ML SOLN
80.0000 mL | Freq: Once | INTRAVENOUS | Status: AC | PRN
  Administered 2019-03-25: 80 mL via INTRAVENOUS

## 2019-03-25 MED ORDER — METOPROLOL TARTRATE 5 MG/5ML IV SOLN
INTRAVENOUS | Status: AC
  Filled 2019-03-25: qty 10

## 2019-03-25 MED ORDER — NITROGLYCERIN 0.4 MG SL SUBL
0.8000 mg | SUBLINGUAL_TABLET | Freq: Once | SUBLINGUAL | Status: AC
  Administered 2019-03-25: 0.8 mg via SUBLINGUAL

## 2019-03-25 MED ORDER — METOPROLOL TARTRATE 5 MG/5ML IV SOLN
5.0000 mg | INTRAVENOUS | Status: DC | PRN
  Administered 2019-03-25 (×2): 5 mg via INTRAVENOUS

## 2019-03-25 MED ORDER — NITROGLYCERIN 0.4 MG SL SUBL
SUBLINGUAL_TABLET | SUBLINGUAL | Status: AC
  Filled 2019-03-25: qty 2

## 2019-03-25 NOTE — Research (Signed)
Subject Name: Cathy Cole  Subject met inclusion and exclusion criteria.  The informed consent form, study requirements and expectations were reviewed with the subject and questions and concerns were addressed prior to the signing of the consent form.  The subject verbalized understanding of the trial requirements.  The subject agreed to participate in the CADFEM G4 trial and signed the informed consent at 1130 on01/08/21.  The informed consent was obtained prior to performance of any protocol-specific procedures for the subject.  A copy of the signed informed consent was given to the subject and a copy was placed in the subject's medical record.   Star Age [place]

## 2019-03-28 ENCOUNTER — Ambulatory Visit
Admission: RE | Admit: 2019-03-28 | Discharge: 2019-03-28 | Disposition: A | Discharge status: Home / Self Care | Payer: BC Managed Care – PPO | Source: Ambulatory Visit | Attending: Gynecology | Admitting: Gynecology

## 2019-03-28 ENCOUNTER — Other Ambulatory Visit: Payer: Self-pay

## 2019-03-28 DIAGNOSIS — R922 Inconclusive mammogram: Secondary | ICD-10-CM | POA: Diagnosis not present

## 2019-03-28 DIAGNOSIS — N6310 Unspecified lump in the right breast, unspecified quadrant: Secondary | ICD-10-CM

## 2019-03-28 DIAGNOSIS — N6311 Unspecified lump in the right breast, upper outer quadrant: Secondary | ICD-10-CM | POA: Diagnosis not present

## 2019-05-02 ENCOUNTER — Ambulatory Visit: Payer: BC Managed Care – PPO | Admitting: Cardiology

## 2019-05-20 ENCOUNTER — Other Ambulatory Visit: Payer: Self-pay

## 2019-05-20 ENCOUNTER — Ambulatory Visit: Payer: BC Managed Care – PPO | Admitting: Cardiology

## 2019-05-20 ENCOUNTER — Encounter: Payer: Self-pay | Admitting: Cardiology

## 2019-05-20 VITALS — BP 120/74 | HR 82 | Temp 97.4°F | Ht 64.0 in | Wt 189.8 lb

## 2019-05-20 DIAGNOSIS — R079 Chest pain, unspecified: Secondary | ICD-10-CM | POA: Diagnosis not present

## 2019-05-20 DIAGNOSIS — I10 Essential (primary) hypertension: Secondary | ICD-10-CM

## 2019-05-20 NOTE — Patient Instructions (Signed)
Medication Instructions:  Your physician recommends that you continue on your current medications as directed. Please refer to the Current Medication list given to you today.  *If you need a refill on your cardiac medications before your next appointment, please call your pharmacy*   Lab Work: NONE  Testing/Procedures: NONE  Follow-Up: At [facility], you and your health needs are our priority.  As part of our continuing mission to provide you with exceptional heart care, we have created designated Provider Care Teams.  These Care Teams include your primary Cardiologist (physician) and Advanced Practice Providers (APPs -  Physician Assistants and Nurse Practitioners) who all work together to provide you with the care you need, when you need it.  We recommend signing up for the patient portal called "MyChart".  Sign up information is provided on this After Visit Summary.  MyChart is used to connect with patients for Virtual Visits (Telemedicine).  Patients are able to view lab/test results, encounter notes, upcoming appointments, etc.  Non-urgent messages can be sent to your provider as well.   To learn more about what you can do with MyChart, go to [email or web address].    Your next appointment:   12 month(s)  The format for your next appointment:   In Person  Provider:   Christopher Schumann, MD      

## 2019-05-20 NOTE — Progress Notes (Signed)
Cardiology Office Note:    Date:  05/22/2019   ID:  Cathy Cole, DOB 23-Dec-1970, MRN [id number]  PCP:  Georgina Quint, MD  Cardiologist:  No primary care provider on file.  Electrophysiologist:  None   Referring MD: Georgina Quint, *   Chief Complaint  Patient presents with  . Chest Pain    History of Present Illness:    Cathy Cole is a 49 y.o. female with a hx of hypertension who presents for follow-up.  She was referred by Dr. Alvy Bimler for an initial evaluation of chest pain on 01/25/2019. Presented to the ED on 01/17/2019 with chest pain. Normal EKG and troponins were negative. Work-up was unremarkable and she was discharged.  Reports that she has chest pain daily.  Reports pain is left-sided, describes as dull aching pain, 3 out of 10 in intensity.  Can last for 20 to 30 minutes but up to several hours.  She states that she does not get regular exercise but does walk up the stairs in her home.  Has not noted chest pain with exertion.  Does note that chest pain seems to occur when her blood pressure is elevated.  Denies any shortness of breath.    She previously saw a cardiologist in 2012 in [place].  Was being seen for hypertension and palpitations.  Wore heart monitor and was told no arrhythmia.  Had stress test and was told was normal.  Does not have significant issues with palpitations anymore.  Never smoker.  No family history heart disease in immediate family.  TTE on 02/03/2019 showed normal LV systolic function, grade 1 diastolic dysfunction.  Coronary CTA on 03/24/2018 showed normal coronary arteries.  Since last clinic visit, she reports that she continues to have chest pain.  Occurs about twice per month.  States that it seems to correlate with her menstrual cycle.  Describes a dull aching left-sided chest pain.  Reports that her blood pressure has been well controlled.  Past Medical History:  Diagnosis Date  . Abnormal uterine bleeding (AUB)  11/07/2014  . Allergy   . Anxiety   . Hypertension   . Menorrhagia 11/07/2014    Past Surgical History:  Procedure Laterality Date  . BUNIONECTOMY    . COSMETIC SURGERY    . NOVASURE ABLATION N/A 11/08/2014   Procedure: NOVASURE ABLATION;  Surgeon: Sherian Rein, MD;  Location: [facility];  Service: Gynecology;  Laterality: N/A;  . TUBAL LIGATION      Current Medications: Current Meds  Medication Sig  . acetaminophen (TYLENOL) 500 MG tablet Take 1,000 mg by mouth every 6 (six) hours as needed for mild pain, moderate pain or headache.  . Cholecalciferol (VITAMIN D3) 2000 UNITS TABS Take 2,000 Units by mouth daily.   Marland Kitchen LORazepam (ATIVAN) 0.5 MG tablet Take 1 tablet (0.5 mg total) by mouth daily as needed for anxiety.  Marland Kitchen losartan-hydrochlorothiazide (HYZAAR) 50-12.5 MG tablet Take 1 tablet by mouth daily.     Allergies:   Bactrim [sulfamethoxazole-trimethoprim]   Social History   Socioeconomic History  . Marital status: Divorced    Spouse name: Not on file  . Number of children: 3  . Years of education: Not on file  . Highest education level: Not on file  Occupational History  . Not on file  Tobacco Use  . Smoking status: Never Smoker  . Smokeless tobacco: Never Used  Substance and Sexual Activity  . Alcohol use: No  . Drug use: Never  .  Sexual activity: Yes  Other Topics Concern  . Not on file  Social History Narrative  . Not on file   Social Determinants of Health   Financial Resource Strain:   . Difficulty of Paying Living Expenses: Not on file  Food Insecurity:   . Worried About Programme researcher, broadcasting/film/video in the Last Year: Not on file  . Ran Out of Food in the Last Year: Not on file  Transportation Needs:   . Lack of Transportation (Medical): Not on file  . Lack of Transportation (Non-Medical): Not on file  Physical Activity:   . Days of Exercise per Week: Not on file  . Minutes of Exercise per Session: Not on file  Stress:   . Feeling of Stress : Not on file   Social Connections:   . Frequency of Communication with Friends and Family: Not on file  . Frequency of Social Gatherings with Friends and Family: Not on file  . Attends Religious Services: Not on file  . Active Member of Clubs or Organizations: Not on file  . Attends Banker Meetings: Not on file  . Marital Status: Not on file     Family History: The patient's family history includes Diabetes in her maternal grandmother and another family member; Hypertension in her maternal grandmother, mother, and another family member.  ROS:   Please see the history of present illness.     All other systems reviewed and are negative.  EKGs/Labs/Other Studies Reviewed:    The following studies were reviewed today:   EKG:  EKG is  ordered today.  The ekg ordered today demonstrates sinus rhythm, rate 88, nonspecific T wave flattening in V3-6, I, III, aVL  Coronary CTA 03/25/19: 1. Coronary calcium score of 0. This was 0 percentile for age and sex matched control. 2. Normal coronary origin with right dominance. 3. No evidence of CAD.  CAD-RADS 0. No evidence of CAD (0%). Consider non-atherosclerotic causes of chest pain.  IMPRESSION: No significant incidental noncardiac findings are noted.  TTE 02/03/2019: 1. Left ventricular ejection fraction, by visual estimation, is 55 to  60%. The left ventricle has normal function. There is no left ventricular  hypertrophy.  2. Left ventricular diastolic parameters are consistent with Grade I  diastolic dysfunction (impaired relaxation).  3. Global right ventricle has normal systolic function.The right  ventricular size is normal.  4. Left atrial size was normal.  5. Right atrial size was normal.  6. The mitral valve is normal in structure. No evidence of mitral valve  regurgitation. No evidence of mitral stenosis.  7. The tricuspid valve is normal in structure. Tricuspid valve  regurgitation is trivial.  8. The aortic valve  is tricuspid. Aortic valve regurgitation is not  visualized. No evidence of aortic valve sclerosis or stenosis.  9. The pulmonic valve was normal in structure. Pulmonic valve  regurgitation is not visualized.  10. The inferior vena cava is normal in size with greater than 50%  respiratory variability, suggesting right atrial pressure of 3 mmHg.  11. Normal LV systolic function; grade 1 diastolic dysfunction.   Recent Labs: 01/17/2019: Hemoglobin 14.1; Platelets 367 02/14/2019: ALT 12 03/16/2019: BUN 14; Creatinine, Ser 0.76; Potassium 3.6; Sodium 142  Recent Lipid Panel    Component Value Date/Time   CHOL 151 02/14/2019 1024   TRIG 147 02/14/2019 1024   HDL 61 02/14/2019 1024   CHOLHDL 2.5 02/14/2019 1024   LDLCALC 65 02/14/2019 1024    Physical Exam:  VS:  BP 120/74   Pulse 82   Temp (!) 97.4 F (36.3 C)   Ht 5\' 4"  (1.626 m)   Wt 189 lb 12.8 oz (86.1 kg)   SpO2 97%   BMI 32.58 kg/m     Wt Readings from Last 3 Encounters:  05/20/19 189 lb 12.8 oz (86.1 kg)  02/14/19 179 lb 9.6 oz (81.5 kg)  01/25/19 181 lb 6.4 oz (82.3 kg)     GEN:  Well nourished, well developed in no acute distress HEENT: Normal NECK: No JVD; No carotid bruits LYMPHATICS: No lymphadenopathy CARDIAC: RRR, no murmurs, rubs, gallops RESPIRATORY:  Clear to auscultation without rales, wheezing or rhonchi  ABDOMEN: Soft, non-tender, non-distended MUSCULOSKELETAL:  No edema; No deformity  SKIN: Warm and dry NEUROLOGIC:  Alert and oriented x 3 PSYCHIATRIC:  Normal affect   ASSESSMENT:    1. Chest pain of uncertain etiology   2. Essential hypertension    PLAN:    In order of problems listed above:  Chest pain: Coronary CTA showed normal coronary arteries.  No structural heart disease on TTE.  Suspect noncardiac chest pain.  No further cardiac work-up recommended  Hypertension: On losartan-hydrochlorothiazide 50-12.5 mg.  Appears controlled  RTC in 1 year   Medication Adjustments/Labs and  Tests Ordered: Current medicines are reviewed at length with the patient today.  Concerns regarding medicines are outlined above.  No orders of the defined types were placed in this encounter.  No orders of the defined types were placed in this encounter.   Patient Instructions  Medication Instructions:  Your physician recommends that you continue on your current medications as directed. Please refer to the Current Medication list given to you today.  *If you need a refill on your cardiac medications before your next appointment, please call your pharmacy*   Lab Work: NONE  Testing/Procedures: NONE  Follow-Up: At [facility], you and your health needs are our priority.  As part of our continuing mission to provide you with exceptional heart care, we have created designated Provider Care Teams.  These Care Teams include your primary Cardiologist (physician) and Advanced Practice Providers (APPs -  Physician Assistants and Nurse Practitioners) who all work together to provide you with the care you need, when you need it.  We recommend signing up for the patient portal called "MyChart".  Sign up information is provided on this After Visit Summary.  MyChart is used to connect with patients for Virtual Visits (Telemedicine).  Patients are able to view lab/test results, encounter notes, upcoming appointments, etc.  Non-urgent messages can be sent to your provider as well.   To learn more about what you can do with MyChart, go to [email or web address].    Your next appointment:   12 month(s)  The format for your next appointment:   In Person  Provider:   Oswaldo Milian, MD        Signed, Donato Heinz, MD  05/22/2019 4:22 PM    [place]

## 2019-05-30 DIAGNOSIS — H7101 Cholesteatoma of attic, right ear: Secondary | ICD-10-CM | POA: Diagnosis not present

## 2019-06-07 DIAGNOSIS — H7101 Cholesteatoma of attic, right ear: Secondary | ICD-10-CM | POA: Diagnosis not present

## 2019-06-07 DIAGNOSIS — H9011 Conductive hearing loss, unilateral, right ear, with unrestricted hearing on the contralateral side: Secondary | ICD-10-CM | POA: Diagnosis not present

## 2019-06-07 DIAGNOSIS — H6121 Impacted cerumen, right ear: Secondary | ICD-10-CM | POA: Insufficient documentation

## 2019-06-07 DIAGNOSIS — H90A31 Mixed conductive and sensorineural hearing loss, unilateral, right ear with restricted hearing on the contralateral side: Secondary | ICD-10-CM | POA: Insufficient documentation

## 2019-06-09 ENCOUNTER — Ambulatory Visit: Payer: BC Managed Care – PPO | Attending: Internal Medicine

## 2019-06-09 DIAGNOSIS — Z23 Encounter for immunization: Secondary | ICD-10-CM

## 2019-06-09 NOTE — Progress Notes (Signed)
   Covid-19 Vaccination Clinic  Name:  Cathy Cole    MRN: [id number] DOB: Jan 01, 1971  06/09/2019  [name] was observed post Covid-19 immunization for 15 minutes without incident. She was provided with Vaccine Information Sheet and instruction to access the V-Safe system.   [name] was instructed to call 911 with any severe reactions post vaccine: Marland Kitchen Difficulty breathing  . Swelling of face and throat  . A fast heartbeat  . A bad rash all over body  . Dizziness and weakness   Immunizations Administered    Name Date Dose VIS Date Route   Pfizer COVID-19 Vaccine 06/09/2019  3:06 PM 0.3 mL 02/25/2019 Intramuscular   Manufacturer: [employer], [employer]   Lot: [id number]   NDC: [address]-4

## 2019-06-25 ENCOUNTER — Other Ambulatory Visit: Payer: Self-pay | Admitting: Emergency Medicine

## 2019-06-25 DIAGNOSIS — F411 Generalized anxiety disorder: Secondary | ICD-10-CM

## 2019-06-25 NOTE — Telephone Encounter (Signed)
Requested medication (s) are due for refill today: yes  Requested medication (s) are on the active medication list: yes  Last refill:  02/14/19  Future visit scheduled: no  Notes to clinic:  med not delegated to NT to refill   Requested Prescriptions  Pending Prescriptions Disp Refills   LORazepam (ATIVAN) 0.5 MG tablet [Pharmacy Med Name: LORAZEPAM 0.5 MG TABLET] 30 tablet 0    Sig: TAKE 1 TABLET BY MOUTH EVERY DAY AS NEEDED FOR ANXIETY      Not Delegated - Psychiatry:  Anxiolytics/Hypnotics Failed - 06/25/2019  3:07 PM      Failed - This refill cannot be delegated      Failed - Urine Drug Screen completed in last 360 days.      Passed - Valid encounter within last 6 months    Recent Outpatient Visits           4 months ago Routine general medical examination at a health care facility   Primary Care at [facility], Eilleen Kempf, MD   5 months ago Essential hypertension   Primary Care at [facility], Eilleen Kempf, MD   9 months ago Acute costochondritis   Primary Care at [facility], Eilleen Kempf, MD   10 months ago Essential hypertension   Primary Care at [facility], Eilleen Kempf, MD   11 months ago Vaginal discharge   Primary Care at [facility], Minerva Fester, MD

## 2019-06-27 ENCOUNTER — Other Ambulatory Visit: Payer: Self-pay | Admitting: *Deleted

## 2019-06-27 ENCOUNTER — Telehealth: Payer: Self-pay | Admitting: *Deleted

## 2019-06-27 NOTE — Telephone Encounter (Signed)
error 

## 2019-06-28 ENCOUNTER — Telehealth: Payer: Self-pay | Admitting: *Deleted

## 2019-06-28 NOTE — Telephone Encounter (Signed)
Left message in mobile voice mail Lorazepam has been refilled, check with your pharmacy later today.

## 2019-06-29 NOTE — Telephone Encounter (Signed)
Spoke to patient about refill request Lorazepam, per patient is on her way to get it now.

## 2019-06-30 ENCOUNTER — Encounter: Payer: Self-pay | Admitting: *Deleted

## 2019-06-30 DIAGNOSIS — Z006 Encounter for examination for normal comparison and control in clinical research program: Secondary | ICD-10-CM

## 2019-06-30 NOTE — Research (Signed)
CADFEM 90 day phone call follow up    Called patient for 90 day phone call and she states she is doing well. No problems since CTA and no other test done. This concludes the patient's participation in the study.       Cathy Cole  06/30/2019 14:09 p.m.

## 2019-07-04 ENCOUNTER — Ambulatory Visit: Payer: BC Managed Care – PPO | Attending: Internal Medicine

## 2019-07-04 DIAGNOSIS — Z23 Encounter for immunization: Secondary | ICD-10-CM

## 2019-07-04 NOTE — Progress Notes (Signed)
   Covid-19 Vaccination Clinic  Name:  Cathy Cole    MRN: [id number] DOB: 11-25-1970  07/04/2019  [name] was observed post Covid-19 immunization for 15 minutes without incident. She was provided with Vaccine Information Sheet and instruction to access the V-Safe system.   [name] was instructed to call 911 with any severe reactions post vaccine: Marland Kitchen Difficulty breathing  . Swelling of face and throat  . A fast heartbeat  . A bad rash all over body  . Dizziness and weakness   Immunizations Administered    Name Date Dose VIS Date Route   Pfizer COVID-19 Vaccine 07/04/2019 12:45 PM 0.3 mL 05/11/2018 Intramuscular   Manufacturer: [employer], [employer]   Lot: [id number]   NDC: [address]-3

## 2019-07-07 ENCOUNTER — Other Ambulatory Visit: Payer: Self-pay | Admitting: Emergency Medicine

## 2019-07-07 DIAGNOSIS — I10 Essential (primary) hypertension: Secondary | ICD-10-CM

## 2019-07-08 ENCOUNTER — Other Ambulatory Visit: Payer: Self-pay

## 2019-07-08 DIAGNOSIS — I10 Essential (primary) hypertension: Secondary | ICD-10-CM

## 2019-07-08 MED ORDER — LOSARTAN POTASSIUM-HCTZ 50-12.5 MG PO TABS: 1.0000 | ORAL_TABLET | Freq: Every day | ORAL | 3 refills | Status: DC

## 2019-07-25 ENCOUNTER — Ambulatory Visit: Payer: Self-pay | Admitting: *Deleted

## 2019-07-25 ENCOUNTER — Encounter (INDEPENDENT_AMBULATORY_CARE_PROVIDER_SITE_OTHER): Payer: Self-pay

## 2019-07-25 NOTE — Telephone Encounter (Signed)
Patient is feeling twitching in legs- patient states she is having twitching- cramping in Left leg- it can be in calve, thigh or back of leg. Patient states she has had this for at least 1 week- patient states she does not have swelling or pain. Patient advise to keep eye on her intake of electrolytes and calcium/magnieseum. Being that it is just left leg- she does not remember injuring it - but could be nerve related. Call to office - appointment scheduled.  Reason for Disposition . Muscle aches are a chronic symptom (recurrent or ongoing AND present > 4 weeks)  Answer Assessment - Initial Assessment Questions 1. ONSET: "When did the muscle aches or body pains start?"      Cramping-twitching started 1 week ago 2. LOCATION: "What part of your body is hurting?" (e.g., entire body, arms, legs)      Left leg-calf and some in thigh 3. SEVERITY: "How bad is the pain?" (Scale 1-10; or mild, moderate, severe)   - MILD (1-3): doesn't interfere with normal activities    - MODERATE (4-7): interferes with normal activities or awakens from sleep    - SEVERE (8-10):  excruciating pain, unable to do any normal activities      mild 4. CAUSE: "What do you think is causing the pains?"     Low K+ level- vitamin D deficiant 5. FEVER: "Have you been having fever?"     no 6. OTHER SYMPTOMS: "Do you have any other symptoms?" (e.g., chest pain, weakness, rash, cold or flu symptoms, weight loss)     no 7. PREGNANCY: "Is there any chance you are pregnant?" "When was your last menstrual period?"     LMP- 2 months ago- no- BTL 8. TRAVEL: "Have you traveled out of the country in the last month?" (e.g., travel history, exposures)     no  Protocols used: MUSCLE ACHES AND BODY PAIN-A-AH

## 2019-07-26 ENCOUNTER — Encounter: Payer: Self-pay | Admitting: Emergency Medicine

## 2019-07-26 ENCOUNTER — Ambulatory Visit: Payer: BC Managed Care – PPO | Admitting: Emergency Medicine

## 2019-07-26 ENCOUNTER — Other Ambulatory Visit: Payer: Self-pay

## 2019-07-26 VITALS — BP 127/84 | HR 98 | Temp 98.0°F | Resp 16 | Ht 64.0 in | Wt 181.0 lb

## 2019-07-26 DIAGNOSIS — I10 Essential (primary) hypertension: Secondary | ICD-10-CM

## 2019-07-26 DIAGNOSIS — R252 Cramp and spasm: Secondary | ICD-10-CM | POA: Diagnosis not present

## 2019-07-26 NOTE — Patient Instructions (Addendum)
If you have lab work done today you will be contacted with your lab results within the next 2 weeks.  If you have not heard from [place] then please contact us. The fastest way to get your results is to register for My Chart.   IF you received an x-ray today, you will receive an invoice from [facility]. Please contact [facility] at [phone] with questions or concerns regarding your invoice.   IF you received labwork today, you will receive an invoice from [facility]. Please contact [facility] at [phone] with questions or concerns regarding your invoice.   Our billing staff will not be able to assist you with questions regarding bills from these companies.  You will be contacted with the lab results as soon as they are available. The fastest way to get your results is to activate your My Chart account. Instructions are located on the last page of this paperwork. If you have not heard from [place] regarding the results in 2 weeks, please contact this office.     Muscle Cramps and Spasms Muscle cramps and spasms are when muscles tighten by themselves. They usually get better within minutes. Muscle cramps are painful. They are usually stronger and last longer than muscle spasms. Muscle spasms may or may not be painful. They can last a few seconds or much longer. Cramps and spasms can affect any muscle, but they occur most often in the calf muscles of the leg. They are usually not caused by a serious problem. In many cases, the cause is not known. Some common causes include:  Doing more physical work or exercise than your body is ready for.  Using the muscles too much (overuse) by repeating certain movements too many times.  Staying in a certain position for a long time.  Playing a sport or doing an activity without preparing properly.  Using bad form or technique while playing a sport or doing an activity.  Not having enough water in your body  (dehydration).  Injury.  Side effects of some medicines.  Low levels of the salts and minerals in your blood (electrolytes), such as low potassium or calcium. Follow these instructions at home: Managing pain and stiffness      Massage, stretch, and relax the muscle. Do this for many minutes at a time.  If told, put heat on tight or tense muscles as often as told by your doctor. Use the heat source that your doctor recommends, such as a moist heat pack or a heating pad. ? Place a towel between your skin and the heat source. ? Leave the heat on for 20-30 minutes. ? Remove the heat if your skin turns bright red. This is very important if you are not able to feel pain, heat, or cold. You may have a greater risk of getting burned.  If told, put ice on the affected area. This may help if you are sore or have pain after a cramp or spasm. ? Put ice in a plastic bag. ? Place a towel between your skin and the bag. ? Leave the ice on for 20 minutes, 2-3 times a day.  Try taking hot showers or baths to help relax tight muscles. Eating and drinking  Drink enough fluid to keep your pee (urine) pale yellow.  Eat a healthy diet to help ensure that your muscles work well. This should include: ? Fruits and vegetables. ? Lean protein. ? Whole grains. ? Low-fat or nonfat dairy products. General instructions  If you are having cramps often, avoid intense exercise for several days.  Take over-the-counter and prescription medicines only as told by your doctor.  Watch for any changes in your symptoms.  Keep all follow-up visits as told by your doctor. This is important. Contact a doctor if:  Your cramps or spasms get worse or happen more often.  Your cramps or spasms do not get better with time. Summary  Muscle cramps and spasms are when muscles tighten by themselves. They usually get better within minutes.  Cramps and spasms occur most often in the calf muscles of the leg.  Massage,  stretch, and relax the muscle. This may help the cramp or spasm go away.  Drink enough fluid to keep your pee (urine) pale yellow. This information is not intended to replace advice given to you by your health care provider. Make sure you discuss any questions you have with your health care provider. Document Revised: 07/27/2017 Document Reviewed: 07/27/2017 Elsevier Patient Education  [place].

## 2019-07-26 NOTE — Progress Notes (Signed)
Cathy Cole 49 y.o.   Chief Complaint  Patient presents with  . muscle cramps    LEFT LEG since Saturday     HISTORY OF PRESENT ILLNESS: This is a 49 y.o. female complaining of muscle cramp to left lower leg started 4 days ago. No other associated symptoms.  Denies injury.  No new medications. History of hypertension on Hyzaar and metoprolol. Also history of generalized anxiety disorder. No other complaints or medical concerns today.  HPI   Prior to Admission medications   Medication Sig Start Date End Date Taking? Authorizing Provider  acetaminophen (TYLENOL) 500 MG tablet Take 1,000 mg by mouth every 6 (six) hours as needed for mild pain, moderate pain or headache.   Yes [provider]  Cholecalciferol (VITAMIN D3) 2000 UNITS TABS Take 2,000 Units by mouth daily.    Yes [provider]  LORazepam (ATIVAN) 0.5 MG tablet TAKE 1 TABLET BY MOUTH EVERY DAY AS NEEDED FOR ANXIETY 06/28/19  Yes Stallings, Zoe A, MD  losartan-hydrochlorothiazide (HYZAAR) 50-12.5 MG tablet Take 1 tablet by mouth daily. 07/08/19  Yes SagardiaEilleen Kempf, MD  Metoprolol Succinate 25 MG CS24 Take by mouth daily.   Yes [provider]  metoprolol tartrate (LOPRESSOR) 100 MG tablet TAKE 1 TABLET 2 HR PRIOR TO CARDIAC PROCEDURE 01/25/19   Little Ishikawa, MD    Allergies  Allergen Reactions  . Bactrim [Sulfamethoxazole-Trimethoprim] Anaphylaxis    Chest pain    Patient Active Problem List   Diagnosis Date Noted  . Essential hypertension 08/10/2018  . Generalized anxiety disorder 05/10/2018    Past Medical History:  Diagnosis Date  . Abnormal uterine bleeding (AUB) 11/07/2014  . Allergy   . Anxiety   . Hypertension   . Menorrhagia 11/07/2014    Past Surgical History:  Procedure Laterality Date  . BUNIONECTOMY    . COSMETIC SURGERY    . NOVASURE ABLATION N/A 11/08/2014   Procedure: NOVASURE ABLATION;  Surgeon: Sherian Rein, MD;  Location: [facility];   Service: Gynecology;  Laterality: N/A;  . TUBAL LIGATION      Social History   Socioeconomic History  . Marital status: Divorced    Spouse name: Not on file  . Number of children: 3  . Years of education: Not on file  . Highest education level: Not on file  Occupational History  . Not on file  Tobacco Use  . Smoking status: Never Smoker  . Smokeless tobacco: Never Used  Substance and Sexual Activity  . Alcohol use: No  . Drug use: Never  . Sexual activity: Yes  Other Topics Concern  . Not on file  Social History Narrative  . Not on file   Social Determinants of Health   Financial Resource Strain:   . Difficulty of Paying Living Expenses:   Food Insecurity:   . Worried About Programme researcher, broadcasting/film/video in the Last Year:   . Barista in the Last Year:   Transportation Needs:   . Freight forwarder (Medical):   Marland Kitchen Lack of Transportation (Non-Medical):   Physical Activity:   . Days of Exercise per Week:   . Minutes of Exercise per Session:   Stress:   . Feeling of Stress :   Social Connections:   . Frequency of Communication with Friends and Family:   . Frequency of Social Gatherings with Friends and Family:   . Attends Religious Services:   . Active Member of Clubs or Organizations:   .  Attends Archivist Meetings:   Marland Kitchen Marital Status:   Intimate Partner Violence:   . Fear of Current or Ex-Partner:   . Emotionally Abused:   Marland Kitchen Physically Abused:   . Sexually Abused:     Family History  Problem Relation Age of Onset  . Diabetes Other   . Hypertension Other   . Hypertension Mother   . Diabetes Maternal Grandmother   . Hypertension Maternal Grandmother      Review of Systems  Constitutional: Negative.  Negative for chills and fever.  HENT: Negative.  Negative for congestion and sore throat.   Respiratory: Negative.  Negative for cough and shortness of breath.   Cardiovascular: Negative.  Negative for chest pain and palpitations.    Gastrointestinal: Negative.  Negative for abdominal pain, blood in stool, diarrhea, melena, nausea and vomiting.  Genitourinary: Negative.  Negative for dysuria and hematuria.  Musculoskeletal: Negative for back pain.  Skin: Negative.  Negative for rash.  Neurological: Negative.  Negative for dizziness, sensory change, focal weakness and headaches.  All other systems reviewed and are negative.  Today's Vitals   07/26/19 1417  BP: 127/84  Pulse: 98  Resp: 16  Temp: 98 F (36.7 C)  TempSrc: Temporal  Weight: 181 lb (82.1 kg)  Height: 5\' 4"  (1.626 m)   Body mass index is 31.07 kg/m.   Physical Exam Vitals reviewed.  Constitutional:      Appearance: Normal appearance.  HENT:     Head: Normocephalic.  Eyes:     Extraocular Movements: Extraocular movements intact.     Pupils: Pupils are equal, round, and reactive to light.  Cardiovascular:     Rate and Rhythm: Normal rate and regular rhythm.     Heart sounds: Normal heart sounds.  Pulmonary:     Effort: Pulmonary effort is normal.     Breath sounds: Normal breath sounds.  Abdominal:     General: Bowel sounds are normal. There is no distension.     Palpations: Abdomen is soft.     Tenderness: There is no abdominal tenderness.  Musculoskeletal:        General: Normal range of motion.     Cervical back: Normal range of motion and neck supple.     Comments: Lower extremities: Warm to touch.  No erythema and no ecchymosis.  Good distal peripheral circulation with excellent capillary refill and very good sensation.  Full range of motion.  No abnormal findings.  No signs of DVT on either leg.  Skin:    General: Skin is warm and dry.     Capillary Refill: Capillary refill takes less than 2 seconds.  Neurological:     General: No focal deficit present.     Mental Status: She is alert and oriented to person, place, and time.  Psychiatric:        Mood and Affect: Mood normal.        Behavior: Behavior normal.       ASSESSMENT & PLAN: Pari was seen today for muscle cramps.  Diagnoses and all orders for this visit:  Muscle cramp Comments: Left lower leg Orders: -     CBC with Differential/Platelet -     Comprehensive metabolic panel -     Hemoglobin A1c -     CK  Essential hypertension    Patient Instructions       If you have lab work done today you will be contacted with your lab results within the next 2 weeks.  If you have not heard from [place] then please contact us. The fastest way to get your results is to register for My Chart.   IF you received an x-ray today, you will receive an invoice from [facility]. Please contact [facility] at [phone] with questions or concerns regarding your invoice.   IF you received labwork today, you will receive an invoice from [place]. Please contact [facility] at [phone] with questions or concerns regarding your invoice.   Our billing staff will not be able to assist you with questions regarding bills from these companies.  You will be contacted with the lab results as soon as they are available. The fastest way to get your results is to activate your My Chart account. Instructions are located on the last page of this paperwork. If you have not heard from [place] regarding the results in 2 weeks, please contact this office.     Muscle Cramps and Spasms Muscle cramps and spasms are when muscles tighten by themselves. They usually get better within minutes. Muscle cramps are painful. They are usually stronger and last longer than muscle spasms. Muscle spasms may or may not be painful. They can last a few seconds or much longer. Cramps and spasms can affect any muscle, but they occur most often in the calf muscles of the leg. They are usually not caused by a serious problem. In many cases, the cause is not known. Some common causes include:  Doing more physical work or exercise than your body is ready for.  Using the  muscles too much (overuse) by repeating certain movements too many times.  Staying in a certain position for a long time.  Playing a sport or doing an activity without preparing properly.  Using bad form or technique while playing a sport or doing an activity.  Not having enough water in your body (dehydration).  Injury.  Side effects of some medicines.  Low levels of the salts and minerals in your blood (electrolytes), such as low potassium or calcium. Follow these instructions at home: Managing pain and stiffness      Massage, stretch, and relax the muscle. Do this for many minutes at a time.  If told, put heat on tight or tense muscles as often as told by your doctor. Use the heat source that your doctor recommends, such as a moist heat pack or a heating pad. ? Place a towel between your skin and the heat source. ? Leave the heat on for 20-30 minutes. ? Remove the heat if your skin turns bright red. This is very important if you are not able to feel pain, heat, or cold. You may have a greater risk of getting burned.  If told, put ice on the affected area. This may help if you are sore or have pain after a cramp or spasm. ? Put ice in a plastic bag. ? Place a towel between your skin and the bag. ? Leave the ice on for 20 minutes, 2-3 times a day.  Try taking hot showers or baths to help relax tight muscles. Eating and drinking  Drink enough fluid to keep your pee (urine) pale yellow.  Eat a healthy diet to help ensure that your muscles work well. This should include: ? Fruits and vegetables. ? Lean protein. ? Whole grains. ? Low-fat or nonfat dairy products. General instructions  If you are having cramps often, avoid intense exercise for several days.  Take over-the-counter and prescription medicines only as told by your  doctor.  Watch for any changes in your symptoms.  Keep all follow-up visits as told by your doctor. This is important. Contact a doctor  if:  Your cramps or spasms get worse or happen more often.  Your cramps or spasms do not get better with time. Summary  Muscle cramps and spasms are when muscles tighten by themselves. They usually get better within minutes.  Cramps and spasms occur most often in the calf muscles of the leg.  Massage, stretch, and relax the muscle. This may help the cramp or spasm go away.  Drink enough fluid to keep your pee (urine) pale yellow. This information is not intended to replace advice given to you by your health care provider. Make sure you discuss any questions you have with your health care provider. Document Revised: 07/27/2017 Document Reviewed: 07/27/2017 Elsevier Patient Education  2020 Elsevier Inc.      Edwina Barth, MD [facility]

## 2019-07-27 ENCOUNTER — Encounter: Payer: Self-pay | Admitting: Emergency Medicine

## 2019-07-27 LAB — COMPREHENSIVE METABOLIC PANEL
ALT: 19 IU/L (ref 0–32)
AST: 15 IU/L (ref 0–40)
Albumin/Globulin Ratio: 1.5 (ref 1.2–2.2)
Albumin: 4.6 g/dL (ref 3.8–4.8)
Alkaline Phosphatase: 91 IU/L (ref 39–117)
BUN/Creatinine Ratio: 15 (ref 9–23)
BUN: 13 mg/dL (ref 6–24)
Bilirubin Total: 0.6 mg/dL (ref 0.0–1.2)
CO2: 25 mmol/L (ref 20–29)
Calcium: 9.9 mg/dL (ref 8.7–10.2)
Chloride: 102 mmol/L (ref 96–106)
Creatinine, Ser: 0.85 mg/dL (ref 0.57–1.00)
GFR calc Af Amer: 94 mL/min/{1.73_m2} (ref >59)
GFR calc non Af Amer: 81 mL/min/{1.73_m2} (ref >59)
Globulin, Total: 3.1 g/dL (ref 1.5–4.5)
Glucose: 98 mg/dL (ref 65–99)
Potassium: 4 mmol/L (ref 3.5–5.2)
Sodium: 141 mmol/L (ref 134–144)
Total Protein: 7.7 g/dL (ref 6.0–8.5)

## 2019-07-27 LAB — CBC WITH DIFFERENTIAL/PLATELET
Basophils Absolute: 0.1 10*3/uL (ref 0.0–0.2)
Basos: 0 %
EOS (ABSOLUTE): 0.2 10*3/uL (ref 0.0–0.4)
Eos: 2 %
Hematocrit: 42.3 % (ref 34.0–46.6)
Hemoglobin: 14.2 g/dL (ref 11.1–15.9)
Immature Grans (Abs): 0 10*3/uL (ref 0.0–0.1)
Immature Granulocytes: 0 %
Lymphocytes Absolute: 2.6 10*3/uL (ref 0.7–3.1)
Lymphs: 22 %
MCH: 30.3 pg (ref 26.6–33.0)
MCHC: 33.6 g/dL (ref 31.5–35.7)
MCV: 90 fL (ref 79–97)
Monocytes Absolute: 0.8 10*3/uL (ref 0.1–0.9)
Monocytes: 7 %
Neutrophils Absolute: 8.3 10*3/uL — ABNORMAL HIGH (ref 1.4–7.0)
Neutrophils: 69 %
Platelets: 343 10*3/uL (ref 150–450)
RBC: 4.69 x10E6/uL (ref 3.77–5.28)
RDW: 12.5 % (ref 11.7–15.4)
WBC: 12.1 10*3/uL — ABNORMAL HIGH (ref 3.4–10.8)

## 2019-07-27 LAB — HEMOGLOBIN A1C
Est. average glucose Bld gHb Est-mCnc: 108 mg/dL
Hgb A1c MFr Bld: 5.4 % (ref 4.8–5.6)

## 2019-07-27 LAB — CK: Total CK: 63 U/L (ref 32–182)

## 2019-08-10 ENCOUNTER — Other Ambulatory Visit: Payer: Self-pay | Admitting: Otolaryngology

## 2019-08-10 DIAGNOSIS — H7191 Unspecified cholesteatoma, right ear: Secondary | ICD-10-CM

## 2019-08-17 ENCOUNTER — Other Ambulatory Visit: Payer: Self-pay | Admitting: Emergency Medicine

## 2019-08-17 ENCOUNTER — Telehealth: Payer: Self-pay | Admitting: Cardiology

## 2019-08-17 DIAGNOSIS — I10 Essential (primary) hypertension: Secondary | ICD-10-CM

## 2019-08-17 NOTE — Telephone Encounter (Signed)
Requested medications are due for refill today?  Yes - Listed as a historical medication.    Requested medications are on active medication list?  Listed as a historical med  Last Refill:   per pharmacy 05/28/2019   Future visit scheduled?  No   Notes to Clinic:  Metoprolol Succ ER 25 mg tab was discontinued by CMA on 05/20/2019 with reason being completed course.    Please review.

## 2019-08-17 NOTE — Telephone Encounter (Signed)
Call patient and inquire about the following: when was this medication stopped, by whom and for what reason. It looks like her cardiologist stopped it on 05/20/2019. I need more information regarding this.  Thanks.

## 2019-08-17 NOTE — Telephone Encounter (Signed)
Called and LVM for patient to return call. 

## 2019-08-17 NOTE — Telephone Encounter (Signed)
Patient states she never knew of the medication being discontinued and must have been in error because she has still been taking this medication and states she is due for a refill.  Please Advise.

## 2019-08-17 NOTE — Telephone Encounter (Signed)
Medication was discontinued before just seeing if it was appropriate to refill   Patient is requesting a refill of the following medications: Requested Prescriptions   Pending Prescriptions Disp Refills  . metoprolol succinate (TOPROL-XL) 25 MG 24 hr tablet [Pharmacy Med Name: METOPROLOL SUCC ER 25 MG TAB] 90 tablet 3    Sig: TAKE 1 TABLET BY MOUTH EVERY DAY    Date of patient request: 08/17/2019 Last office visit: 07/26/2019 Date of last refill:  Last refill amount: Follow up time period per chart: N/A

## 2019-08-17 NOTE — Telephone Encounter (Signed)
Patient called because her PCP office told her that someone from our office discontinued her metoprolol succinate (TOPROL-XL) 25 MG 24 hr tablet.  I advised her I see it on her med list.

## 2019-08-26 ENCOUNTER — Ambulatory Visit
Admission: RE | Admit: 2019-08-26 | Discharge: 2019-08-26 | Disposition: A | Discharge status: Home / Self Care | Payer: BC Managed Care – PPO | Source: Ambulatory Visit | Attending: Otolaryngology | Admitting: Otolaryngology

## 2019-08-26 DIAGNOSIS — H6041 Cholesteatoma of right external ear: Secondary | ICD-10-CM | POA: Diagnosis not present

## 2019-08-26 DIAGNOSIS — H7191 Unspecified cholesteatoma, right ear: Secondary | ICD-10-CM

## 2019-08-30 ENCOUNTER — Other Ambulatory Visit: Payer: Self-pay

## 2019-08-30 ENCOUNTER — Ambulatory Visit: Payer: BC Managed Care – PPO | Admitting: Registered Nurse

## 2019-08-30 ENCOUNTER — Encounter: Payer: Self-pay | Admitting: Registered Nurse

## 2019-08-30 VITALS — BP 133/86 | HR 90 | Temp 97.8°F | Resp 17 | Ht 64.0 in | Wt 180.4 lb

## 2019-08-30 DIAGNOSIS — F411 Generalized anxiety disorder: Secondary | ICD-10-CM | POA: Diagnosis not present

## 2019-08-30 DIAGNOSIS — R202 Paresthesia of skin: Secondary | ICD-10-CM | POA: Diagnosis not present

## 2019-08-30 DIAGNOSIS — M549 Dorsalgia, unspecified: Secondary | ICD-10-CM | POA: Diagnosis not present

## 2019-08-30 LAB — POCT URINALYSIS DIP (CLINITEK)
Bilirubin, UA: NEGATIVE
Glucose, UA: NEGATIVE mg/dL
Ketones, POC UA: NEGATIVE mg/dL
Leukocytes, UA: NEGATIVE
Nitrite, UA: NEGATIVE
POC PROTEIN,UA: NEGATIVE
Spec Grav, UA: 1.015 (ref 1.010–1.025)
Urobilinogen, UA: 0.2 E.U./dL
pH, UA: 6.5 (ref 5.0–8.0)

## 2019-08-30 MED ORDER — LORAZEPAM 0.5 MG PO TABS: ORAL_TABLET | ORAL | 0 refills | Status: DC

## 2019-08-30 NOTE — Patient Instructions (Signed)
° ° ° °  If you have lab work done today you will be contacted with your lab results within the next 2 weeks.  If you have not heard from us then please contact us. The fastest way to get your results is to register for My Chart. ° ° °IF you received an x-ray today, you will receive an invoice from [facility]. Please contact [facility] at [phone] with questions or concerns regarding your invoice.  ° °IF you received labwork today, you will receive an invoice from [facility]. Please contact [facility] at [phone] with questions or concerns regarding your invoice.  ° °Our billing staff will not be able to assist you with questions regarding bills from these companies. ° °You will be contacted with the lab results as soon as they are available. The fastest way to get your results is to activate your My Chart account. Instructions are located on the last page of this paperwork. If you have not heard from us regarding the results in 2 weeks, please contact this office. °  ° ° ° °

## 2019-08-31 ENCOUNTER — Encounter: Payer: Self-pay | Admitting: Registered Nurse

## 2019-08-31 LAB — MAGNESIUM

## 2019-08-31 LAB — VITAMIN D 25 HYDROXY (VIT D DEFICIENCY, FRACTURES): Vit D, 25-Hydroxy: 39 ng/mL (ref 30.0–100.0)

## 2019-08-31 LAB — VITAMIN B12: Vitamin B-12: 721 pg/mL (ref 232–1245)

## 2019-08-31 MED ORDER — DICLOFENAC SODIUM 75 MG PO TBEC: 75.0000 mg | DELAYED_RELEASE_TABLET | Freq: Two times a day (BID) | ORAL | 0 refills | Status: DC

## 2019-08-31 MED ORDER — METHOCARBAMOL 500 MG PO TABS: 500.0000 mg | ORAL_TABLET | Freq: Four times a day (QID) | ORAL | 0 refills | Status: DC

## 2019-08-31 NOTE — Progress Notes (Signed)
Acute Office Visit  Subjective:    Patient ID: Cathy Cole, female    DOB: [birth year], 49 y.o.   MRN: [id number]  Chief Complaint  Patient presents with  . Back Pain    patient states she has been havinfg lower back pain for about one week and she also have muscle spasms in both legs again since 07/26/2019 OV    HPI Patient is in today for back pain and leg pain  Back pain: on and off for about one week. Located in left flank Usually dull but occasional bursts of sharp pain. No hx of renal stone. No urinary symptoms. No midline pain. Does not radiate. Overall tolerable. Has not tried treatment  Leg pain: describes as more of a numbness and tingling, notes that it affects different portions of her legs. No clear patterns. Feels distinct from cramping that she previously saw PCP Dr Mitchel Honour for about 1 mo ago. No lower back injury, no saddle anesthesia. No changes to diet or exercise of note. Mostly in muscles does not feel there to be joint involvement.  Past Medical History:  Diagnosis Date  . Abnormal uterine bleeding (AUB) 11/07/2014  . Allergy   . Anxiety   . Hypertension   . Menorrhagia 11/07/2014    Past Surgical History:  Procedure Laterality Date  . BUNIONECTOMY    . COSMETIC SURGERY    . NOVASURE ABLATION N/A 11/08/2014   Procedure: NOVASURE ABLATION;  Surgeon: Janyth Contes, MD;  Location: [facility];  Service: Gynecology;  Laterality: N/A;  . TUBAL LIGATION      Family History  Problem Relation Age of Onset  . Diabetes Other   . Hypertension Other   . Hypertension Mother   . Diabetes Maternal Grandmother   . Hypertension Maternal Grandmother     Social History   Socioeconomic History  . Marital status: Divorced    Spouse name: Not on file  . Number of children: 3  . Years of education: Not on file  . Highest education level: Not on file  Occupational History  . Not on file  Tobacco Use  . Smoking status: Never Smoker  . Smokeless tobacco:  Never Used  Vaping Use  . Vaping Use: Never used  Substance and Sexual Activity  . Alcohol use: No  . Drug use: Never  . Sexual activity: Yes  Other Topics Concern  . Not on file  Social History Narrative  . Not on file   Social Determinants of Health   Financial Resource Strain:   . Difficulty of Paying Living Expenses:   Food Insecurity:   . Worried About Charity fundraiser in the Last Year:   . Arboriculturist in the Last Year:   Transportation Needs:   . Film/video editor (Medical):   Marland Kitchen Lack of Transportation (Non-Medical):   Physical Activity:   . Days of Exercise per Week:   . Minutes of Exercise per Session:   Stress:   . Feeling of Stress :   Social Connections:   . Frequency of Communication with Friends and Family:   . Frequency of Social Gatherings with Friends and Family:   . Attends Religious Services:   . Active Member of Clubs or Organizations:   . Attends Archivist Meetings:   Marland Kitchen Marital Status:   Intimate Partner Violence:   . Fear of Current or Ex-Partner:   . Emotionally Abused:   Marland Kitchen Physically Abused:   . Sexually Abused:  Outpatient Medications Prior to Visit  Medication Sig Dispense Refill  . acetaminophen (TYLENOL) 500 MG tablet Take 1,000 mg by mouth every 6 (six) hours as needed for mild pain, moderate pain or headache.    . Cholecalciferol (VITAMIN D3) 2000 UNITS TABS Take 2,000 Units by mouth daily.     Marland Kitchen losartan-hydrochlorothiazide (HYZAAR) 50-12.5 MG tablet Take 1 tablet by mouth daily. 90 tablet 3  . metoprolol succinate (TOPROL-XL) 25 MG 24 hr tablet TAKE 1 TABLET BY MOUTH EVERY DAY 90 tablet 3  . LORazepam (ATIVAN) 0.5 MG tablet TAKE 1 TABLET BY MOUTH EVERY DAY AS NEEDED FOR ANXIETY 30 tablet 0  . Metoprolol Succinate 25 MG CS24 Take by mouth daily.     No facility-administered medications prior to visit.    Allergies  Allergen Reactions  . Bactrim [Sulfamethoxazole-Trimethoprim] Anaphylaxis    Chest pain     Review of Systems  Constitutional: Negative.   HENT: Negative.   Eyes: Negative.   Respiratory: Negative.   Cardiovascular: Negative.   Gastrointestinal: Negative.   Endocrine: Negative.   Genitourinary: Negative.   Musculoskeletal: Positive for back pain. Negative for arthralgias, gait problem, joint swelling, myalgias, neck pain and neck stiffness.  Skin: Negative.   Allergic/Immunologic: Negative.   Neurological: Positive for numbness. Negative for dizziness, tremors, seizures, syncope, facial asymmetry, speech difficulty, weakness, light-headedness and headaches.  Hematological: Negative.   Psychiatric/Behavioral: Negative.   All other systems reviewed and are negative.      Objective:    Physical Exam Vitals and nursing note reviewed.  Constitutional:      General: She is not in acute distress.    Appearance: Normal appearance. She is normal weight. She is not ill-appearing, toxic-appearing or diaphoretic.  Cardiovascular:     Rate and Rhythm: Normal rate and regular rhythm.  Abdominal:     Tenderness: There is no right CVA tenderness or left CVA tenderness.  Musculoskeletal:        General: No swelling, tenderness, deformity or signs of injury. Normal range of motion.     Right lower leg: No edema.     Left lower leg: No edema.  Skin:    General: Skin is warm and dry.     Capillary Refill: Capillary refill takes less than 2 seconds.     Coloration: Skin is not jaundiced or pale.     Findings: No bruising, erythema, lesion or rash.  Neurological:     General: No focal deficit present.     Mental Status: She is alert and oriented to person, place, and time. Mental status is at baseline.  Psychiatric:        Mood and Affect: Mood normal.        Behavior: Behavior normal.        Thought Content: Thought content normal.        Judgment: Judgment normal.     BP 133/86   Pulse 90   Temp 97.8 F (36.6 C) (Temporal)   Resp 17   Ht 5\' 4"  (1.626 m)   Wt 180 lb  6.4 oz (81.8 kg)   SpO2 100%   BMI 30.97 kg/m  Wt Readings from Last 3 Encounters:  08/30/19 180 lb 6.4 oz (81.8 kg)  07/26/19 181 lb (82.1 kg)  05/20/19 189 lb 12.8 oz (86.1 kg)    There are no preventive care reminders to display for this patient.  There are no preventive care reminders to display for this patient.   No results found  for: TSH Lab Results  Component Value Date   WBC 12.1 (H) 07/26/2019   HGB 14.2 07/26/2019   HCT 42.3 07/26/2019   MCV 90 07/26/2019   PLT 343 07/26/2019   Lab Results  Component Value Date   NA 141 07/26/2019   K 4.0 07/26/2019   CO2 25 07/26/2019   GLUCOSE 98 07/26/2019   BUN 13 07/26/2019   CREATININE 0.85 07/26/2019   BILITOT 0.6 07/26/2019   ALKPHOS 91 07/26/2019   AST 15 07/26/2019   ALT 19 07/26/2019   PROT 7.7 07/26/2019   ALBUMIN 4.6 07/26/2019   CALCIUM 9.9 07/26/2019   ANIONGAP 11 01/17/2019   Lab Results  Component Value Date   CHOL 151 02/14/2019   Lab Results  Component Value Date   HDL 61 02/14/2019   Lab Results  Component Value Date   LDLCALC 65 02/14/2019   Lab Results  Component Value Date   TRIG 147 02/14/2019   Lab Results  Component Value Date   CHOLHDL 2.5 02/14/2019   Lab Results  Component Value Date   HGBA1C 5.4 07/26/2019       Assessment & Plan:   Problem List Items Addressed This Visit      Other   Generalized anxiety disorder   Relevant Medications   LORazepam (ATIVAN) 0.5 MG tablet    Other Visit Diagnoses    Bilateral back pain, unspecified back location, unspecified chronicity    -  Primary   Relevant Orders   POCT URINALYSIS DIP (CLINITEK) (Completed)   Paresthesia of both lower extremities       Relevant Orders   Vitamin D, 25-hydroxy   Vitamin B12   Magnesium       Meds ordered this encounter  Medications  . LORazepam (ATIVAN) 0.5 MG tablet    Sig: TAKE 1 TABLET BY MOUTH EVERY DAY AS NEEDED FOR ANXIETY    Dispense:  30 tablet    Refill:  0    Not to exceed  5 additional fills before 08/13/2019   PLAN  Concern for vit d, b12, or magnesium deficiency based on patient description of sensation. Also consider dehydration or deconditioning.   Back pain may represent early renal stone? However, no CVA tenderness. POCT UA dip negative for all except small RBC. Not likely UTI. Monitor symptoms and return if worsening, failing to improve, or any significant developments.   Refill alprazolam per pt request  Patient encouraged to call clinic with any questions, comments, or concerns.   Janeece Agee, NP

## 2019-09-05 ENCOUNTER — Encounter: Payer: Self-pay | Admitting: Registered Nurse

## 2019-09-09 DIAGNOSIS — H918X2 Other specified hearing loss, left ear: Secondary | ICD-10-CM | POA: Diagnosis not present

## 2019-09-09 DIAGNOSIS — H90A11 Conductive hearing loss, unilateral, right ear with restricted hearing on the contralateral side: Secondary | ICD-10-CM | POA: Diagnosis not present

## 2019-09-29 DIAGNOSIS — Z01818 Encounter for other preprocedural examination: Secondary | ICD-10-CM | POA: Diagnosis not present

## 2019-10-03 ENCOUNTER — Other Ambulatory Visit: Payer: Self-pay | Admitting: Otolaryngology

## 2019-10-03 DIAGNOSIS — H900 Conductive hearing loss, bilateral: Secondary | ICD-10-CM | POA: Diagnosis not present

## 2019-10-03 DIAGNOSIS — H7121 Cholesteatoma of mastoid, right ear: Secondary | ICD-10-CM | POA: Diagnosis not present

## 2019-10-03 DIAGNOSIS — H7111 Cholesteatoma of tympanum, right ear: Secondary | ICD-10-CM | POA: Diagnosis not present

## 2019-10-03 DIAGNOSIS — H6983 Other specified disorders of Eustachian tube, bilateral: Secondary | ICD-10-CM | POA: Diagnosis not present

## 2019-10-03 DIAGNOSIS — H73892 Other specified disorders of tympanic membrane, left ear: Secondary | ICD-10-CM | POA: Diagnosis not present

## 2019-11-01 IMAGING — CR DG CHEST 2V
2 series · 2 of 2 positions shown · non-contrast
Comparison: 07/01/2014

CLINICAL DATA: Chest pain and shortness of breath.

EXAM:
CHEST - 2 VIEW

[w chest pa]
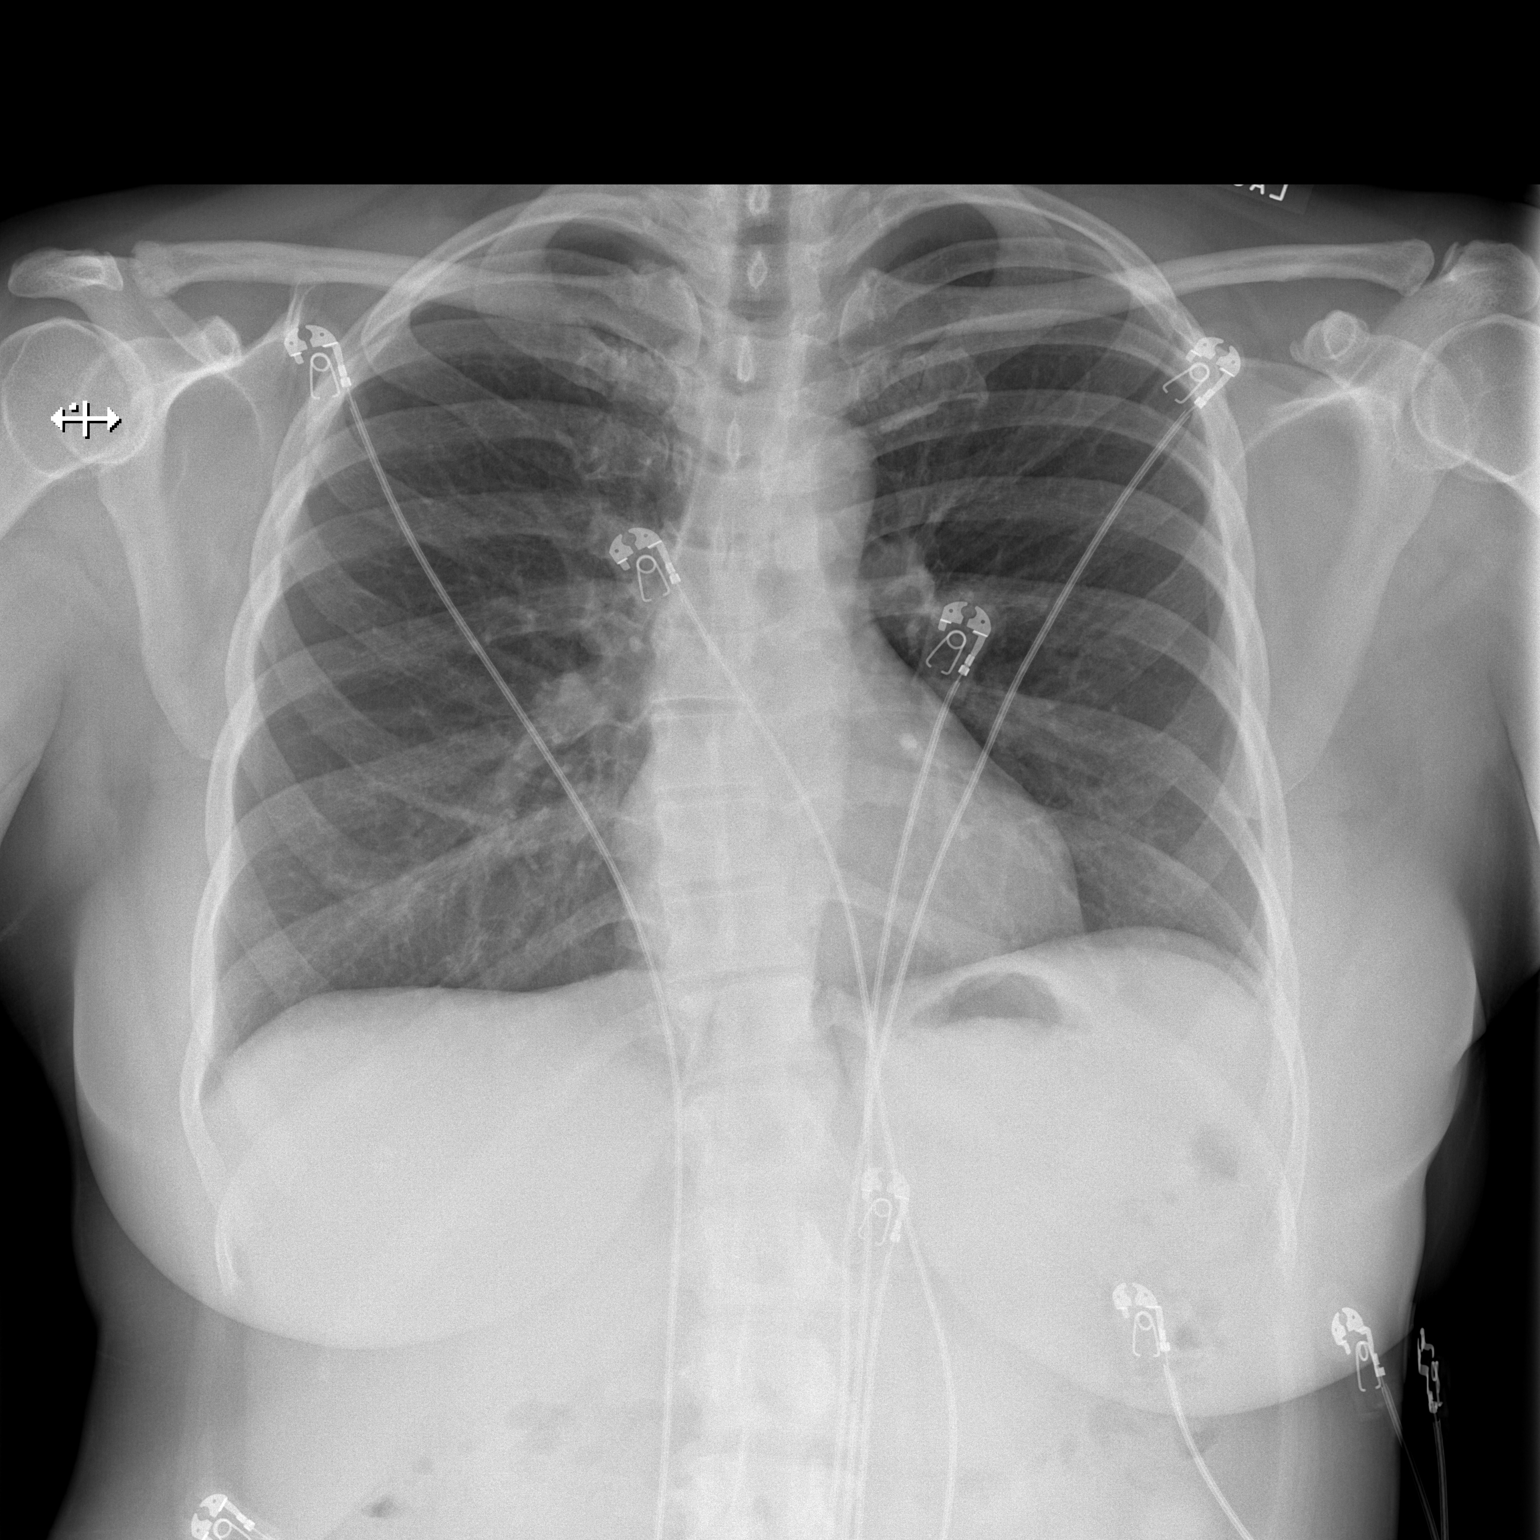

[w chest lat]
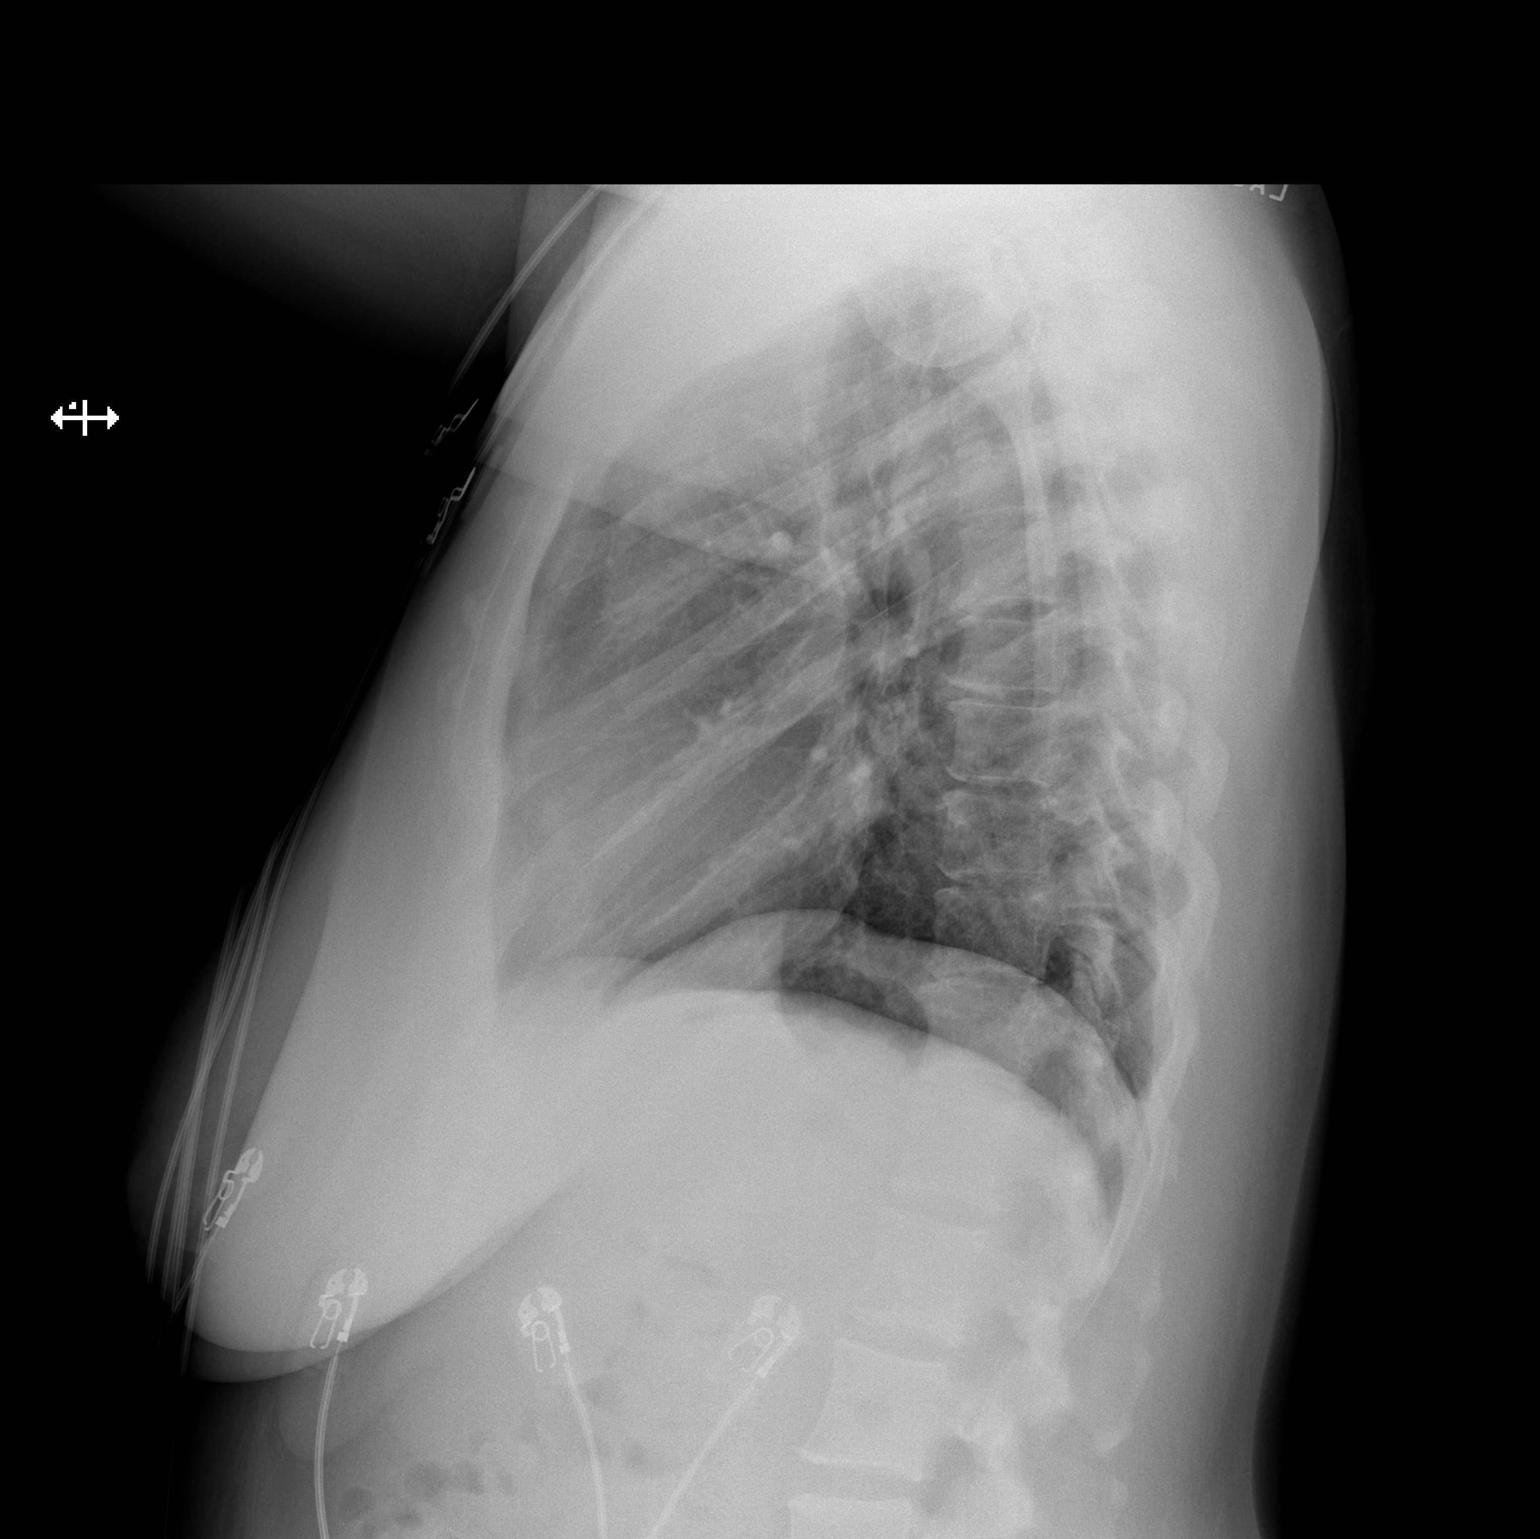

[2 of 2 positions shown; findings below may reference images not displayed]

FINDINGS: The cardiomediastinal contours are normal. The lungs are clear.
Pulmonary vasculature is normal. No consolidation, pleural effusion,
or pneumothorax. No acute osseous abnormalities are seen.
IMPRESSION: Negative radiographs of the chest.

## 2019-11-16 ENCOUNTER — Other Ambulatory Visit: Payer: Self-pay | Admitting: Registered Nurse

## 2019-11-16 DIAGNOSIS — F411 Generalized anxiety disorder: Secondary | ICD-10-CM

## 2019-11-16 NOTE — Telephone Encounter (Signed)
Patient is requesting a refill of the following medications: Requested Prescriptions   Pending Prescriptions Disp Refills   LORazepam (ATIVAN) 0.5 MG tablet [Pharmacy Med Name: LORAZEPAM 0.5 MG TABLET] 30 tablet 0    Sig: TAKE 1 TABLET BY MOUTH EVERY DAY AS NEEDED FOR ANXIETY    Date of patient request: 11/16/19 Last office visit: 08/30/19 Date of last refill: 08/31/19 Last refill amount: 30 Follow up time period per chart: none

## 2019-11-22 ENCOUNTER — Ambulatory Visit: Payer: Self-pay

## 2019-11-25 ENCOUNTER — Ambulatory Visit
Admission: RE | Admit: 2019-11-25 | Discharge: 2019-11-25 | Disposition: A | Discharge status: Home / Self Care | Payer: BC Managed Care – PPO | Source: Ambulatory Visit | Attending: Emergency Medicine | Admitting: Emergency Medicine

## 2019-11-25 ENCOUNTER — Other Ambulatory Visit: Payer: Self-pay

## 2019-11-25 VITALS — BP 150/89 | HR 88 | Temp 98.6°F | Resp 18

## 2019-11-25 DIAGNOSIS — Z1152 Encounter for screening for COVID-19: Secondary | ICD-10-CM | POA: Diagnosis not present

## 2019-11-25 NOTE — ED Triage Notes (Signed)
Pt states her daughter tested covid positive on Saturday. States has a runny nose and headache. Pt requesting testing only.

## 2019-11-25 NOTE — Discharge Instructions (Signed)

## 2019-11-28 LAB — NOVEL CORONAVIRUS, NAA: SARS-CoV-2, NAA: NOT DETECTED

## 2019-12-19 ENCOUNTER — Other Ambulatory Visit: Payer: Self-pay | Admitting: Registered Nurse

## 2019-12-19 DIAGNOSIS — F411 Generalized anxiety disorder: Secondary | ICD-10-CM

## 2019-12-20 NOTE — Telephone Encounter (Signed)
Requested medication (s) are due for refill today: yes  Requested medication (s) are on the active medication list: yes  Last refill:  11/16/19  Future visit scheduled: no  Notes to clinic:  med not delegated to NT to RF   Requested Prescriptions  Pending Prescriptions Disp Refills   LORazepam (ATIVAN) 0.5 MG tablet [Pharmacy Med Name: LORAZEPAM 0.5 MG TABLET] 30 tablet 0    Sig: TAKE 1 TABLET BY MOUTH EVERY DAY AS NEEDED FOR ANXIETY      Not Delegated - Psychiatry:  Anxiolytics/Hypnotics Failed - 12/19/2019 11:59 PM      Failed - This refill cannot be delegated      Failed - Urine Drug Screen completed in last 360 days.      Passed - Valid encounter within last 6 months    Recent Outpatient Visits           3 months ago Bilateral back pain, unspecified back location, unspecified chronicity   [facility] at [facility], Gerlene Burdock, NP   4 months ago Muscle cramp   Primary Care at [facility], Eilleen Kempf, MD   10 months ago Routine general medical examination at a health care facility   Primary Care at [facility], Eilleen Kempf, MD   11 months ago Essential hypertension   Primary Care at [facility], Eilleen Kempf, MD   1 year ago Acute costochondritis   Primary Care at [facility], Eilleen Kempf, MD

## 2019-12-20 NOTE — Telephone Encounter (Signed)
Patient is requesting a refill of the following medications: Requested Prescriptions   Pending Prescriptions Disp Refills   LORazepam (ATIVAN) 0.5 MG tablet [Pharmacy Med Name: LORAZEPAM 0.5 MG TABLET] 30 tablet 0    Sig: TAKE 1 TABLET BY MOUTH EVERY DAY AS NEEDED FOR ANXIETY    Date of patient request: 12/20/2019 Last office visit: 08/30/2019 Date of last refill: 11/16/2019 Last refill amount: 30tab

## 2020-01-14 ENCOUNTER — Ambulatory Visit: Payer: Self-pay

## 2020-01-14 ENCOUNTER — Ambulatory Visit
Admission: EM | Admit: 2020-01-14 | Discharge: 2020-01-14 | Disposition: A | Discharge status: Home / Self Care | Payer: BC Managed Care – PPO | Attending: Physician Assistant | Admitting: Physician Assistant

## 2020-01-14 ENCOUNTER — Other Ambulatory Visit: Payer: Self-pay

## 2020-01-14 DIAGNOSIS — Z1152 Encounter for screening for COVID-19: Secondary | ICD-10-CM

## 2020-01-14 NOTE — ED Triage Notes (Signed)
Pt states her mother tested positive for covid on Thursday and is asymptomatic but would like to be tested. Pt is aox4 and ambulatory.

## 2020-01-15 LAB — NOVEL CORONAVIRUS, NAA: SARS-CoV-2, NAA: NOT DETECTED

## 2020-01-15 LAB — SARS-COV-2, NAA 2 DAY TAT

## 2020-01-20 ENCOUNTER — Encounter: Payer: BC Managed Care – PPO | Admitting: Family Medicine

## 2020-01-20 ENCOUNTER — Telehealth: Payer: Self-pay | Admitting: Family Medicine

## 2020-01-20 DIAGNOSIS — Z0000 Encounter for general adult medical examination without abnormal findings: Secondary | ICD-10-CM

## 2020-01-20 DIAGNOSIS — H90A11 Conductive hearing loss, unilateral, right ear with restricted hearing on the contralateral side: Secondary | ICD-10-CM | POA: Diagnosis not present

## 2020-01-20 DIAGNOSIS — H6992 Unspecified Eustachian tube disorder, left ear: Secondary | ICD-10-CM | POA: Insufficient documentation

## 2020-01-20 DIAGNOSIS — H6982 Other specified disorders of Eustachian tube, left ear: Secondary | ICD-10-CM | POA: Insufficient documentation

## 2020-01-20 DIAGNOSIS — H6983 Other specified disorders of Eustachian tube, bilateral: Secondary | ICD-10-CM | POA: Diagnosis not present

## 2020-01-20 DIAGNOSIS — Z9622 Myringotomy tube(s) status: Secondary | ICD-10-CM | POA: Diagnosis not present

## 2020-01-20 DIAGNOSIS — H90A31 Mixed conductive and sensorineural hearing loss, unilateral, right ear with restricted hearing on the contralateral side: Secondary | ICD-10-CM | POA: Diagnosis not present

## 2020-01-20 DIAGNOSIS — H7191 Unspecified cholesteatoma, right ear: Secondary | ICD-10-CM | POA: Diagnosis not present

## 2020-01-20 DIAGNOSIS — H9192 Unspecified hearing loss, left ear: Secondary | ICD-10-CM | POA: Diagnosis not present

## 2020-01-20 NOTE — Telephone Encounter (Signed)
Pt is having her cpe on 02/24/20 and her lab visit on 02/20/20. Please put lab orders in for the 6th.

## 2020-01-29 ENCOUNTER — Other Ambulatory Visit: Payer: Self-pay | Admitting: Registered Nurse

## 2020-01-29 DIAGNOSIS — F411 Generalized anxiety disorder: Secondary | ICD-10-CM

## 2020-01-29 NOTE — Telephone Encounter (Signed)
Requested medication (s) are due for refill today: yes  Requested medication (s) are on the active medication list: yes  Last refill:  12/20/19  Future visit scheduled: yes  Notes to clinic:  med not delegated to NT to RF   Requested Prescriptions  Pending Prescriptions Disp Refills   LORazepam (ATIVAN) 0.5 MG tablet [Pharmacy Med Name: LORAZEPAM 0.5 MG TABLET] 30 tablet 0    Sig: TAKE 1 TABLET BY MOUTH EVERY DAY AS NEEDED FOR ANXIETY      Not Delegated - Psychiatry:  Anxiolytics/Hypnotics Failed - 01/29/2020 12:39 PM      Failed - This refill cannot be delegated      Failed - Urine Drug Screen completed in last 360 days      Passed - Valid encounter within last 6 months    Recent Outpatient Visits           5 months ago Bilateral back pain, unspecified back location, unspecified chronicity   [facility] at [facility], Gerlene Burdock, NP   6 months ago Muscle cramp   Primary Care at [facility], Eilleen Kempf, MD   11 months ago Routine general medical examination at a health care facility   Primary Care at [place], Eilleen Kempf, MD   1 year ago Essential hypertension   Primary Care at [place], Eilleen Kempf, MD   1 year ago Acute costochondritis   Primary Care at [facility], Eilleen Kempf, MD       Future Appointments             In 3 weeks Alwyn Ren, Sandria Bales, MD Primary Care at [place], [facility]

## 2020-01-30 ENCOUNTER — Ambulatory Visit: Payer: BC Managed Care – PPO | Admitting: Emergency Medicine

## 2020-01-31 ENCOUNTER — Telehealth: Payer: Self-pay | Admitting: Emergency Medicine

## 2020-01-31 DIAGNOSIS — F411 Generalized anxiety disorder: Secondary | ICD-10-CM

## 2020-01-31 NOTE — Telephone Encounter (Signed)
Medication Refill - Medication: LORazepam (ATIVAN) 0.5 MG tablet    Has the patient contacted their pharmacy? Yes.   (Agent: If no, request that the patient contact the pharmacy for the refill.) (Agent: If yes, when and what did the pharmacy advise?)  Preferred Pharmacy (with phone number or street name):  [id number] [place], [place] - [address]  [address] [place] [address]  Phone: [phone] Fax: [phone]     Agent: Please be advised that RX refills may take up to 3 business days. We ask that you follow-up with your pharmacy.

## 2020-01-31 NOTE — Telephone Encounter (Signed)
Requested medication (s) are due for refill today: no  Requested medication (s) are on the active medication list: yes  Last refill: 12/20/2019  Future visit scheduled: yes  Notes to clinic: 2nd request  Review for refill   Requested Prescriptions  Pending Prescriptions Disp Refills   LORazepam (ATIVAN) 0.5 MG tablet 30 tablet 0      Not Delegated - Psychiatry:  Anxiolytics/Hypnotics Failed - 01/31/2020  2:05 PM      Failed - This refill cannot be delegated      Failed - Urine Drug Screen completed in last 360 days      Passed - Valid encounter within last 6 months    Recent Outpatient Visits           5 months ago Bilateral back pain, unspecified back location, unspecified chronicity   [facility] at [facility], Gerlene Burdock, NP   6 months ago Muscle cramp   Primary Care at [facility], Eilleen Kempf, MD   11 months ago Routine general medical examination at a health care facility   Primary Care at [place], Eilleen Kempf, MD   1 year ago Essential hypertension   Primary Care at [place], Eilleen Kempf, MD   1 year ago Acute costochondritis   Primary Care at [facility], Eilleen Kempf, MD       Future Appointments             In 3 weeks Alwyn Ren, Sandria Bales, MD Primary Care at [place], [facility]

## 2020-01-31 NOTE — Telephone Encounter (Signed)
Patient is requesting a refill of the following medications: Requested Prescriptions   Pending Prescriptions Disp Refills   LORazepam (ATIVAN) 0.5 MG tablet [Pharmacy Med Name: LORAZEPAM 0.5 MG TABLET] 30 tablet 0    Sig: TAKE 1 TABLET BY MOUTH EVERY DAY AS NEEDED FOR ANXIETY    Date of patient request: 01/31/2020 Last office visit: 08/30/2019 Date of last refill: 12/20/2019 Last refill amount: 30

## 2020-02-20 ENCOUNTER — Other Ambulatory Visit: Payer: Self-pay

## 2020-02-20 ENCOUNTER — Ambulatory Visit (INDEPENDENT_AMBULATORY_CARE_PROVIDER_SITE_OTHER): Payer: BC Managed Care – PPO | Admitting: Emergency Medicine

## 2020-02-20 DIAGNOSIS — Z0000 Encounter for general adult medical examination without abnormal findings: Secondary | ICD-10-CM

## 2020-02-20 LAB — COMPREHENSIVE METABOLIC PANEL
ALT: 14 IU/L (ref 0–32)
AST: 12 IU/L (ref 0–40)
Albumin/Globulin Ratio: 1.3 (ref 1.2–2.2)
Albumin: 4.3 g/dL (ref 3.8–4.8)
Alkaline Phosphatase: 94 IU/L (ref 44–121)
BUN/Creatinine Ratio: 18 (ref 9–23)
BUN: 17 mg/dL (ref 6–24)
Bilirubin Total: 0.4 mg/dL (ref 0.0–1.2)
CO2: 24 mmol/L (ref 20–29)
Calcium: 9.7 mg/dL (ref 8.7–10.2)
Chloride: 100 mmol/L (ref 96–106)
Creatinine, Ser: 0.94 mg/dL (ref 0.57–1.00)
GFR calc Af Amer: 83 mL/min/{1.73_m2} (ref >59)
GFR calc non Af Amer: 72 mL/min/{1.73_m2} (ref >59)
Globulin, Total: 3.3 g/dL (ref 1.5–4.5)
Glucose: 89 mg/dL (ref 65–99)
Potassium: 3.7 mmol/L (ref 3.5–5.2)
Sodium: 139 mmol/L (ref 134–144)
Total Protein: 7.6 g/dL (ref 6.0–8.5)

## 2020-02-20 LAB — CBC WITH DIFFERENTIAL/PLATELET
Basophils Absolute: 0 10*3/uL (ref 0.0–0.2)
Basos: 0 %
EOS (ABSOLUTE): 0.4 10*3/uL (ref 0.0–0.4)
Eos: 3 %
Hematocrit: 41.5 % (ref 34.0–46.6)
Hemoglobin: 14 g/dL (ref 11.1–15.9)
Immature Grans (Abs): 0.1 10*3/uL (ref 0.0–0.1)
Immature Granulocytes: 1 %
Lymphocytes Absolute: 4.1 10*3/uL — ABNORMAL HIGH (ref 0.7–3.1)
Lymphs: 37 %
MCH: 29.9 pg (ref 26.6–33.0)
MCHC: 33.7 g/dL (ref 31.5–35.7)
MCV: 89 fL (ref 79–97)
Monocytes Absolute: 0.8 10*3/uL (ref 0.1–0.9)
Monocytes: 7 %
Neutrophils Absolute: 5.7 10*3/uL (ref 1.4–7.0)
Neutrophils: 52 %
Platelets: 376 10*3/uL (ref 150–450)
RBC: 4.69 x10E6/uL (ref 3.77–5.28)
RDW: 13.1 % (ref 11.7–15.4)
WBC: 11 10*3/uL — ABNORMAL HIGH (ref 3.4–10.8)

## 2020-02-20 LAB — LIPID PANEL
Chol/HDL Ratio: 2.2 ratio (ref 0.0–4.4)
Cholesterol, Total: 140 mg/dL (ref 100–199)
HDL: 63 mg/dL (ref >39)
LDL Chol Calc (NIH): 59 mg/dL (ref 0–99)
Triglycerides: 97 mg/dL (ref 0–149)
VLDL Cholesterol Cal: 18 mg/dL (ref 5–40)

## 2020-02-20 LAB — HEMOGLOBIN A1C
Est. average glucose Bld gHb Est-mCnc: 114 mg/dL
Hgb A1c MFr Bld: 5.6 % (ref 4.8–5.6)

## 2020-02-23 ENCOUNTER — Encounter: Payer: BC Managed Care – PPO | Admitting: Family Medicine

## 2020-02-24 ENCOUNTER — Ambulatory Visit (INDEPENDENT_AMBULATORY_CARE_PROVIDER_SITE_OTHER): Payer: BC Managed Care – PPO | Admitting: Family Medicine

## 2020-02-24 ENCOUNTER — Other Ambulatory Visit: Payer: Self-pay

## 2020-02-24 ENCOUNTER — Encounter: Payer: Self-pay | Admitting: Family Medicine

## 2020-02-24 VITALS — BP 138/82 | HR 93 | Temp 98.4°F | Ht 64.0 in | Wt 185.2 lb

## 2020-02-24 DIAGNOSIS — E559 Vitamin D deficiency, unspecified: Secondary | ICD-10-CM

## 2020-02-24 DIAGNOSIS — B9689 Other specified bacterial agents as the cause of diseases classified elsewhere: Secondary | ICD-10-CM

## 2020-02-24 DIAGNOSIS — Z0001 Encounter for general adult medical examination with abnormal findings: Secondary | ICD-10-CM | POA: Diagnosis not present

## 2020-02-24 DIAGNOSIS — F411 Generalized anxiety disorder: Secondary | ICD-10-CM

## 2020-02-24 DIAGNOSIS — N898 Other specified noninflammatory disorders of vagina: Secondary | ICD-10-CM

## 2020-02-24 DIAGNOSIS — Z0000 Encounter for general adult medical examination without abnormal findings: Secondary | ICD-10-CM

## 2020-02-24 DIAGNOSIS — N760 Acute vaginitis: Secondary | ICD-10-CM

## 2020-02-24 DIAGNOSIS — I10 Essential (primary) hypertension: Secondary | ICD-10-CM

## 2020-02-24 DIAGNOSIS — Z8669 Personal history of other diseases of the nervous system and sense organs: Secondary | ICD-10-CM

## 2020-02-24 LAB — POCT WET + KOH PREP
Trich by wet prep: ABSENT
Yeast by KOH: ABSENT
Yeast by wet prep: ABSENT

## 2020-02-24 MED ORDER — METRONIDAZOLE 0.75 % VA GEL: 1.0000 | Freq: Two times a day (BID) | VAGINAL | 2 refills | Status: DC

## 2020-02-24 NOTE — Patient Instructions (Addendum)
You were here for an annual physical examination.  I have reviewed all of your laboratory tests and they look excellent.  This includes your blood chemistries which are the sugar, kidney function, liver enzymes, blood protein levels, and a few other things.  Your hemoglobin and white blood count are good.  Actually you always run a borderline high white blood count for the last several years, with normal up to 10, yours this time was 11,000 and it usually runs between 11 and 13,000 for you.  I think that is just your baseline.  It is good for you to know that in case someone checks this number when you are sick and thinks it is elevated because of the infection.  Your cholesterol level is good as are the other lipid tests.I see nothing to be concerned about on your labs.  Earlier this year he also had a B12 level and vitamin D level which were fine.  Based on current data you should get your first colonoscopy sometime after age 60 and I would recommend that you do this.  Wet prep confirms the BV.  I have prescribed the gel for you and I gave you a couple of refills because you seem to get the same kind of symptoms recurrently.  Recommend you get your Covid booster  Monitor your blood pressures.  Return annually or as needed or per Dr. Latrelle Dodrill directions.       If you have lab work done today you will be contacted with your lab results within the next 2 weeks.  If you have not heard from [place] then please contact us. The fastest way to get your results is to register for My Chart.   IF you received an x-ray today, you will receive an invoice from [facility]. Please contact [facility] at [phone] with questions or concerns regarding your invoice.   IF you received labwork today, you will receive an invoice from [facility]. Please contact [facility] at [phone] with questions or concerns regarding your invoice.   Our billing staff will not be able to assist you with  questions regarding bills from these companies.  You will be contacted with the lab results as soon as they are available. The fastest way to get your results is to activate your My Chart account. Instructions are located on the last page of this paperwork. If you have not heard from [place] regarding the results in 2 weeks, please contact this office.

## 2020-02-24 NOTE — Progress Notes (Signed)
Acute Office Visit  Subjective:    Patient ID: Cathy Cole, female    DOB: 05/20/1970, 49 y.o.   MRN: [id number]  Chief Complaint  Patient presents with  . Annual Exam    CPE  . Possible BV    Thick discharge and slight odor going on a week     HPI Patient is in today for annual physical examination: Patient ID: Cathy Cole, female    DOB: 1970/04/18  Age: 49 y.o. MRN: [id number]  Chief Complaint  Patient presents with  . Annual Exam    CPE  . Possible BV    Thick discharge and slight odor going on a week     Subjective:   Patient is here for her annual physical examination.  She has no major acute complaints.  She has been having a vaginal discharge that she wants to make sure is checked.  She wants to know the her labs have all been done.  Past medical history: Operations: Bunionectomy on both feet Bilateral tubal ligation in 2003 Tummy tuck 2017 Cholesteatoma right ear in the last year or so Menstrual cycle: None since early this year.  Has not been sexually involved for a year.  Formerly used condoms for a time and then no protection. Medication allergies: Sulfa Regular medications: Lorazepam 0.5 daily as needed losartan hydrochlorothiazide 50/12.51 daily Metoprolol 25 mg daily Cholecalciferol 50 mcg daily  Family history: Father 35 living and well. mother 74 has hypertension. 1 brother living and well. there is a family history of diabetes on the maternal side.  Social history: Patient is divorced for a number of years, has 3 kids and 3 grandkids.  Her youngest child is still in college.  She works at [employer].  Mostly works at [employer].  She works Sunday to Wednesday.  Most of the rest the time is just stuff around the house.  She does not get a lot of regular exercise though she does have a gym membership.  She intermittently goes to church but not much of a regular active spiritual life.  No current boyfriend.  Review of systems: Constitutional:  Generally does well HEENT: Had the cholesteatoma repair and that is doing well.  She has a tube in her other ear.  Cardiovascular: Unremarkable Respiratory: Unremarkable GI: Unremarkable GU: A vaginal discharge sometimes some odor some thicker white some thinner.  History of BV. Musculoskeletal: Unremarkable Neurologic: Unremarkable.  Headaches about once a month Dermatologic: Unremarkable Psychiatric: History of anxiety Endocrinologic: Unremarkable  Current allergies, medications, problem list, past/family and social histories reviewed.  Objective:  BP 138/82   Pulse 93   Temp 98.4 F (36.9 C) (Temporal)   Ht 5\' 4"  (1.626 m)   Wt 185 lb 3.2 oz (84 kg)   SpO2 98%   BMI 31.79 kg/m   Pleasant lady, alert and oriented, no acute distress.  TMs normal.  Eyes PERRL.  Throat clear.  Dentition is fair.  Needs a dental appointment apparently.  Neck supple without nodes or thyromegaly.  No carotid bruits.  Chest clear to auscultation.  Heart rate without murmurs.  Preferred for me not to do a breast exam today.  Abdomen has a transverse scar from her tummy tuck many years ago.  The umbilicus is a reconstructed umbilicus.  Abdomen soft without masses or tenderness.  Normal female external genitalia.  Labia appear normal.  Vaginal is a little whitish stringy and thick mucus.  She has a deep vagina.  Bimanual  exam reveals the uterus to be low positioned toward the left.  No adnexal or uterine masses could be appreciated.  Take extremities unremarkable.  Skin normal. Assessment & Plan:   Assessment: 1. Annual physical exam   2. Generalized anxiety disorder   3. Vitamin D deficiency   4. Vaginal discharge   5. Essential hypertension   6. History of cholesteatoma       Plan: Wet prep  Orders Placed This Encounter  Procedures  . POCT Wet + KOH Prep    No orders of the defined types were placed in this encounter.        Patient Instructions   You were here for an annual  physical examination.  I have reviewed all of your laboratory tests and they look excellent.  This includes your blood chemistries which are the sugar, kidney function, liver enzymes, blood protein levels, and a few other things.  Your hemoglobin and white blood count are good.  Actually you always run a borderline high white blood count for the last several years, with normal up to 10, yours this time was 11,000 and it usually runs between 11 and 13,000 for you.  I think that is just your baseline.  It is good for you to know that in case someone checks this number when you are sick and thinks it is elevated because of the infection.  Your cholesterol level is good as are the other lipid tests.I see nothing to be concerned about on your labs.  Earlier this year he also had a B12 level and vitamin D level which were fine.  Based on current data you should get your first colonoscopy sometime after age 31 and I would recommend that you do this.  Recommend you get your Covid booster       If you have lab work done today you will be contacted with your lab results within the next 2 weeks.  If you have not heard from [place] then please contact us. The fastest way to get your results is to register for My Chart.   IF you received an x-ray today, you will receive an invoice from [facility]. Please contact [facility] at [phone] with questions or concerns regarding your invoice.   IF you received labwork today, you will receive an invoice from [facility]. Please contact [facility] at [phone] with questions or concerns regarding your invoice.   Our billing staff will not be able to assist you with questions regarding bills from these companies.  You will be contacted with the lab results as soon as they are available. The fastest way to get your results is to activate your My Chart account. Instructions are located on the last page of this paperwork. If you have not heard from [place]  regarding the results in 2 weeks, please contact this office.           Return in about 1 year (around 02/23/2021), or if symptoms worsen or fail to improve.   Janace Hoard, MD 02/24/2020

## 2020-03-12 ENCOUNTER — Telehealth: Payer: Self-pay | Admitting: Emergency Medicine

## 2020-03-12 NOTE — Telephone Encounter (Signed)
Patient called to speak with the nurse. Shoulder pains on right side since Wednesday. Pain comes and goes. Patient scheduled for office visit with Kateri Plummer on Wednesday.

## 2020-03-14 ENCOUNTER — Other Ambulatory Visit: Payer: Self-pay

## 2020-03-14 ENCOUNTER — Ambulatory Visit (INDEPENDENT_AMBULATORY_CARE_PROVIDER_SITE_OTHER): Payer: BC Managed Care – PPO | Admitting: Registered Nurse

## 2020-03-14 ENCOUNTER — Encounter: Payer: Self-pay | Admitting: Registered Nurse

## 2020-03-14 VITALS — BP 137/87 | HR 85 | Temp 97.8°F | Ht 64.0 in | Wt 188.0 lb

## 2020-03-14 DIAGNOSIS — S46811A Strain of other muscles, fascia and tendons at shoulder and upper arm level, right arm, initial encounter: Secondary | ICD-10-CM

## 2020-03-14 MED ORDER — METHOCARBAMOL 500 MG PO TABS: 500.0000 mg | ORAL_TABLET | Freq: Four times a day (QID) | ORAL | 0 refills | Status: DC

## 2020-03-14 MED ORDER — TRAMADOL HCL 50 MG PO TABS: 50.0000 mg | ORAL_TABLET | Freq: Two times a day (BID) | ORAL | 0 refills | Status: AC | PRN

## 2020-03-14 NOTE — Patient Instructions (Signed)
° ° ° °  If you have lab work done today you will be contacted with your lab results within the next 2 weeks.  If you have not heard from us then please contact us. The fastest way to get your results is to register for My Chart. ° ° °IF you received an x-ray today, you will receive an invoice from [facility]. Please contact [facility] at [phone] with questions or concerns regarding your invoice.  ° °IF you received labwork today, you will receive an invoice from [facility]. Please contact [facility] at [phone] with questions or concerns regarding your invoice.  ° °Our billing staff will not be able to assist you with questions regarding bills from these companies. ° °You will be contacted with the lab results as soon as they are available. The fastest way to get your results is to activate your My Chart account. Instructions are located on the last page of this paperwork. If you have not heard from us regarding the results in 2 weeks, please contact this office. °  ° ° ° °

## 2020-03-14 NOTE — Progress Notes (Signed)
Acute Office Visit  Subjective:    Patient ID: Cathy Cole, female    DOB: [birth year], 49 y.o.   MRN: [id number]  Chief Complaint  Patient presents with   Shoulder Pain    Pt reports pain in her R should starting on Wednesday. PT reports no known trigger for this pain. Pt reports pain has radiated into the back of the R shoulder and over to the back of the L shoulder at times. Pt is currently in pain with a pain level of 5/10. Pain is achey.    HPI Patient is in today for R shoulder pain Onset one week ago Gradual onset over 2-3 days Now steady, no improvement despite rest and OTCs R hand dominant, often uses at work, often picking up her grandchild. Pain is 4-5/10. Aching. Full ROM. Mostly posterior. No peripheral symptoms. Some aching towards spine as well. No hx of injury to this shoulder. No L shoulder pain. No headaches. No new exercises or other routines. Notes that she does sleep on this shoulder - does not think that this has helped.  Past Medical History:  Diagnosis Date   Abnormal uterine bleeding (AUB) 11/07/2014   Allergy    Anxiety    Hypertension    Menorrhagia 11/07/2014    Past Surgical History:  Procedure Laterality Date   BUNIONECTOMY     COSMETIC SURGERY     NOVASURE ABLATION N/A 11/08/2014   Procedure: NOVASURE ABLATION;  Surgeon: Sherian Rein, MD;  Location: [facility];  Service: Gynecology;  Laterality: N/A;   TUBAL LIGATION      Family History  Problem Relation Age of Onset   Diabetes Other    Hypertension Other    Hypertension Mother    Diabetes Maternal Grandmother    Hypertension Maternal Grandmother     Social History   Socioeconomic History   Marital status: Divorced    Spouse name: Not on file   Number of children: 3   Years of education: Not on file   Highest education level: Not on file  Occupational History   Not on file  Tobacco Use   Smoking status: Never Smoker   Smokeless tobacco: Never Used   Vaping Use   Vaping Use: Never used  Substance and Sexual Activity   Alcohol use: No   Drug use: Never   Sexual activity: Yes  Other Topics Concern   Not on file  Social History Narrative   Not on file   Social Determinants of Health   Financial Resource Strain: Not on file  Food Insecurity: Not on file  Transportation Needs: Not on file  Physical Activity: Not on file  Stress: Not on file  Social Connections: Not on file  Intimate Partner Violence: Not on file    Outpatient Medications Prior to Visit  Medication Sig Dispense Refill   acetaminophen (TYLENOL) 500 MG tablet Take 1,000 mg by mouth every 6 (six) hours as needed for mild pain, moderate pain or headache.     Cholecalciferol (VITAMIN D3) 2000 UNITS TABS Take 2,000 Units by mouth daily.      LORazepam (ATIVAN) 0.5 MG tablet TAKE 1 TABLET BY MOUTH EVERY DAY AS NEEDED FOR ANXIETY 30 tablet 0   losartan-hydrochlorothiazide (HYZAAR) 50-12.5 MG tablet Take 1 tablet by mouth daily. 90 tablet 3   metoprolol succinate (TOPROL-XL) 25 MG 24 hr tablet TAKE 1 TABLET BY MOUTH EVERY DAY 90 tablet 3   metroNIDAZOLE (METROGEL VAGINAL) 0.75 % vaginal gel Place 1 Applicatorful  vaginally 2 (two) times daily. 70 g 2   No facility-administered medications prior to visit.    Allergies  Allergen Reactions   Bactrim [Sulfamethoxazole-Trimethoprim] Anaphylaxis    Chest pain   Other Anaphylaxis    Review of Systems Per hpi      Objective:    Physical Exam Vitals and nursing note reviewed.  Constitutional:      General: She is not in acute distress.    Appearance: Normal appearance. She is not ill-appearing, toxic-appearing or diaphoretic.  Cardiovascular:     Rate and Rhythm: Normal rate and regular rhythm.     Pulses: Normal pulses.     Heart sounds: Normal heart sounds. No murmur heard. No friction rub. No gallop.   Pulmonary:     Effort: Pulmonary effort is normal. No respiratory distress.     Breath  sounds: Normal breath sounds. No stridor. No wheezing, rhonchi or rales.  Chest:     Chest wall: No tenderness.  Musculoskeletal:        General: Tenderness (R trapeziu) present. No swelling, deformity or signs of injury. Normal range of motion.     Right lower leg: No edema.     Left lower leg: No edema.     Comments: Full active ROM. No pain Pain with resistance to pectoral flexion, external rotation of shoulders  Skin:    General: Skin is warm and dry.     Capillary Refill: Capillary refill takes less than 2 seconds.  Neurological:     General: No focal deficit present.     Mental Status: She is alert and oriented to person, place, and time. Mental status is at baseline.  Psychiatric:        Mood and Affect: Mood normal.        Behavior: Behavior normal.        Thought Content: Thought content normal.        Judgment: Judgment normal.     BP 137/87    Pulse 85    Temp 97.8 F (36.6 C) (Temporal)    Ht 5\' 4"  (1.626 m)    Wt 188 lb (85.3 kg)    SpO2 100%    BMI 32.27 kg/m  Wt Readings from Last 3 Encounters:  03/14/20 188 lb (85.3 kg)  02/24/20 185 lb 3.2 oz (84 kg)  08/30/19 180 lb 6.4 oz (81.8 kg)    Health Maintenance Due  Topic Date Due   COLONOSCOPY (Pts 45-26yrs Insurance coverage will need to be confirmed)  Never done    There are no preventive care reminders to display for this patient.   No results found for: TSH Lab Results  Component Value Date   WBC 11.0 (H) 02/20/2020   HGB 14.0 02/20/2020   HCT 41.5 02/20/2020   MCV 89 02/20/2020   PLT 376 02/20/2020   Lab Results  Component Value Date   NA 139 02/20/2020   K 3.7 02/20/2020   CO2 24 02/20/2020   GLUCOSE 89 02/20/2020   BUN 17 02/20/2020   CREATININE 0.94 02/20/2020   BILITOT 0.4 02/20/2020   ALKPHOS 94 02/20/2020   AST 12 02/20/2020   ALT 14 02/20/2020   PROT 7.6 02/20/2020   ALBUMIN 4.3 02/20/2020   CALCIUM 9.7 02/20/2020   ANIONGAP 11 01/17/2019   Lab Results  Component Value Date    CHOL 140 02/20/2020   Lab Results  Component Value Date   HDL 63 02/20/2020   Lab Results  Component Value Date  LDLCALC 59 02/20/2020   Lab Results  Component Value Date   TRIG 97 02/20/2020   Lab Results  Component Value Date   CHOLHDL 2.2 02/20/2020   Lab Results  Component Value Date   HGBA1C 5.6 02/20/2020       Assessment & Plan:   Problem List Items Addressed This Visit   None   Visit Diagnoses    Strain of right trapezius muscle, initial encounter    -  Primary   Relevant Medications   traMADol (ULTRAM) 50 MG tablet   methocarbamol (ROBAXIN) 500 MG tablet       Meds ordered this encounter  Medications   traMADol (ULTRAM) 50 MG tablet    Sig: Take 1 tablet (50 mg total) by mouth every 12 (twelve) hours as needed for up to 5 days.    Dispense:  20 tablet    Refill:  0    Order Specific Question:   Supervising Provider    Answer:   Neva Seat, JEFFREY R [[id number]]   methocarbamol (ROBAXIN) 500 MG tablet    Sig: Take 1 tablet (500 mg total) by mouth 4 (four) times daily.    Dispense:  60 tablet    Refill:  0    Order Specific Question:   Supervising Provider    Answer:   Neva Seat, JEFFREY R [[id number]]   PLAN  Suspect trapezius strain. Likely has been ongoing tightness that did not need major precipitating event. Pt admits infrequent stretching  Will send tramadol to use once or twice daily prn. Robaxin. Reviewed home stretching for trapezius and pectorals. Suggest full stretching 1-2 times daily 6-7 days each week.  No evidence that imaging would provide additional benefit at this time.  Patient encouraged to call clinic with any questions, comments, or concerns.   Janeece Agee, NP

## 2020-03-16 ENCOUNTER — Other Ambulatory Visit: Payer: Self-pay | Admitting: Registered Nurse

## 2020-03-16 DIAGNOSIS — F411 Generalized anxiety disorder: Secondary | ICD-10-CM

## 2020-03-19 NOTE — Telephone Encounter (Signed)
Requested medication (s) are due for refill today: no  Requested medication (s) are on the active medication list: yes  Last refill:  01/31/2020  Future visit scheduled:  yes  Notes to clinic:  this refill cannot be delegated   Requested Prescriptions  Pending Prescriptions Disp Refills   LORazepam (ATIVAN) 0.5 MG tablet [Pharmacy Med Name: LORAZEPAM 0.5 MG TABLET] 30 tablet 0    Sig: TAKE 1 TABLET BY MOUTH EVERY DAY AS NEEDED FOR ANXIETY      Not Delegated - Psychiatry:  Anxiolytics/Hypnotics Failed - 03/16/2020  2:50 PM      Failed - This refill cannot be delegated      Failed - Urine Drug Screen completed in last 360 days      Passed - Valid encounter within last 6 months    Recent Outpatient Visits           5 days ago Strain of right trapezius muscle, initial encounter   [facility], Gerlene Burdock, NP   3 weeks ago Annual physical exam   Primary Care at [facility], Sandria Bales, MD   4 weeks ago Wellness examination   Primary Care at [place], [place], MD   6 months ago Bilateral back pain, unspecified back location, unspecified chronicity   [facility] at [facility], Gerlene Burdock, NP   7 months ago Muscle cramp   Primary Care at [facility], Eilleen Kempf, MD       Future Appointments             In 11 months Sagardia, Eilleen Kempf, MD [facility] [place]

## 2020-03-19 NOTE — Telephone Encounter (Signed)
Patient is requesting a refill of the following medications: Requested Prescriptions   Pending Prescriptions Disp Refills  . LORazepam (ATIVAN) 0.5 MG tablet [Pharmacy Med Name: LORAZEPAM 0.5 MG TABLET] 30 tablet 0    Sig: TAKE 1 TABLET BY MOUTH EVERY DAY AS NEEDED FOR ANXIETY    Date of patient request:03/16/2020 Last office visit: 03/14/2020 Date of last refill: 01/31/2020 Last refill amount: 30 tablets Follow up time period per chart:

## 2020-03-20 DIAGNOSIS — Z1231 Encounter for screening mammogram for malignant neoplasm of breast: Secondary | ICD-10-CM | POA: Diagnosis not present

## 2020-03-20 DIAGNOSIS — Z01419 Encounter for gynecological examination (general) (routine) without abnormal findings: Secondary | ICD-10-CM | POA: Diagnosis not present

## 2020-03-20 DIAGNOSIS — Z130 Encounter for screening for diseases of the blood and blood-forming organs and certain disorders involving the immune mechanism: Secondary | ICD-10-CM | POA: Diagnosis not present

## 2020-03-20 DIAGNOSIS — R3121 Asymptomatic microscopic hematuria: Secondary | ICD-10-CM | POA: Diagnosis not present

## 2020-03-20 DIAGNOSIS — Z6832 Body mass index (BMI) 32.0-32.9, adult: Secondary | ICD-10-CM | POA: Diagnosis not present

## 2020-03-27 ENCOUNTER — Telehealth (INDEPENDENT_AMBULATORY_CARE_PROVIDER_SITE_OTHER): Payer: BC Managed Care – PPO | Admitting: Emergency Medicine

## 2020-03-27 ENCOUNTER — Other Ambulatory Visit: Payer: Self-pay

## 2020-03-27 ENCOUNTER — Encounter: Payer: Self-pay | Admitting: Emergency Medicine

## 2020-03-27 VITALS — Ht 64.0 in | Wt 186.0 lb

## 2020-03-27 DIAGNOSIS — U071 COVID-19: Secondary | ICD-10-CM

## 2020-03-27 DIAGNOSIS — R059 Cough, unspecified: Secondary | ICD-10-CM | POA: Diagnosis not present

## 2020-03-27 MED ORDER — HYDROCODONE-HOMATROPINE 5-1.5 MG/5ML PO SYRP: 5.0000 mL | ORAL_SOLUTION | Freq: Every evening | ORAL | 0 refills | Status: DC | PRN

## 2020-03-27 NOTE — Patient Instructions (Signed)
° ° ° °  If you have lab work done today you will be contacted with your lab results within the next 2 weeks.  If you have not heard from us then please contact us. The fastest way to get your results is to register for My Chart. ° ° °IF you received an x-ray today, you will receive an invoice from [facility]. Please contact [facility] at [phone] with questions or concerns regarding your invoice.  ° °IF you received labwork today, you will receive an invoice from [facility]. Please contact [facility] at [phone] with questions or concerns regarding your invoice.  ° °Our billing staff will not be able to assist you with questions regarding bills from these companies. ° °You will be contacted with the lab results as soon as they are available. The fastest way to get your results is to activate your My Chart account. Instructions are located on the last page of this paperwork. If you have not heard from us regarding the results in 2 weeks, please contact this office. °  ° ° ° °

## 2020-03-27 NOTE — Progress Notes (Signed)
Telemedicine Encounter- SOAP NOTE Established Patient Patient: Home  Provider: Office     This telephone encounter was conducted with the patient's (or proxy's) verbal consent via audio telecommunications: yes/no: Yes Patient was instructed to have this encounter in a suitably private space; and to only have persons present to whom they give permission to participate. In addition, patient identity was confirmed by use of name plus two identifiers (DOB and address).  I discussed the limitations, risks, security and privacy concerns of performing an evaluation and management service by telephone and the availability of in person appointments. I also discussed with the patient that there may be a patient responsible charge related to this service. The patient expressed understanding and agreed to proceed.  I spent a total of TIME; 0 MIN TO 60 MIN: 20 minutes talking with the patient or their proxy.  Chief Complaint  Patient presents with  . Cough    Per patient tested at work last Monday for Covid 19 results were positive, and cough started Thursday. Patient wants medication for the cough.  . Headache    Per patient had the headache last Thursday    Subjective   Cathy Cole is a 50 y.o. female established patient. Telephone visit today complaining of cough for 1 week. Tested positive for COVID 8 days ago. Initially had a headache but not anymore. Cough is the only persistent symptom. Otherwise doing well. No complications. Denies difficulty breathing. No other complaints or medical concerns today.  HPI   Patient Active Problem List   Diagnosis Date Noted  . Essential hypertension 08/10/2018  . Generalized anxiety disorder 05/10/2018    Past Medical History:  Diagnosis Date  . Abnormal uterine bleeding (AUB) 11/07/2014  . Allergy   . Anxiety   . Hypertension   . Menorrhagia 11/07/2014    Current Outpatient Medications  Medication Sig Dispense Refill  . acetaminophen  (TYLENOL) 500 MG tablet Take 1,000 mg by mouth every 6 (six) hours as needed for mild pain, moderate pain or headache.    . Cholecalciferol (VITAMIN D3) 2000 UNITS TABS Take 2,000 Units by mouth daily.     Marland Kitchen LORazepam (ATIVAN) 0.5 MG tablet TAKE 1 TABLET BY MOUTH EVERY DAY AS NEEDED FOR ANXIETY 30 tablet 0  . losartan-hydrochlorothiazide (HYZAAR) 50-12.5 MG tablet Take 1 tablet by mouth daily. 90 tablet 3  . metoprolol succinate (TOPROL-XL) 25 MG 24 hr tablet TAKE 1 TABLET BY MOUTH EVERY DAY 90 tablet 3  . methocarbamol (ROBAXIN) 500 MG tablet Take 1 tablet (500 mg total) by mouth 4 (four) times daily. (Patient not taking: Reported on 03/27/2020) 60 tablet 0  . metroNIDAZOLE (METROGEL VAGINAL) 0.75 % vaginal gel Place 1 Applicatorful vaginally 2 (two) times daily. (Patient not taking: Reported on 03/27/2020) 70 g 2   No current facility-administered medications for this visit.    Allergies  Allergen Reactions  . Bactrim [Sulfamethoxazole-Trimethoprim] Anaphylaxis    Chest pain  . Other Anaphylaxis    Social History   Socioeconomic History  . Marital status: Divorced    Spouse name: Not on file  . Number of children: 3  . Years of education: Not on file  . Highest education level: Not on file  Occupational History  . Not on file  Tobacco Use  . Smoking status: Never Smoker  . Smokeless tobacco: Never Used  Vaping Use  . Vaping Use: Never used  Substance and Sexual Activity  . Alcohol use: No  . Drug use:  Never  . Sexual activity: Yes  Other Topics Concern  . Not on file  Social History Narrative  . Not on file   Social Determinants of Health   Financial Resource Strain: Not on file  Food Insecurity: Not on file  Transportation Needs: Not on file  Physical Activity: Not on file  Stress: Not on file  Social Connections: Not on file  Intimate Partner Violence: Not on file    ROS  Objective  Alert and oriented x3 in no apparent respiratory distress Vitals as reported  by the patient: Today's Vitals   03/27/20 1138  Weight: 186 lb (84.4 kg)  Height: 5\' 4"  (1.626 m)    There are no diagnoses linked to this encounter. Clinically stable. No red flag signs or symptoms. COVID advice given. ED precautions given. Advised to contact the office if no better or worse during the next several days.  I discussed the assessment and treatment plan with the patient. The patient was provided an opportunity to ask questions and all were answered. The patient agreed with the plan and demonstrated an understanding of the instructions.   The patient was advised to call back or seek an in-person evaluation if the symptoms worsen or if the condition fails to improve as anticipated.  I provided 20 minutes of non-face-to-face time during this encounter.  , MD  Primary Care at [facility]

## 2020-03-28 ENCOUNTER — Telehealth: Payer: Self-pay | Admitting: Emergency Medicine

## 2020-03-28 NOTE — Telephone Encounter (Signed)
Pt called stated that her pharmacy does not have the HYDROcodone-homatropine (HYCODAN) 5-1.5 MG/5ML syrup [[id number]]  Pt would like this sent to the  [facility] on [address], [place], [place] [address]  Please advise.

## 2020-03-29 ENCOUNTER — Other Ambulatory Visit: Payer: Self-pay | Admitting: Emergency Medicine

## 2020-03-29 ENCOUNTER — Encounter: Payer: Self-pay | Admitting: *Deleted

## 2020-03-29 DIAGNOSIS — R059 Cough, unspecified: Secondary | ICD-10-CM

## 2020-03-29 MED ORDER — PROMETHAZINE-DM 6.25-15 MG/5ML PO SYRP: 5.0000 mL | ORAL_SOLUTION | Freq: Four times a day (QID) | ORAL | 0 refills | Status: DC | PRN

## 2020-03-29 NOTE — Telephone Encounter (Signed)
Patient is going to call pharmacy to make sure they have it before send a new prescription.

## 2020-03-29 NOTE — Telephone Encounter (Signed)
Noted  

## 2020-03-29 NOTE — Telephone Encounter (Signed)
New prescription sent to pharmacy of record.  Thanks.

## 2020-03-29 NOTE — Telephone Encounter (Signed)
Patient has called around and she cannot find a pharmacy that has the Hycodan in stock.    She would like something else called in if you can.   To [facility] [address].

## 2020-04-05 ENCOUNTER — Encounter: Payer: Self-pay | Admitting: Emergency Medicine

## 2020-04-05 ENCOUNTER — Other Ambulatory Visit: Payer: Self-pay | Admitting: Emergency Medicine

## 2020-04-05 DIAGNOSIS — R059 Cough, unspecified: Secondary | ICD-10-CM

## 2020-04-05 MED ORDER — BENZONATATE 200 MG PO CAPS: 200.0000 mg | ORAL_CAPSULE | Freq: Two times a day (BID) | ORAL | 0 refills | Status: DC | PRN

## 2020-04-05 MED ORDER — HYDROCODONE-HOMATROPINE 5-1.5 MG/5ML PO SYRP: 5.0000 mL | ORAL_SOLUTION | Freq: Every evening | ORAL | 0 refills | Status: DC | PRN

## 2020-04-05 NOTE — Telephone Encounter (Signed)
Steroids not recommended.

## 2020-04-05 NOTE — Telephone Encounter (Signed)
Prescription for Tessalon and Hycodan syrup sent.  Thanks.

## 2020-04-24 ENCOUNTER — Other Ambulatory Visit: Payer: Self-pay | Admitting: Registered Nurse

## 2020-04-24 DIAGNOSIS — F411 Generalized anxiety disorder: Secondary | ICD-10-CM

## 2020-04-24 NOTE — Telephone Encounter (Signed)
Requested medication (s) are due for refill today - yes  Requested medication (s) are on the active medication list -yes  Future visit scheduled -yes  Last refill: 03/19/20 #30  Notes to clinic: Request non delegated rx  Requested Prescriptions  Pending Prescriptions Disp Refills   LORazepam (ATIVAN) 0.5 MG tablet [Pharmacy Med Name: LORAZEPAM 0.5 MG TABLET] 30 tablet 0    Sig: TAKE 1 TABLET BY MOUTH EVERY DAY AS NEEDED FOR ANXIETY      Not Delegated - Psychiatry:  Anxiolytics/Hypnotics Failed - 04/24/2020  2:41 PM      Failed - This refill cannot be delegated      Failed - Urine Drug Screen completed in last 360 days      Passed - Valid encounter within last 6 months    Recent Outpatient Visits           4 weeks ago COVID-19 virus infection   Primary Care at [facility], [place], MD   1 month ago Strain of right trapezius muscle, initial encounter   [facility], Gerlene Burdock, NP   2 months ago Annual physical exam   Primary Care at [facility], Sandria Bales, MD   2 months ago Wellness examination   Primary Care at [place], [place], MD   7 months ago Bilateral back pain, unspecified back location, unspecified chronicity   [facility] at [facility], Gerlene Burdock, NP       Future Appointments             In 1 month Little Ishikawa, MD [facility] [place], [facility]   In 10 months Sagardia, Eilleen Kempf, MD Primary Care at [place], [facility]                 Requested Prescriptions  Pending Prescriptions Disp Refills   LORazepam (ATIVAN) 0.5 MG tablet [Pharmacy Med Name: LORAZEPAM 0.5 MG TABLET] 30 tablet 0    Sig: TAKE 1 TABLET BY MOUTH EVERY DAY AS NEEDED FOR ANXIETY      Not Delegated - Psychiatry:  Anxiolytics/Hypnotics Failed - 04/24/2020  2:41 PM      Failed - This refill cannot be delegated      Failed - Urine Drug Screen completed in last 360 days      Passed - Valid encounter within last 6 months    Recent Outpatient  Visits           4 weeks ago COVID-19 virus infection   Primary Care at [facility], [place], MD   1 month ago Strain of right trapezius muscle, initial encounter   [facility] at [facility], Gerlene Burdock, NP   2 months ago Annual physical exam   Primary Care at [facility], Sandria Bales, MD   2 months ago Wellness examination   Primary Care at [place], [place], MD   7 months ago Bilateral back pain, unspecified back location, unspecified chronicity   Primary Care at [facility], Gerlene Burdock, NP       Future Appointments             In 1 month Bjorn Pippin Tanna Savoy, MD [facility] [place], [facility]   In 10 months Sagardia, Eilleen Kempf, MD Primary Care at [place], [facility]

## 2020-04-24 NOTE — Telephone Encounter (Signed)
Sent to wrong office Thanks

## 2020-04-24 NOTE — Telephone Encounter (Signed)
Rx forwarded to wrong office- apologies. Request for non delegated rx- sent for review

## 2020-04-25 ENCOUNTER — Other Ambulatory Visit: Payer: Self-pay | Admitting: Emergency Medicine

## 2020-04-25 DIAGNOSIS — F411 Generalized anxiety disorder: Secondary | ICD-10-CM

## 2020-04-25 MED ORDER — LORAZEPAM 0.5 MG PO TABS: 0.5000 mg | ORAL_TABLET | Freq: Every day | ORAL | 1 refills | Status: DC | PRN

## 2020-04-25 NOTE — Telephone Encounter (Signed)
Patient is requesting a refill of the following medications: Requested Prescriptions   Pending Prescriptions Disp Refills   LORazepam (ATIVAN) 0.5 MG tablet [Pharmacy Med Name: LORAZEPAM 0.5 MG TABLET] 30 tablet 0    Sig: TAKE 1 TABLET BY MOUTH EVERY DAY AS NEEDED FOR ANXIETY    Date of patient request: 04/24/20 Last office visit: 03/27/20 Date of last refill: 03/19/20 Last refill amount: 30 Follow up time period per chart: 02/26/21

## 2020-04-25 NOTE — Telephone Encounter (Signed)
New prescription sent in. Thanks.

## 2020-04-26 DIAGNOSIS — R1084 Generalized abdominal pain: Secondary | ICD-10-CM | POA: Diagnosis not present

## 2020-04-26 DIAGNOSIS — R3121 Asymptomatic microscopic hematuria: Secondary | ICD-10-CM | POA: Diagnosis not present

## 2020-05-04 DIAGNOSIS — H524 Presbyopia: Secondary | ICD-10-CM | POA: Diagnosis not present

## 2020-05-07 ENCOUNTER — Other Ambulatory Visit: Payer: Self-pay | Admitting: Emergency Medicine

## 2020-05-07 DIAGNOSIS — I10 Essential (primary) hypertension: Secondary | ICD-10-CM

## 2020-05-07 NOTE — Telephone Encounter (Signed)
New prescription sent to pharmacy of record.  Thanks.

## 2020-05-07 NOTE — Telephone Encounter (Signed)
Rx currently unavailable- pharmacy is requesting alternative.

## 2020-05-07 NOTE — Telephone Encounter (Signed)
Please advise on an alternative this RX is currently unavailable.

## 2020-05-14 DIAGNOSIS — N200 Calculus of kidney: Secondary | ICD-10-CM | POA: Diagnosis not present

## 2020-05-14 DIAGNOSIS — R3121 Asymptomatic microscopic hematuria: Secondary | ICD-10-CM | POA: Diagnosis not present

## 2020-05-25 ENCOUNTER — Ambulatory Visit: Payer: BC Managed Care – PPO | Admitting: Cardiology

## 2020-05-25 ENCOUNTER — Ambulatory Visit: Payer: Self-pay

## 2020-06-14 NOTE — Progress Notes (Deleted)
Cardiology Office Note:    Date:  06/14/2020   ID:  Cathy Cole, DOB 03-31-1970, MRN [id number]  PCP:  Cathy Quint, MD  Cardiologist:  No primary care provider on file.  Electrophysiologist:  None   Referring MD: Cathy Cole, *   No chief complaint on file.   History of Present Illness:    Cathy Cole is a 50 y.o. female with a hx of hypertension who presents for follow-up.  She was referred by Dr. Alvy Cole for an initial evaluation of chest pain on 01/25/2019. Presented to the ED on 01/17/2019 with chest pain. Normal EKG and troponins were negative. Work-up was unremarkable and she was discharged.  Reports that she has chest pain daily.  Reports pain is left-sided, describes as dull aching pain, 3 out of 10 in intensity.  Can last for 20 to 30 minutes but up to several hours.  She states that she does not get regular exercise but does walk up the stairs in her home.  Has not noted chest pain with exertion.  Does note that chest pain seems to occur when her blood pressure is elevated.  Denies any shortness of breath.    She previously saw a cardiologist in 2012 in [place].  Was being seen for hypertension and palpitations.  Wore heart monitor and was told no arrhythmia.  Had stress test and was told was normal.  Does not have significant issues with palpitations anymore.  Never smoker.  No family history heart disease in immediate family.  TTE on 02/03/2019 showed normal LV systolic function, grade 1 diastolic dysfunction.  Coronary CTA on 03/25/2019 showed normal coronary arteries. 4 days per week, 30 minutes.    Since last clinic visit,    she reports that she continues to have chest pain.  Occurs about twice per month.  States that it seems to correlate with her menstrual cycle.  Describes a dull aching left-sided chest pain.  Reports that her blood pressure has been well controlled.  Past Medical History:  Diagnosis Date  . Abnormal uterine bleeding  (AUB) 11/07/2014  . Allergy   . Anxiety   . Hypertension   . Menorrhagia 11/07/2014    Past Surgical History:  Procedure Laterality Date  . BUNIONECTOMY    . COSMETIC SURGERY    . NOVASURE ABLATION N/A 11/08/2014   Procedure: NOVASURE ABLATION;  Surgeon: Cathy Rein, MD;  Location: [facility];  Service: Gynecology;  Laterality: N/A;  . TUBAL LIGATION      Current Medications: No outpatient medications have been marked as taking for the 06/15/20 encounter (Appointment) with Little Ishikawa, MD.     Allergies:   Bactrim [sulfamethoxazole-trimethoprim] and Other   Social History   Socioeconomic History  . Marital status: Divorced    Spouse name: Not on file  . Number of children: 3  . Years of education: Not on file  . Highest education level: Not on file  Occupational History  . Not on file  Tobacco Use  . Smoking status: Never Smoker  . Smokeless tobacco: Never Used  Vaping Use  . Vaping Use: Never used  Substance and Sexual Activity  . Alcohol use: No  . Drug use: Never  . Sexual activity: Yes  Other Topics Concern  . Not on file  Social History Narrative  . Not on file   Social Determinants of Health   Financial Resource Strain: Not on file  Food Insecurity: Not on file  Transportation Needs:  Not on file  Physical Activity: Not on file  Stress: Not on file  Social Connections: Not on file     Family History: The patient's family history includes Diabetes in her maternal grandmother and another family member; Hypertension in her maternal grandmother, mother, and another family member.  ROS:   Please see the history of present illness.     All other systems reviewed and are negative.  EKGs/Labs/Other Studies Reviewed:    The following studies were reviewed today:   EKG:  EKG is  ordered today.  The ekg ordered today demonstrates sinus rhythm, rate 88, nonspecific T wave flattening in V3-6, I, III, aVL  Coronary CTA 03/25/19: 1. Coronary  calcium score of 0. This was 0 percentile for age and sex matched control. 2. Normal coronary origin with right dominance. 3. No evidence of CAD.  CAD-RADS 0. No evidence of CAD (0%). Consider non-atherosclerotic causes of chest pain.  IMPRESSION: No significant incidental noncardiac findings are noted.  TTE 02/03/2019: 1. Left ventricular ejection fraction, by visual estimation, is 55 to  60%. The left ventricle has normal function. There is no left ventricular  hypertrophy.  2. Left ventricular diastolic parameters are consistent with Grade I  diastolic dysfunction (impaired relaxation).  3. Global right ventricle has normal systolic function.The right  ventricular size is normal.  4. Left atrial size was normal.  5. Right atrial size was normal.  6. The mitral valve is normal in structure. No evidence of mitral valve  regurgitation. No evidence of mitral stenosis.  7. The tricuspid valve is normal in structure. Tricuspid valve  regurgitation is trivial.  8. The aortic valve is tricuspid. Aortic valve regurgitation is not  visualized. No evidence of aortic valve sclerosis or stenosis.  9. The pulmonic valve was normal in structure. Pulmonic valve  regurgitation is not visualized.  10. The inferior vena cava is normal in size with greater than 50%  respiratory variability, suggesting right atrial pressure of 3 mmHg.  11. Normal LV systolic function; grade 1 diastolic dysfunction.   Recent Labs: 08/30/2019: Magnesium CANCELED 02/20/2020: ALT 14; BUN 17; Creatinine, Ser 0.94; Hemoglobin 14.0; Platelets 376; Potassium 3.7; Sodium 139  Recent Lipid Panel    Component Value Date/Time   CHOL 140 02/20/2020 0945   TRIG 97 02/20/2020 0945   HDL 63 02/20/2020 0945   CHOLHDL 2.2 02/20/2020 0945   LDLCALC 59 02/20/2020 0945    Physical Exam:    VS:  There were no vitals taken for this visit.    Wt Readings from Last 3 Encounters:  03/27/20 186 lb (84.4 kg)  03/14/20  188 lb (85.3 kg)  02/24/20 185 lb 3.2 oz (84 kg)     GEN:  Well nourished, well developed in no acute distress HEENT: Normal NECK: No JVD; No carotid bruits LYMPHATICS: No lymphadenopathy CARDIAC: RRR, no murmurs, rubs, gallops RESPIRATORY:  Clear to auscultation without rales, wheezing or rhonchi  ABDOMEN: Soft, non-tender, non-distended MUSCULOSKELETAL:  No edema; No deformity  SKIN: Warm and dry NEUROLOGIC:  Alert and oriented x 3 PSYCHIATRIC:  Normal affect   ASSESSMENT:    No diagnosis found. PLAN:    In order of problems listed above:  Chest pain: TTE on 02/03/2019 showed normal LV systolic function, grade 1 diastolic dysfunction.  Coronary CTA on 03/25/2019 showed normal coronary arteries.  Suspect noncardiac chest pain.  No further cardiac work-up recommended  Hypertension: On valsartan-HCTZ 80-12.5 mg daily and Toprol-XL 25 mg daily  RTC in***  Medication Adjustments/Labs and Tests Ordered: Current medicines are reviewed at length with the patient today.  Concerns regarding medicines are outlined above.  No orders of the defined types were placed in this encounter.  No orders of the defined types were placed in this encounter.   There are no Patient Instructions on file for this visit.   Signed, Little Ishikawa, MD  06/14/2020 9:47 PM    [facility]

## 2020-06-15 ENCOUNTER — Encounter: Payer: Self-pay | Admitting: Cardiology

## 2020-06-15 ENCOUNTER — Other Ambulatory Visit: Payer: Self-pay

## 2020-06-15 ENCOUNTER — Ambulatory Visit (INDEPENDENT_AMBULATORY_CARE_PROVIDER_SITE_OTHER): Payer: BC Managed Care – PPO | Admitting: Cardiology

## 2020-06-15 VITALS — BP 130/82 | HR 87 | Ht 64.0 in | Wt 183.0 lb

## 2020-06-15 DIAGNOSIS — R079 Chest pain, unspecified: Secondary | ICD-10-CM | POA: Diagnosis not present

## 2020-06-15 DIAGNOSIS — I10 Essential (primary) hypertension: Secondary | ICD-10-CM

## 2020-06-15 DIAGNOSIS — H90A31 Mixed conductive and sensorineural hearing loss, unilateral, right ear with restricted hearing on the contralateral side: Secondary | ICD-10-CM | POA: Diagnosis not present

## 2020-06-15 NOTE — Progress Notes (Signed)
Cardiology Office Note:    Date:  06/15/2020   ID:  Cathy Cole, DOB October 16, 1970, MRN [id number]  PCP:  Georgina Quint, MD  Cardiologist:  No primary care provider on file.  Electrophysiologist:  None   Referring MD: Georgina Quint, *   Chief Complaint  Patient presents with  . Follow-up  . Edema    Hands.  . Chest Pain    History of Present Illness:    Cathy Cole is a 50 y.o. female with a hx of hypertension who presents for follow-up.  She was referred by Dr. Alvy Bimler for an initial evaluation of chest pain on 01/25/2019. Presented to the ED on 01/17/2019 with chest pain. Normal EKG and troponins were negative. Work-up was unremarkable and she was discharged.  Reports that she has chest pain daily.  Reports pain is left-sided, describes as dull aching pain, 3 out of 10 in intensity.  Can last for 20 to 30 minutes but up to several hours.  She states that she does not get regular exercise but does walk up the stairs in her home.  Has not noted chest pain with exertion.  Does note that chest pain seems to occur when her blood pressure is elevated.  Denies any shortness of breath.    She previously saw a cardiologist in 2012 in [place].  Was being seen for hypertension and palpitations.  Wore heart monitor and was told no arrhythmia.  Had stress test and was told was normal.  Does not have significant issues with palpitations anymore.  Never smoker.  No family history heart disease in immediate family.  TTE on 02/03/2019 showed normal LV systolic function, grade 1 diastolic dysfunction.  Coronary CTA on 03/25/2019 showed normal coronary arteries.  Since last clinic visit, she is still having left chest pains. She describes the pain as dull. The pain occurs 3 times a month and occurs for about 30 minutes. After taking her Lorazepam medication the symptoms resolve. She denies having any SOB during these episodes. She had COVID in January but does not have any lasting  symptoms. However she has had swelling in her hands when she gets over heated or too hot. She has no palpitations, chest pain or tightness, or LE edema. She walks 4 days per week during her 30 minutes breaks at work. She denies having any exertional chest pain during her walks. Otherwise her blood pressure has been well controlled.  Past Medical History:  Diagnosis Date  . Abnormal uterine bleeding (AUB) 11/07/2014  . Allergy   . Anxiety   . Hypertension   . Menorrhagia 11/07/2014    Past Surgical History:  Procedure Laterality Date  . BUNIONECTOMY    . COSMETIC SURGERY    . NOVASURE ABLATION N/A 11/08/2014   Procedure: NOVASURE ABLATION;  Surgeon: Sherian Rein, MD;  Location: [facility];  Service: Gynecology;  Laterality: N/A;  . TUBAL LIGATION      Current Medications: Current Meds  Medication Sig  . acetaminophen (TYLENOL) 500 MG tablet Take 1,000 mg by mouth every 6 (six) hours as needed for mild pain, moderate pain or headache.  . Cholecalciferol (VITAMIN D3) 2000 UNITS TABS Take 2,000 Units by mouth daily.   Marland Kitchen LORazepam (ATIVAN) 0.5 MG tablet Take 1 tablet (0.5 mg total) by mouth daily as needed for anxiety.  . metoprolol succinate (TOPROL-XL) 25 MG 24 hr tablet TAKE 1 TABLET BY MOUTH EVERY DAY  . valsartan-hydrochlorothiazide (DIOVAN HCT) 80-12.5 MG tablet Take 1  tablet by mouth daily.  . [DISCONTINUED] metroNIDAZOLE (METROGEL VAGINAL) 0.75 % vaginal gel Place 1 Applicatorful vaginally 2 (two) times daily.     Allergies:   Bactrim [sulfamethoxazole-trimethoprim] and Other   Social History   Socioeconomic History  . Marital status: Divorced    Spouse name: Not on file  . Number of children: 3  . Years of education: Not on file  . Highest education level: Not on file  Occupational History  . Not on file  Tobacco Use  . Smoking status: Never Smoker  . Smokeless tobacco: Never Used  Vaping Use  . Vaping Use: Never used  Substance and Sexual Activity  . Alcohol  use: No  . Drug use: Never  . Sexual activity: Yes  Other Topics Concern  . Not on file  Social History Narrative  . Not on file   Social Determinants of Health   Financial Resource Strain: Not on file  Food Insecurity: Not on file  Transportation Needs: Not on file  Physical Activity: Not on file  Stress: Not on file  Social Connections: Not on file     Family History: The patient's family history includes Diabetes in her maternal grandmother and another family member; Hypertension in her maternal grandmother, mother, and another family member.  ROS:   Please see the history of present illness.    (+)Swelling in bilateral hands (+)Occassional dull chest pain  All other systems reviewed and are negative.  EKGs/Labs/Other Studies Reviewed:    The following studies were reviewed today:   EKG:   4/22- sinus rhythm, Rate: 87, Poor R progressions, no ST abnormalities 3/22-sinus rhythm, Rate 88, nonspecific T wave flattening in V3-6, I, III, aVL  Coronary CTA 03/25/19: 1. Coronary calcium score of 0. This was 0 percentile for age and sex matched control. 2. Normal coronary origin with right dominance. 3. No evidence of CAD.  CAD-RADS 0. No evidence of CAD (0%). Consider non-atherosclerotic causes of chest pain.  IMPRESSION: No significant incidental noncardiac findings are noted.  TTE 02/03/2019: 1. Left ventricular ejection fraction, by visual estimation, is 55 to  60%. The left ventricle has normal function. There is no left ventricular  hypertrophy.  2. Left ventricular diastolic parameters are consistent with Grade I  diastolic dysfunction (impaired relaxation).  3. Global right ventricle has normal systolic function.The right  ventricular size is normal.  4. Left atrial size was normal.  5. Right atrial size was normal.  6. The mitral valve is normal in structure. No evidence of mitral valve  regurgitation. No evidence of mitral stenosis.  7. The  tricuspid valve is normal in structure. Tricuspid valve  regurgitation is trivial.  8. The aortic valve is tricuspid. Aortic valve regurgitation is not  visualized. No evidence of aortic valve sclerosis or stenosis.  9. The pulmonic valve was normal in structure. Pulmonic valve  regurgitation is not visualized.  10. The inferior vena cava is normal in size with greater than 50%  respiratory variability, suggesting right atrial pressure of 3 mmHg.  11. Normal LV systolic function; grade 1 diastolic dysfunction.   Recent Labs: 08/30/2019: Magnesium CANCELED 02/20/2020: ALT 14; BUN 17; Creatinine, Ser 0.94; Hemoglobin 14.0; Platelets 376; Potassium 3.7; Sodium 139  Recent Lipid Panel    Component Value Date/Time   CHOL 140 02/20/2020 0945   TRIG 97 02/20/2020 0945   HDL 63 02/20/2020 0945   CHOLHDL 2.2 02/20/2020 0945   LDLCALC 59 02/20/2020 0945    Physical Exam:  VS:  BP 130/82 (BP Location: Right Arm, Patient Position: Sitting, Cuff Size: Large)   Pulse 87   Ht 5\' 4"  (1.626 m)   Wt 183 lb (83 kg)   BMI 31.41 kg/m     Wt Readings from Last 3 Encounters:  06/15/20 183 lb (83 kg)  03/27/20 186 lb (84.4 kg)  03/14/20 188 lb (85.3 kg)     GEN:  Well nourished, well developed in no acute distress HEENT: Normal NECK: No JVD; No carotid bruits CARDIAC: RRR, no murmurs, rubs, gallops RESPIRATORY:  Clear to auscultation without rales, wheezing or rhonchi  ABDOMEN: Soft, non-tender, non-distended MUSCULOSKELETAL:  No edema; No deformity  SKIN: Warm and dry NEUROLOGIC:  Alert and oriented x 3 PSYCHIATRIC:  Normal affect   ASSESSMENT:    1. Chest pain of uncertain etiology   2. Essential hypertension    PLAN:    In order of problems listed above:  Chest pain: TTE on 02/03/2019 showed normal LV systolic function, grade 1 diastolic dysfunction.  Coronary CTA on 03/25/2019 showed normal coronary arteries.  Suspect noncardiac chest pain.  No further cardiac work-up  recommended  Hypertension: On valsartan-HCTZ 80-12.5 mg daily and Toprol-XL 25 mg daily  RTC as needed   Medication Adjustments/Labs and Tests Ordered: Current medicines are reviewed at length with the patient today.  Concerns regarding medicines are outlined above.  Orders Placed This Encounter  Procedures  . EKG 12-Lead   No orders of the defined types were placed in this encounter.   Patient Instructions  Medication Instructions:  No changes   Lab Work: None ordered   Testing/Procedures: None ordered   Follow-Up: At [facility], you and your health needs are our priority.  As part of our continuing mission to provide you with exceptional heart care, we have created designated Provider Care Teams.  These Care Teams include your primary Cardiologist (physician) and Advanced Practice Providers (APPs -  Physician Assistants and Nurse Practitioners) who all work together to provide you with the care you need, when you need it.  We recommend signing up for the patient portal called "MyChart".  Sign up information is provided on this After Visit Summary.  MyChart is used to connect with patients for Virtual Visits (Telemedicine).  Patients are able to view lab/test results, encounter notes, upcoming appointments, etc.  Non-urgent messages can be sent to your provider as well.   To learn more about what you can do with MyChart, go to [facility].    Your next appointment:  As Needed   The format for your next appointment: Office   Provider:  Dr.Lennon Boutwell       I,Alexis Bryant,acting as a scribe for [email or web address], MD.,have documented all relevant documentation on the behalf of Little Ishikawa, MD,as directed by  Little Ishikawa, MD while in the presence of Little Ishikawa, MD.   Signed, Little Ishikawa, MD  06/15/2020 10:18 AM     [facility]

## 2020-06-15 NOTE — Patient Instructions (Signed)
Medication Instructions:  No changes   Lab Work: None ordered   Testing/Procedures: None ordered   Follow-Up: At [facility], you and your health needs are our priority.  As part of our continuing mission to provide you with exceptional heart care, we have created designated Provider Care Teams.  These Care Teams include your primary Cardiologist (physician) and Advanced Practice Providers (APPs -  Physician Assistants and Nurse Practitioners) who all work together to provide you with the care you need, when you need it.  We recommend signing up for the patient portal called "MyChart".  Sign up information is provided on this After Visit Summary.  MyChart is used to connect with patients for Virtual Visits (Telemedicine).  Patients are able to view lab/test results, encounter notes, upcoming appointments, etc.  Non-urgent messages can be sent to your provider as well.   To learn more about what you can do with MyChart, go to [email or web address].    Your next appointment:  As Needed   The format for your next appointment: Office   Provider:  Dr.Schumann

## 2020-07-20 ENCOUNTER — Telehealth (INDEPENDENT_AMBULATORY_CARE_PROVIDER_SITE_OTHER): Payer: BC Managed Care – PPO | Admitting: Internal Medicine

## 2020-07-20 ENCOUNTER — Other Ambulatory Visit: Payer: Self-pay

## 2020-07-20 DIAGNOSIS — F411 Generalized anxiety disorder: Secondary | ICD-10-CM

## 2020-07-20 DIAGNOSIS — Z7689 Persons encountering health services in other specified circumstances: Secondary | ICD-10-CM | POA: Diagnosis not present

## 2020-07-20 DIAGNOSIS — M62838 Other muscle spasm: Secondary | ICD-10-CM

## 2020-07-20 MED ORDER — LORAZEPAM 0.5 MG PO TABS: 0.5000 mg | ORAL_TABLET | Freq: Every day | ORAL | 1 refills | Status: DC | PRN

## 2020-07-20 MED ORDER — CYCLOBENZAPRINE HCL 10 MG PO TABS: 10.0000 mg | ORAL_TABLET | Freq: Three times a day (TID) | ORAL | 0 refills | Status: DC | PRN

## 2020-07-20 NOTE — Progress Notes (Signed)
Virtual Visit via Telephone Note  I connected with Cathy Cole, on 07/20/2020 at 10:12 AM by telephone due to the COVID-19 pandemic and verified that I am speaking with the correct person using two identifiers.   Consent: I discussed the limitations, risks, security and privacy concerns of performing an evaluation and management service by telephone and the availability of in person appointments. I also discussed with the patient that there may be a patient responsible charge related to this service. The patient expressed understanding and agreed to proceed.   Location of Patient: Home   Location of Provider: Clinic    Persons participating in Telemedicine visit: Akira Perusse Pollock-CMA  Dr. Earlene Plater    History of Present Illness: Patient has a visit to establish care. Was previously seeing another PCP with [place], but their office has since closed. Had annual exam in Dec 2021. PMH of HTN and GAD. Wears hearing aids for hearing loss. Patient has surgical history of bunionectomy bilaterally, BTL, surgery for her ear.   Reports that she has been having muscle spasms in her left leg. Starts at back of ankle and travels up to mid calf. Reports that it is jumping constantly even when she is at rest. Does not drink a lot of water---only about 16 oz of water.    Past Medical History:  Diagnosis Date  . Abnormal uterine bleeding (AUB) 11/07/2014  . Allergy   . Anxiety   . Hypertension   . Menorrhagia 11/07/2014   Allergies  Allergen Reactions  . Bactrim [Sulfamethoxazole-Trimethoprim] Anaphylaxis    Chest pain  . Other Anaphylaxis    Current Outpatient Medications on File Prior to Visit  Medication Sig Dispense Refill  . acetaminophen (TYLENOL) 500 MG tablet Take 1,000 mg by mouth every 6 (six) hours as needed for mild pain, moderate pain or headache.    . Cholecalciferol (VITAMIN D3) 2000 UNITS TABS Take 2,000 Units by mouth daily.     Marland Kitchen LORazepam (ATIVAN) 0.5 MG  tablet Take 1 tablet (0.5 mg total) by mouth daily as needed for anxiety. 30 tablet 1  . metoprolol succinate (TOPROL-XL) 25 MG 24 hr tablet TAKE 1 TABLET BY MOUTH EVERY DAY 90 tablet 3  . valsartan-hydrochlorothiazide (DIOVAN HCT) 80-12.5 MG tablet Take 1 tablet by mouth daily. 90 tablet 3   No current facility-administered medications on file prior to visit.    Observations/Objective: NAD. Speaking clearly.  Work of breathing normal.  Alert and oriented. Mood appropriate.   Assessment and Plan: 1. Encounter to establish care Reviewed patient's PMH, social history, surgical history, and medications.   2. Generalized anxiety disorder - LORazepam (ATIVAN) 0.5 MG tablet; Take 1 tablet (0.5 mg total) by mouth daily as needed for anxiety.  Dispense: 30 tablet; Refill: 1  3. Muscle spasm Encouraged adequate hydration (aim >60oz/day) as this may help with the muscle spasms. Trial of Flexeril at night. Reviewed that electrolytes were normal in Dec 2021. If continues to have symptoms despite these changes, would redraw given medication regimen she is on.  - cyclobenzaprine (FLEXERIL) 10 MG tablet; Take 1 tablet (10 mg total) by mouth 3 (three) times daily as needed for muscle spasms.  Dispense: 30 tablet; Refill: 0    Follow Up Instructions: Annual exam Dec 2022   I discussed the assessment and treatment plan with the patient. The patient was provided an opportunity to ask questions and all were answered. The patient agreed with the plan and demonstrated an understanding of the instructions.  The patient was advised to call back or seek an in-person evaluation if the symptoms worsen or if the condition fails to improve as anticipated.     I provided 9 minutes total of non-face-to-face time during this encounter including median intraservice time, reviewing previous notes, investigations, ordering medications, medical decision making, coordinating care and patient verbalized understanding  at the end of the visit.    Marcy Siren, D.O. Primary Care at [facility]  07/20/2020, 10:12 AM

## 2020-08-03 ENCOUNTER — Other Ambulatory Visit: Payer: Self-pay | Admitting: Registered Nurse

## 2020-08-03 DIAGNOSIS — S46811A Strain of other muscles, fascia and tendons at shoulder and upper arm level, right arm, initial encounter: Secondary | ICD-10-CM

## 2020-08-06 ENCOUNTER — Other Ambulatory Visit: Payer: Self-pay

## 2020-08-06 DIAGNOSIS — I10 Essential (primary) hypertension: Secondary | ICD-10-CM

## 2020-08-06 MED ORDER — METOPROLOL SUCCINATE ER 25 MG PO TB24: 1.0000 | ORAL_TABLET | Freq: Every day | ORAL | 0 refills | Status: DC

## 2020-09-20 NOTE — Progress Notes (Signed)
Patient ID: Cathy Cole, female    DOB: [birth year]  MRN: [id number]  CC: Muscle Spasms Follow-Up   Subjective: Cathy Cole is a 50 y.o. female who presents for muscle spasms follow-up.   Her concerns today include:  MUSCLE SPASMS FOLLOW-UP: 07/20/2020 per DO note: Encouraged adequate hydration (aim >60oz/day) as this may help with the muscle spasms. Trial of Flexeril at night. Reviewed that electrolytes were normal in Dec 2021. If continues to have symptoms despite these changes, would redraw given medication regimen she is on.  - cyclobenzaprine (FLEXERIL) 10 MG tablet; Take 1 tablet (10 mg total) by mouth 3 (three) times daily as needed for muscle spasms.  Dispense: 30 tablet; Refill: 0   09/21/2020: Since last visit increased intake of water to at least 4 bottles daily. Flexeril not helping, quit taking 2 weeks after prescribed. Also, did not like the medication made her feel drowsy. Feeling that something is crawling up the legs. Denies redness, warmth, tenderness, pain, shortness of breath, and chest pain. Reports she has noticed varicose veins and not sure if this is the cause.     Patient Active Problem List   Diagnosis Date Noted   Essential hypertension 08/10/2018   Generalized anxiety disorder 05/10/2018     Current Outpatient Medications on File Prior to Visit  Medication Sig Dispense Refill   acetaminophen (TYLENOL) 500 MG tablet Take 1,000 mg by mouth every 6 (six) hours as needed for mild pain, moderate pain or headache.     Cholecalciferol (VITAMIN D3) 2000 UNITS TABS Take 2,000 Units by mouth daily.      LORazepam (ATIVAN) 0.5 MG tablet Take 1 tablet (0.5 mg total) by mouth daily as needed for anxiety. 30 tablet 1   metoprolol succinate (TOPROL-XL) 25 MG 24 hr tablet Take 1 tablet (25 mg total) by mouth daily. 90 tablet 0   cyclobenzaprine (FLEXERIL) 10 MG tablet Take 1 tablet (10 mg total) by mouth 3 (three) times daily as needed for muscle spasms. 30 tablet 0    methocarbamol (ROBAXIN) 500 MG tablet TAKE 1 TABLET BY MOUTH 4 TIMES DAILY. 60 tablet 0   valsartan-hydrochlorothiazide (DIOVAN HCT) 80-12.5 MG tablet Take 1 tablet by mouth daily. 90 tablet 3   No current facility-administered medications on file prior to visit.    Allergies  Allergen Reactions   Bactrim [Sulfamethoxazole-Trimethoprim] Anaphylaxis    Chest pain   Other Anaphylaxis    Social History   Socioeconomic History   Marital status: Divorced    Spouse name: Not on file   Number of children: 3   Years of education: Not on file   Highest education level: Not on file  Occupational History   Not on file  Tobacco Use   Smoking status: Never   Smokeless tobacco: Never  Vaping Use   Vaping Use: Never used  Substance and Sexual Activity   Alcohol use: No   Drug use: Never   Sexual activity: Yes  Other Topics Concern   Not on file  Social History Narrative   Not on file   Social Determinants of Health   Financial Resource Strain: Not on file  Food Insecurity: Not on file  Transportation Needs: Not on file  Physical Activity: Not on file  Stress: Not on file  Social Connections: Not on file  Intimate Partner Violence: Not on file    Family History  Problem Relation Age of Onset   Diabetes Other    Hypertension Other  Hypertension Mother    Diabetes Maternal Grandmother    Hypertension Maternal Grandmother     Past Surgical History:  Procedure Laterality Date   BUNIONECTOMY     COSMETIC SURGERY     NOVASURE ABLATION N/A 11/08/2014   Procedure: NOVASURE ABLATION;  Surgeon: Sherian Rein, MD;  Location: [facility];  Service: Gynecology;  Laterality: N/A;   TUBAL LIGATION      ROS: Review of Systems Negative except as stated above  PHYSICAL EXAM: BP 115/82 (BP Location: Left Arm, Patient Position: Sitting, Cuff Size: Normal)   Pulse 93   Temp 98.1 F (36.7 C)   Resp 14   Ht 5' 4.02" (1.626 m)   Wt 178 lb 6.4 oz (80.9 kg)   SpO2 96%   BMI  30.61 kg/m   Physical Exam HENT:     Head: Normocephalic and atraumatic.  Eyes:     Extraocular Movements: Extraocular movements intact.     Conjunctiva/sclera: Conjunctivae normal.     Pupils: Pupils are equal, round, and reactive to light.  Cardiovascular:     Rate and Rhythm: Normal rate and regular rhythm.     Pulses: Normal pulses.     Heart sounds: Normal heart sounds.  Pulmonary:     Effort: Pulmonary effort is normal.     Breath sounds: Normal breath sounds.  Musculoskeletal:        General: Normal range of motion.     Cervical back: Normal range of motion and neck supple.     Comments: No evidence of erythema, edema, tenderness, or warmth.  Skin:    General: Skin is warm and dry.  Neurological:     General: No focal deficit present.     Mental Status: She is alert and oriented to person, place, and time.  Psychiatric:        Mood and Affect: Mood normal.        Behavior: Behavior normal.   ASSESSMENT AND PLAN: 1. Muscle spasm: - Cyclobenzaprine discontinued per patient preference.  - Counseled that varicose veins may be a cause of leg discomfort. However, currently symptomology seems most related to nerve discomfort.  - No evidence of red flag symptoms present. - Referral to Neurology for further evaluation and management.  - May consider referral to Vascular Surgery as a next step if Neurology unable to determine cause of muscle spasms. Patient agreeable.  - Follow-up with primary provider as scheduled.  - Ambulatory referral to Neurology    Patient was given the opportunity to ask questions.  Patient verbalized understanding of the plan and was able to repeat key elements of the plan. Patient was given clear instructions to go to Emergency Department or return to medical center if symptoms don't improve, worsen, or new problems develop.The patient verbalized understanding.   Orders Placed This Encounter  Procedures   Ambulatory referral to Neurology      Lafayette Dunlevy Jodi Geralds, NP

## 2020-09-21 ENCOUNTER — Ambulatory Visit (INDEPENDENT_AMBULATORY_CARE_PROVIDER_SITE_OTHER): Payer: BC Managed Care – PPO | Admitting: Family

## 2020-09-21 ENCOUNTER — Other Ambulatory Visit: Payer: Self-pay

## 2020-09-21 ENCOUNTER — Encounter: Payer: Self-pay | Admitting: Family

## 2020-09-21 VITALS — BP 115/82 | HR 93 | Temp 98.1°F | Resp 14 | Ht 64.02 in | Wt 178.4 lb

## 2020-09-21 DIAGNOSIS — M62838 Other muscle spasm: Secondary | ICD-10-CM

## 2020-09-21 NOTE — Progress Notes (Signed)
Pt presents for bilateral leg spasms and restless feeling, pt states feels like she can feel the blood running through them

## 2020-10-31 ENCOUNTER — Telehealth: Payer: Self-pay | Admitting: Internal Medicine

## 2020-10-31 NOTE — Telephone Encounter (Signed)
Patient called in stating she needs a refill on her medication. Informed patient she needs to schedule an appointment to be seen since her PCP has left. Patient stated she had an appointment in July with Amy and she stated she told provider that she will be needing a refill on her ATIVAN. Patient did not want to schedule an appointment. Informed patient I would send message to nurse, and they may not fill it without an appointment.   LORazepam (ATIVAN) 0.5 MG tablet   Pharmacy  [id number] [place], [place] - [address]  [address] [place] [address]  Phone:  [phone]  Fax:  [phone]  DEA #:  [id number]

## 2020-11-01 NOTE — Telephone Encounter (Signed)
Called patient and lvm informing her that she needs to call the office and schedule an appointment with Dr. Andrey Campanile since Ricky Stabs NP does not prescribe benzodiazepines.

## 2020-11-14 ENCOUNTER — Other Ambulatory Visit: Payer: Self-pay

## 2020-11-14 ENCOUNTER — Encounter: Payer: Self-pay | Admitting: Family Medicine

## 2020-11-14 ENCOUNTER — Ambulatory Visit: Payer: BC Managed Care – PPO | Admitting: Family Medicine

## 2020-11-14 VITALS — BP 117/80 | HR 83 | Temp 98.1°F | Resp 16 | Ht 64.0 in | Wt 176.0 lb

## 2020-11-14 DIAGNOSIS — I10 Essential (primary) hypertension: Secondary | ICD-10-CM

## 2020-11-14 DIAGNOSIS — F411 Generalized anxiety disorder: Secondary | ICD-10-CM

## 2020-11-14 DIAGNOSIS — M62838 Other muscle spasm: Secondary | ICD-10-CM | POA: Diagnosis not present

## 2020-11-14 MED ORDER — METOPROLOL SUCCINATE ER 25 MG PO TB24: 25.0000 mg | ORAL_TABLET | Freq: Every day | ORAL | 0 refills | Status: DC

## 2020-11-14 MED ORDER — POTASSIUM CHLORIDE CRYS ER 10 MEQ PO TBCR: 10.0000 meq | EXTENDED_RELEASE_TABLET | Freq: Every day | ORAL | 0 refills | Status: DC

## 2020-11-14 MED ORDER — CLONAZEPAM 0.5 MG PO TABS: 0.5000 mg | ORAL_TABLET | Freq: Two times a day (BID) | ORAL | 0 refills | Status: DC | PRN

## 2020-11-14 NOTE — Progress Notes (Signed)
Patient is her for medication refill. Patient c/o legs hurting at night.

## 2020-11-15 NOTE — Progress Notes (Signed)
New Patient Office Visit  Subjective:  Patient ID: Cathy Cole, female    DOB: 12/25/1970  Age: 50 y.o. MRN: [id number]  CC:  Chief Complaint  Patient presents with   Medication Refill   Spasms    HPI [name] presents for follow up of hypertension and anxiety. Patient reports that she needs a refill of ativan which she uses on a prn basis only. She also complains of intermittent muscle spamsm. She is taking HCTZ without a potassium supplement.   Past Medical History:  Diagnosis Date   Abnormal uterine bleeding (AUB) 11/07/2014   Allergy    Anxiety    Hypertension    Menorrhagia 11/07/2014    Past Surgical History:  Procedure Laterality Date   BUNIONECTOMY     COSMETIC SURGERY     NOVASURE ABLATION N/A 11/08/2014   Procedure: NOVASURE ABLATION;  Surgeon: Sherian Rein, MD;  Location: [facility];  Service: Gynecology;  Laterality: N/A;   TUBAL LIGATION      Family History  Problem Relation Age of Onset   Diabetes Other    Hypertension Other    Hypertension Mother    Diabetes Maternal Grandmother    Hypertension Maternal Grandmother     Social History   Socioeconomic History   Marital status: Divorced    Spouse name: Not on file   Number of children: 3   Years of education: Not on file   Highest education level: Not on file  Occupational History   Not on file  Tobacco Use   Smoking status: Never   Smokeless tobacco: Never  Vaping Use   Vaping Use: Never used  Substance and Sexual Activity   Alcohol use: No   Drug use: Never   Sexual activity: Yes  Other Topics Concern   Not on file  Social History Narrative   Not on file   Social Determinants of Health   Financial Resource Strain: Not on file  Food Insecurity: Not on file  Transportation Needs: Not on file  Physical Activity: Not on file  Stress: Not on file  Social Connections: Not on file  Intimate Partner Violence: Not on file    ROS Review of Systems   Psychiatric/Behavioral:  Negative for self-injury, sleep disturbance and suicidal ideas. The patient is nervous/anxious.   All other systems reviewed and are negative.  Objective:   Today's Vitals: BP 117/80 (BP Location: Left Arm, Patient Position: Sitting, Cuff Size: Large)   Pulse 83   Temp 98.1 F (36.7 C) (Oral)   Resp 16   Ht 5\' 4"  (1.626 m)   Wt 176 lb (79.8 kg)   SpO2 99%   BMI 30.21 kg/m   Physical Exam Vitals and nursing note reviewed.  Constitutional:      General: She is not in acute distress. Cardiovascular:     Rate and Rhythm: Normal rate and regular rhythm.  Pulmonary:     Effort: Pulmonary effort is normal.     Breath sounds: Normal breath sounds.  Abdominal:     Tenderness: There is no abdominal tenderness.  Musculoskeletal:     Right lower leg: No edema.     Left lower leg: No edema.  Neurological:     General: No focal deficit present.     Mental Status: She is alert and oriented to person, place, and time.  Psychiatric:        Mood and Affect: Affect normal. Mood is anxious.  Behavior: Behavior normal.    Assessment & Plan:   1. Essential hypertension Continue present management. Meds refilled  - metoprolol succinate (TOPROL-XL) 25 MG 24 hr tablet; Take 1 tablet (25 mg total) by mouth daily.  Dispense: 90 tablet; Refill: 0  2. Generalized anxiety disorder Discussed at length with patient. Will not refill ativan. Patient prescribed clonazepam for prn usage only. Will monitor  3. Muscle spasm ? If Most l;ikely 2/2 HCTZ without supplemental  potassium. Will prescribed potassium and monitor    Outpatient Encounter Medications as of 11/14/2020  Medication Sig   acetaminophen (TYLENOL) 500 MG tablet Take 1,000 mg by mouth every 6 (six) hours as needed for mild pain, moderate pain or headache.   Cholecalciferol (VITAMIN D3) 2000 UNITS TABS Take 2,000 Units by mouth daily.    clonazePAM (KLONOPIN) 0.5 MG tablet Take 1 tablet (0.5 mg total)  by mouth 2 (two) times daily as needed for anxiety.   potassium chloride (KLOR-CON) 10 MEQ tablet Take 1 tablet (10 mEq total) by mouth daily.   [DISCONTINUED] LORazepam (ATIVAN) 0.5 MG tablet Take 1 tablet (0.5 mg total) by mouth daily as needed for anxiety.   [DISCONTINUED] metoprolol succinate (TOPROL-XL) 25 MG 24 hr tablet Take 1 tablet (25 mg total) by mouth daily.   metoprolol succinate (TOPROL-XL) 25 MG 24 hr tablet Take 1 tablet (25 mg total) by mouth daily.   valsartan-hydrochlorothiazide (DIOVAN HCT) 80-12.5 MG tablet Take 1 tablet by mouth daily.   [DISCONTINUED] methocarbamol (ROBAXIN) 500 MG tablet TAKE 1 TABLET BY MOUTH 4 TIMES DAILY.   No facility-administered encounter medications on file as of 11/14/2020.    Follow-up: Return in about 3 months (around 02/13/2021) for follow up.   Tommie Raymond, MD

## 2020-11-28 IMAGING — MG MM DIGITAL DIAGNOSTIC UNILAT*R* W/ TOMO W/ CAD
6 series · 6 of 18 positions shown · non-contrast
Comparison: Previous exam(s).

CLINICAL DATA: Screening recall for a possible right breast mass.

EXAM:
DIGITAL DIAGNOSTIC RIGHT MAMMOGRAM WITH CAD AND TOMO
ULTRASOUND RIGHT BREAST

[R CC synth-2D]
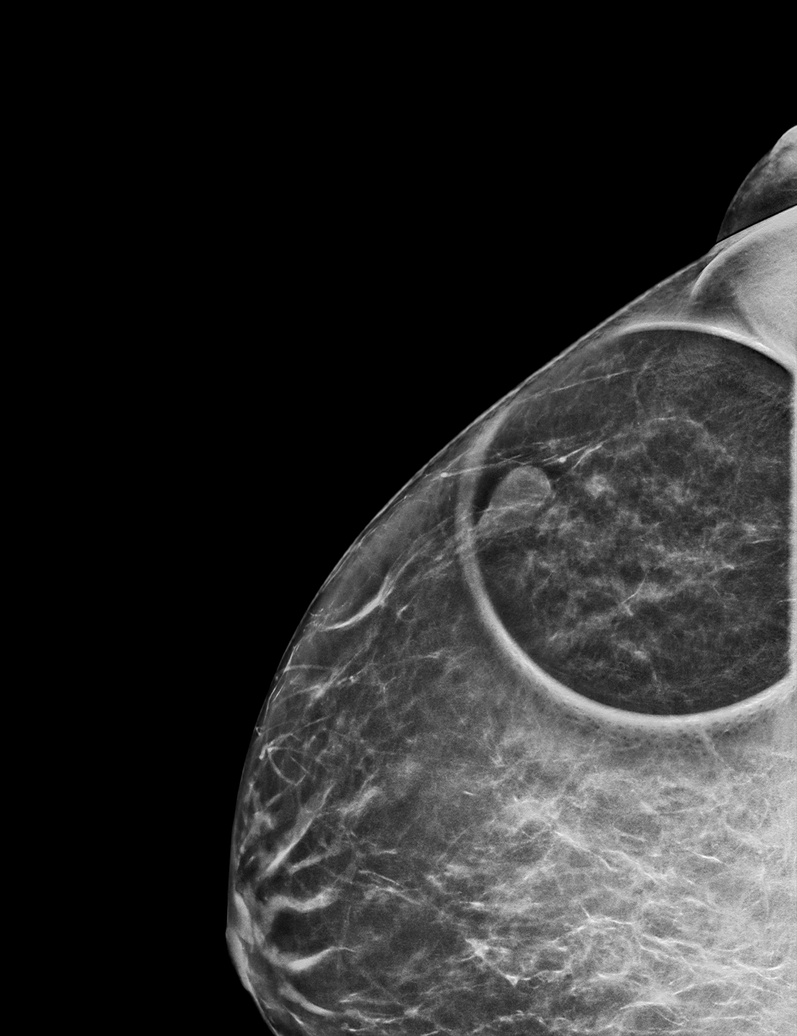

[R MLO synth-2D (1 of 2)]
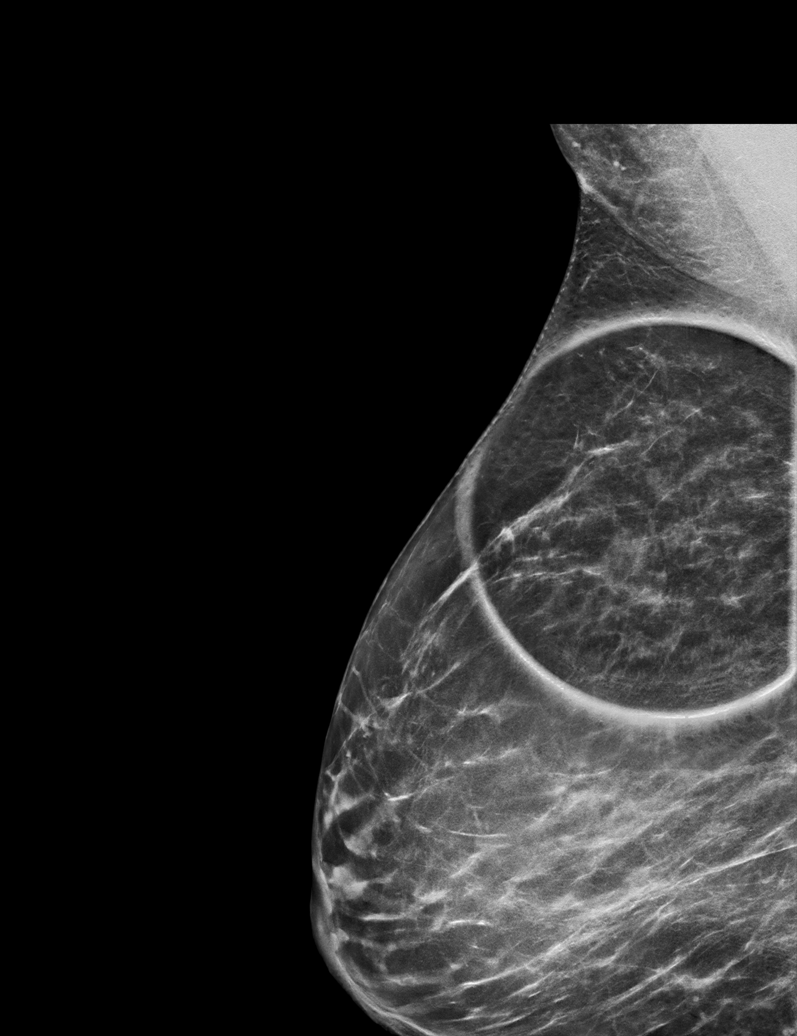

[R MLO synth-2D (2 of 2)]
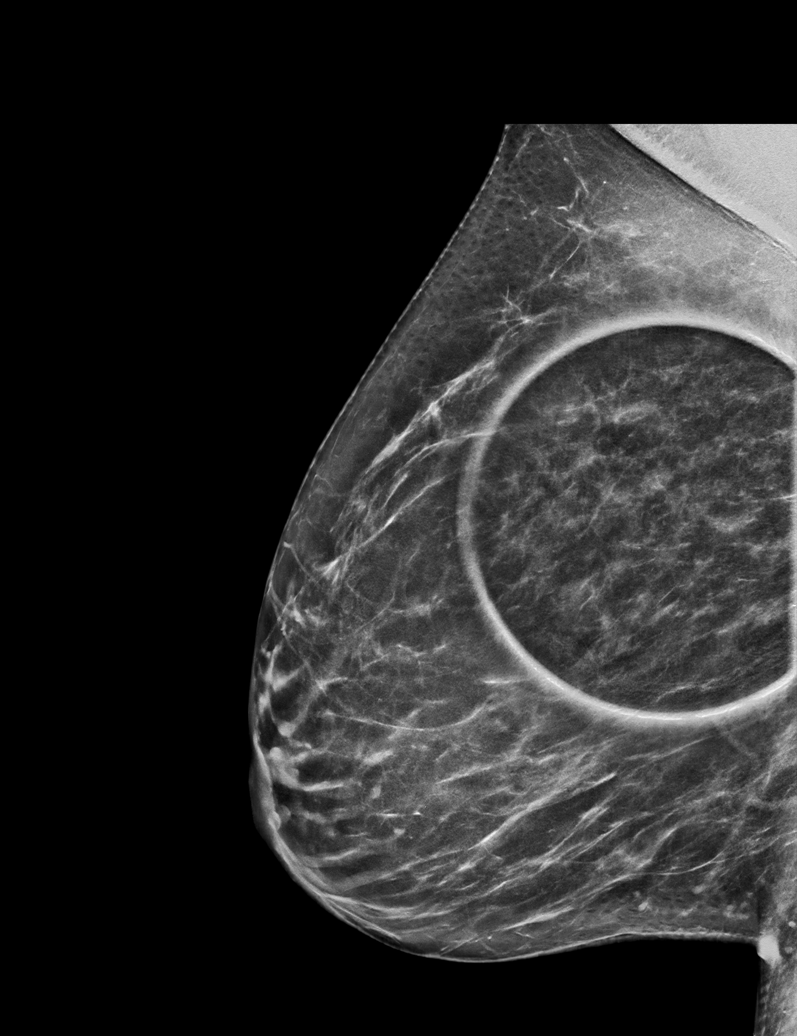

[R CC tomo · tomo slice 34/67.0]
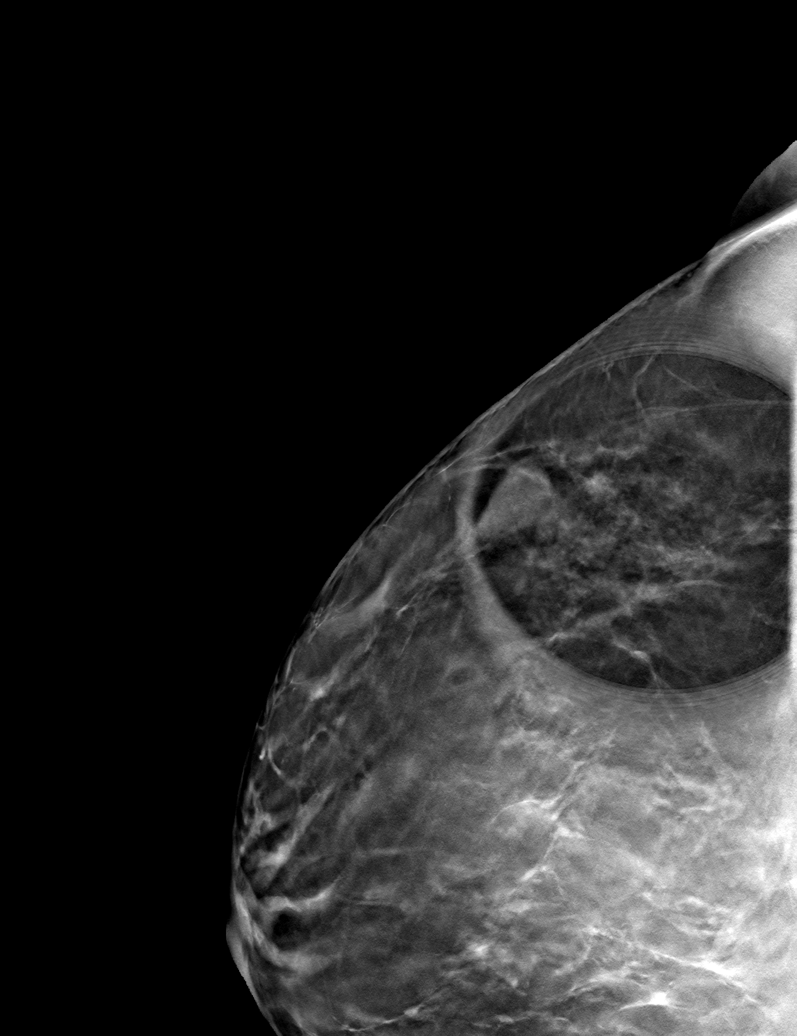

[R MLO tomo (1 of 2) · tomo slice 35/68.0]
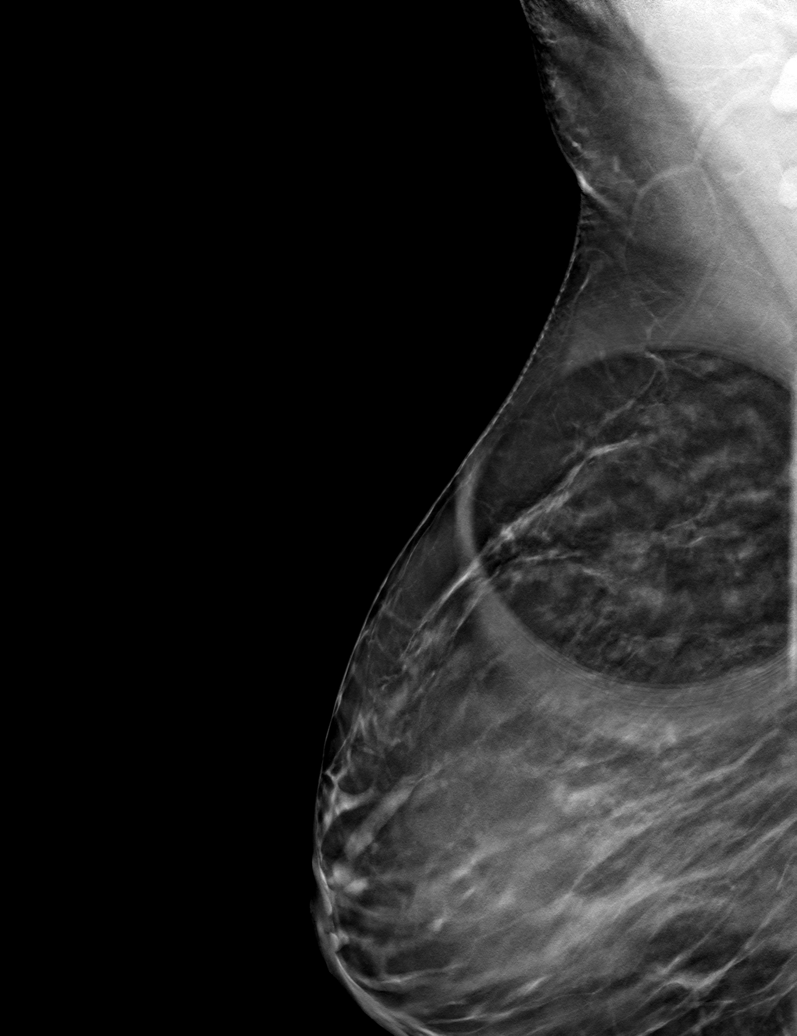

[R MLO tomo (2 of 2) · tomo slice 35/68.0]
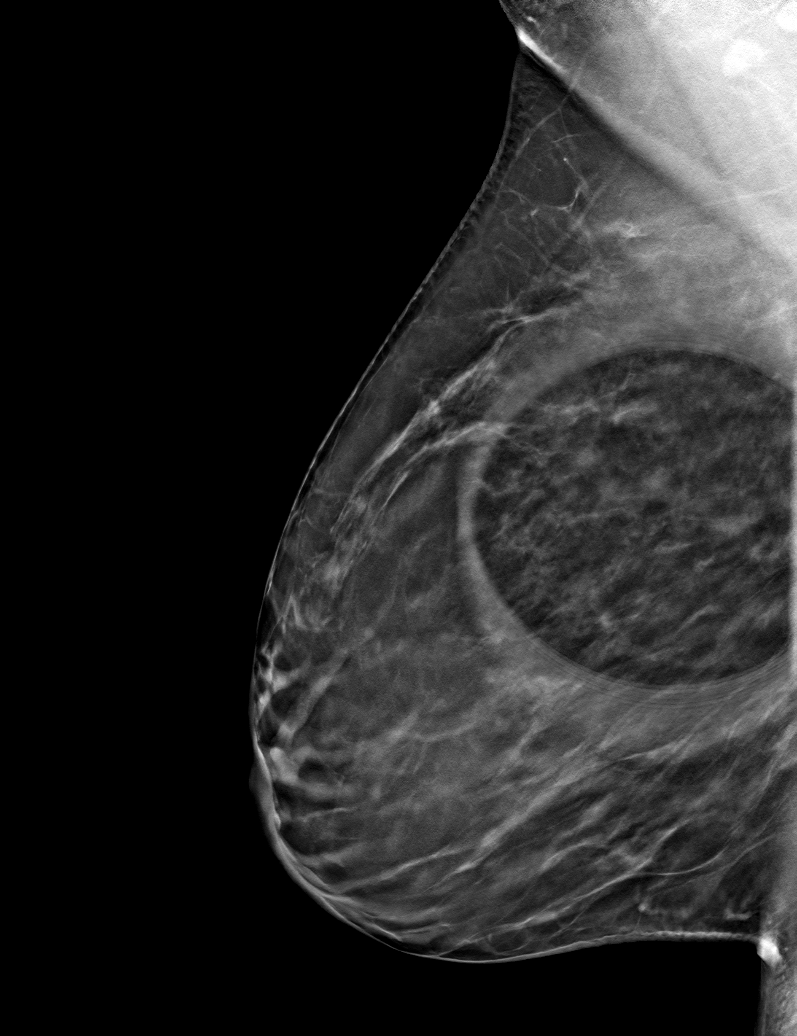

[6 of 18 positions shown; findings below may reference images not displayed]

ACR Breast Density Category c: The breast tissue is heterogeneously
dense, which may obscure small masses.
FINDINGS: Spot compression tomosynthesis images reveal a persistent oval
obscured mass in the upper-outer quadrant of the right breast.

Mammographic images were processed with CAD.

Ultrasound of the right breast at 930, 7 cm from the nipple
demonstrates an anechoic oval circumscribed mass measuring 1.9 x
x 1.4 cm.
IMPRESSION: The mass in the upper-outer quadrant of the right breast corresponds
with a benign cyst.

RECOMMENDATION:
Screening mammogram in one year.(Code:16-K-2U9)

I have discussed the findings and recommendations with the patient.
If applicable, a reminder letter will be sent to the patient
regarding the next appointment.

BI-RADS CATEGORY  2: Benign.

## 2020-12-05 DIAGNOSIS — Z9089 Acquired absence of other organs: Secondary | ICD-10-CM | POA: Insufficient documentation

## 2020-12-05 DIAGNOSIS — Z9622 Myringotomy tube(s) status: Secondary | ICD-10-CM | POA: Diagnosis not present

## 2020-12-05 DIAGNOSIS — H6982 Other specified disorders of Eustachian tube, left ear: Secondary | ICD-10-CM | POA: Diagnosis not present

## 2020-12-21 DIAGNOSIS — N951 Menopausal and female climacteric states: Secondary | ICD-10-CM | POA: Diagnosis not present

## 2020-12-21 DIAGNOSIS — N8189 Other female genital prolapse: Secondary | ICD-10-CM | POA: Diagnosis not present

## 2020-12-21 DIAGNOSIS — N898 Other specified noninflammatory disorders of vagina: Secondary | ICD-10-CM | POA: Diagnosis not present

## 2020-12-24 ENCOUNTER — Ambulatory Visit: Payer: BC Managed Care – PPO | Admitting: Neurology

## 2021-01-01 ENCOUNTER — Encounter: Payer: Self-pay | Admitting: Neurology

## 2021-01-01 ENCOUNTER — Ambulatory Visit (INDEPENDENT_AMBULATORY_CARE_PROVIDER_SITE_OTHER): Payer: BC Managed Care – PPO | Admitting: Neurology

## 2021-01-01 ENCOUNTER — Other Ambulatory Visit: Payer: Self-pay

## 2021-01-01 VITALS — BP 114/74 | HR 83 | Ht 64.0 in | Wt 182.5 lb

## 2021-01-01 DIAGNOSIS — R253 Fasciculation: Secondary | ICD-10-CM | POA: Diagnosis not present

## 2021-01-01 DIAGNOSIS — R202 Paresthesia of skin: Secondary | ICD-10-CM | POA: Diagnosis not present

## 2021-01-01 NOTE — Progress Notes (Signed)
Chief Complaint  Patient presents with   New Patient (Initial Visit)    Room 13. Pt alone.      ASSESSMENT AND PLAN  Cathy Cole is a 50 y.o. female   Intermittent bilateral lower extremity muscle fasciculation, Paresthesia  Essentially normal neurological examinations,  Above complaints has no limitation in her daily function, she denies significant pain, Laboratory evaluation Including TSH, CPK to rule out intrinsic muscle disease, thyroid malfunction, Continue to observe her symptoms, She will likely benefit better control of her anxiety   DIAGNOSTIC DATA (LABS, IMAGING, TESTING) - I reviewed patient records, labs, notes, testing and imaging myself where available.   MEDICAL HISTORY:  Cathy Cole, is a [age] year old female, seen in request by her primary care nurse practitioner Rema Fendt, and Dr. Georganna Skeans, for evaluation of bilateral lower extremity muscle spasm, paresthesia, initial evaluation was January 01, 2021  I reviewed and summarized the referring note. PMHX. HTN Anxiety,   She had long history of anxiety, previously was only taking Ativan as needed, not taking clonazepam as needed, never had regular persistent treatment  Since January 2022, she began to notice intermittent bilateral calf muscle twitching, not painful, no limitation in her daily activity, shortly afterwards, she began to felt cold and sensation in her lower extremity, as if blood flowing down her legs, she denies difficulty moving, denies bowel and bladder incontinence, mild midline low back pain, no upper extremity symptoms  Laboratory evaluation December 2021, normal lipid panel, CMP, A1c, Lorazepam 2012, clonazepam 0.5mg  qhs, CBC showed mild elevated WBC of 11, hemoglobin of 14,  May 2021, she had more extensive laboratory evaluation, was normal B12 vitamin D, CPK,   PHYSICAL EXAM:   Vitals:   01/01/21 1514  BP: 114/74  Pulse: 83  Weight: 182 lb 8 oz (82.8 kg)   Height: 5\' 4"  (1.626 m)   Not recorded     Body mass index is 31.33 kg/m.  PHYSICAL EXAMNIATION:  Gen: NAD, conversant, well nourised, well groomed                     Cardiovascular: Regular rate rhythm, no peripheral edema, warm, nontender. Eyes: Conjunctivae clear without exudates or hemorrhage Neck: Supple, no carotid bruits. Pulmonary: Clear to auscultation bilaterally   NEUROLOGICAL EXAM:  MENTAL STATUS: Speech:    Speech is normal; fluent and spontaneous with normal comprehension.  Cognition:     Orientation to time, place and person     Normal recent and remote memory     Normal Attention span and concentration     Normal Language, naming, repeating,spontaneous speech     Fund of knowledge   CRANIAL NERVES: CN II: Visual fields are full to confrontation. Pupils are round equal and briskly reactive to light. CN III, IV, VI: extraocular movement are normal. No ptosis. CN V: Facial sensation is intact to light touch CN VII: Face is symmetric with normal eye closure  CN VIII: Hearing is normal to causal conversation. CN IX, X: Phonation is normal. CN XI: Head turning and shoulder shrug are intact  MOTOR: There is no pronator drift of out-stretched arms. Muscle bulk and tone are normal. Muscle strength is normal.  REFLEXES: Reflexes are 2+ and symmetric at the biceps, triceps, knees, and ankles. Plantar responses are flexor.  SENSORY: Intact to light touch, pinprick and vibratory sensation are intact in fingers and toes.  COORDINATION: There is no trunk or limb dysmetria noted.  GAIT/STANCE: Posture is normal.  Gait is steady with normal steps, base, arm swing, and turning. Heel and toe walking are normal. Tandem gait is normal.  Romberg is absent.  REVIEW OF SYSTEMS:  Full 14 system review of systems performed and notable only for as above All other review of systems were negative.   ALLERGIES: Allergies  Allergen Reactions   Bactrim  [Sulfamethoxazole-Trimethoprim] Anaphylaxis    Chest pain   Other Anaphylaxis    HOME MEDICATIONS: Current Outpatient Medications  Medication Sig Dispense Refill   acetaminophen (TYLENOL) 500 MG tablet Take 1,000 mg by mouth every 6 (six) hours as needed for mild pain, moderate pain or headache.     Cholecalciferol (VITAMIN D3) 2000 UNITS TABS Take 2,000 Units by mouth daily.      clonazePAM (KLONOPIN) 0.5 MG tablet Take 1 tablet (0.5 mg total) by mouth 2 (two) times daily as needed for anxiety. 30 tablet 0   metoprolol succinate (TOPROL-XL) 25 MG 24 hr tablet Take 1 tablet (25 mg total) by mouth daily. 90 tablet 0   potassium chloride (KLOR-CON) 10 MEQ tablet Take 1 tablet (10 mEq total) by mouth daily. 90 tablet 0   valsartan-hydrochlorothiazide (DIOVAN HCT) 80-12.5 MG tablet Take 1 tablet by mouth daily. 90 tablet 3   No current facility-administered medications for this visit.    PAST MEDICAL HISTORY: Past Medical History:  Diagnosis Date   Abnormal uterine bleeding (AUB) 11/07/2014   Allergy    Anxiety    Hypertension    Menorrhagia 11/07/2014    PAST SURGICAL HISTORY: Past Surgical History:  Procedure Laterality Date   BUNIONECTOMY     COSMETIC SURGERY     NOVASURE ABLATION N/A 11/08/2014   Procedure: NOVASURE ABLATION;  Surgeon: Sherian Rein, MD;  Location: [facility];  Service: Gynecology;  Laterality: N/A;   TUBAL LIGATION      FAMILY HISTORY: Family History  Problem Relation Age of Onset   Diabetes Other    Hypertension Other    Hypertension Mother    Diabetes Maternal Grandmother    Hypertension Maternal Grandmother     SOCIAL HISTORY: Social History   Socioeconomic History   Marital status: Divorced    Spouse name: Not on file   Number of children: 3   Years of education: Not on file   Highest education level: Not on file  Occupational History   Not on file  Tobacco Use   Smoking status: Never   Smokeless tobacco: Never  Vaping Use   Vaping  Use: Never used  Substance and Sexual Activity   Alcohol use: No   Drug use: Never   Sexual activity: Yes  Other Topics Concern   Not on file  Social History Narrative   Not on file   Social Determinants of Health   Financial Resource Strain: Not on file  Food Insecurity: Not on file  Transportation Needs: Not on file  Physical Activity: Not on file  Stress: Not on file  Social Connections: Not on file  Intimate Partner Violence: Not on file      Levert Feinstein, M.D. Ph.D.  [facility] [address] [place], [place] [address] Ph: [phone] Fax: [phone]  CC:  Rema Fendt, NP [address] [place],  [place] [address]  Georganna Skeans, MD

## 2021-01-02 LAB — CBC WITH DIFFERENTIAL/PLATELET
Basophils Absolute: 0 10*3/uL (ref 0.0–0.2)
Basos: 1 %
EOS (ABSOLUTE): 0.3 10*3/uL (ref 0.0–0.4)
Eos: 4 %
Hematocrit: 40.9 % (ref 34.0–46.6)
Hemoglobin: 13.6 g/dL (ref 11.1–15.9)
Immature Grans (Abs): 0 10*3/uL (ref 0.0–0.1)
Immature Granulocytes: 0 %
Lymphocytes Absolute: 3.9 10*3/uL — ABNORMAL HIGH (ref 0.7–3.1)
Lymphs: 43 %
MCH: 29.2 pg (ref 26.6–33.0)
MCHC: 33.3 g/dL (ref 31.5–35.7)
MCV: 88 fL (ref 79–97)
Monocytes Absolute: 0.6 10*3/uL (ref 0.1–0.9)
Monocytes: 7 %
Neutrophils Absolute: 4 10*3/uL (ref 1.4–7.0)
Neutrophils: 45 %
Platelets: 315 10*3/uL (ref 150–450)
RBC: 4.65 x10E6/uL (ref 3.77–5.28)
RDW: 12.5 % (ref 11.7–15.4)
WBC: 8.9 10*3/uL (ref 3.4–10.8)

## 2021-01-02 LAB — COMPREHENSIVE METABOLIC PANEL
ALT: 14 IU/L (ref 0–32)
AST: 25 IU/L (ref 0–40)
Albumin/Globulin Ratio: 1.3 (ref 1.2–2.2)
Albumin: 4.4 g/dL (ref 3.8–4.8)
Alkaline Phosphatase: 102 IU/L (ref 44–121)
BUN/Creatinine Ratio: 18 (ref 9–23)
BUN: 13 mg/dL (ref 6–24)
Bilirubin Total: 0.5 mg/dL (ref 0.0–1.2)
CO2: 19 mmol/L — ABNORMAL LOW (ref 20–29)
Calcium: 9.5 mg/dL (ref 8.7–10.2)
Chloride: 101 mmol/L (ref 96–106)
Creatinine, Ser: 0.74 mg/dL (ref 0.57–1.00)
Globulin, Total: 3.3 g/dL (ref 1.5–4.5)
Glucose: 81 mg/dL (ref 70–99)
Potassium: 4.9 mmol/L (ref 3.5–5.2)
Sodium: 141 mmol/L (ref 134–144)
Total Protein: 7.7 g/dL (ref 6.0–8.5)
eGFR: 99 mL/min/{1.73_m2} (ref >59)

## 2021-01-02 LAB — LIPID PANEL
Chol/HDL Ratio: 2.3 ratio (ref 0.0–4.4)
Cholesterol, Total: 129 mg/dL (ref 100–199)
HDL: 57 mg/dL (ref >39)
LDL Chol Calc (NIH): 52 mg/dL (ref 0–99)
Triglycerides: 111 mg/dL (ref 0–149)
VLDL Cholesterol Cal: 20 mg/dL (ref 5–40)

## 2021-01-02 LAB — TSH: TSH: 1.13 u[IU]/mL (ref 0.450–4.500)

## 2021-01-02 LAB — CK: Total CK: 67 U/L (ref 32–182)

## 2021-01-15 ENCOUNTER — Ambulatory Visit: Payer: BC Managed Care – PPO | Admitting: Neurology

## 2021-02-01 ENCOUNTER — Telehealth: Payer: Self-pay | Admitting: Family Medicine

## 2021-02-01 NOTE — Telephone Encounter (Signed)
Patient requesting to reschedule cpe w/ Dr. Alvy Bimler  Informed patient Alvy Bimler was no her current pcp, patient states appointment was made prior to Marcy Siren becoming her pcp  Patient states Earlene Plater is no longer her pcp and would like to keep her cpe appt w/ Sagardia  Please advise

## 2021-02-06 ENCOUNTER — Telehealth: Payer: Self-pay | Admitting: Family Medicine

## 2021-02-06 NOTE — Telephone Encounter (Signed)
Patient requesting order for labs prior to cpe appt on 03-20-2021

## 2021-02-11 ENCOUNTER — Other Ambulatory Visit: Payer: Self-pay | Admitting: Family Medicine

## 2021-02-11 DIAGNOSIS — I10 Essential (primary) hypertension: Secondary | ICD-10-CM

## 2021-02-12 ENCOUNTER — Other Ambulatory Visit: Payer: Self-pay | Admitting: Family Medicine

## 2021-02-12 DIAGNOSIS — Z01419 Encounter for gynecological examination (general) (routine) without abnormal findings: Secondary | ICD-10-CM

## 2021-02-15 ENCOUNTER — Other Ambulatory Visit: Payer: Self-pay

## 2021-02-15 ENCOUNTER — Encounter: Payer: Self-pay | Admitting: Family Medicine

## 2021-02-15 ENCOUNTER — Ambulatory Visit: Payer: BC Managed Care – PPO | Admitting: Family Medicine

## 2021-02-15 VITALS — BP 134/86 | HR 68 | Temp 98.2°F | Resp 16 | Wt 190.4 lb

## 2021-02-15 DIAGNOSIS — F411 Generalized anxiety disorder: Secondary | ICD-10-CM

## 2021-02-15 DIAGNOSIS — I10 Essential (primary) hypertension: Secondary | ICD-10-CM

## 2021-02-15 DIAGNOSIS — M25562 Pain in left knee: Secondary | ICD-10-CM

## 2021-02-15 NOTE — Progress Notes (Signed)
Patient would like to discuss her knee pain. Pain has been on and off for awhile.

## 2021-02-15 NOTE — Progress Notes (Signed)
Established  Patient Office Visit  Subjective:  Patient ID: Cathy Cole, female    DOB: February 23, 1971  Age: 50 y.o. MRN: [id number]  CC:  Chief Complaint  Patient presents with   Follow-up   Knee Pain    HPI Cathy Cole presents for complaint of persistent and intermittent left knee pain. Patient denies known trauma or injury.   Past Medical History:  Diagnosis Date   Abnormal uterine bleeding (AUB) 11/07/2014   Allergy    Anxiety    Hypertension    Menorrhagia 11/07/2014    Past Surgical History:  Procedure Laterality Date   BUNIONECTOMY     COSMETIC SURGERY     NOVASURE ABLATION N/A 11/08/2014   Procedure: NOVASURE ABLATION;  Surgeon: Sherian Rein, MD;  Location: [facility];  Service: Gynecology;  Laterality: N/A;   TUBAL LIGATION      Family History  Problem Relation Age of Onset   Diabetes Other    Hypertension Other    Hypertension Mother    Diabetes Maternal Grandmother    Hypertension Maternal Grandmother     Social History   Socioeconomic History   Marital status: Divorced    Spouse name: Not on file   Number of children: 3   Years of education: Not on file   Highest education level: Not on file  Occupational History   Not on file  Tobacco Use   Smoking status: Never   Smokeless tobacco: Never  Vaping Use   Vaping Use: Never used  Substance and Sexual Activity   Alcohol use: No   Drug use: Never   Sexual activity: Yes  Other Topics Concern   Not on file  Social History Narrative   Not on file   Social Determinants of Health   Financial Resource Strain: Not on file  Food Insecurity: Not on file  Transportation Needs: Not on file  Physical Activity: Not on file  Stress: Not on file  Social Connections: Not on file  Intimate Partner Violence: Not on file    ROS Review of Systems  All other systems reviewed and are negative.  Objective:   Today's Vitals: BP 134/86   Pulse 68   Temp 98.2 F (36.8 C) (Oral)   Resp 16    Wt 190 lb 6.4 oz (86.4 kg)   SpO2 95%   BMI 32.68 kg/m   Physical Exam Vitals and nursing note reviewed.  Constitutional:      General: She is not in acute distress. Cardiovascular:     Rate and Rhythm: Normal rate and regular rhythm.  Pulmonary:     Effort: Pulmonary effort is normal.     Breath sounds: Normal breath sounds.  Abdominal:     Tenderness: There is no abdominal tenderness.  Musculoskeletal:     Right lower leg: No edema.     Left lower leg: No edema.  Neurological:     General: No focal deficit present.     Mental Status: She is alert and oriented to person, place, and time.  Psychiatric:        Mood and Affect: Mood and affect normal.        Behavior: Behavior normal.    Assessment & Plan:   1. Left knee pain, unspecified chronicity Discussed care. Utilize splint, tylenol/nsaids/topical preps prn. Exercises given. Will monitor  2. Essential hypertension Continue present management and monitor  3. Generalized anxiety disorder Improved. Continue present management    Outpatient Encounter Medications as of 02/15/2021  Medication  Sig   acetaminophen (TYLENOL) 500 MG tablet Take 1,000 mg by mouth every 6 (six) hours as needed for mild pain, moderate pain or headache.   Cholecalciferol (VITAMIN D3) 2000 UNITS TABS Take 2,000 Units by mouth daily.    clonazePAM (KLONOPIN) 0.5 MG tablet Take 1 tablet (0.5 mg total) by mouth 2 (two) times daily as needed for anxiety.   metoprolol succinate (TOPROL-XL) 25 MG 24 hr tablet TAKE 1 TABLET (25 MG TOTAL) BY MOUTH DAILY.   potassium chloride (KLOR-CON) 10 MEQ tablet Take 1 tablet (10 mEq total) by mouth daily.   valsartan-hydrochlorothiazide (DIOVAN HCT) 80-12.5 MG tablet Take 1 tablet by mouth daily.   No facility-administered encounter medications on file as of 02/15/2021.    Follow-up: Return in about 3 months (around 05/16/2021) for follow up, chronic med issues.   Cathy Raymond, MD

## 2021-02-26 ENCOUNTER — Encounter: Payer: Self-pay | Admitting: Emergency Medicine

## 2021-03-13 ENCOUNTER — Other Ambulatory Visit: Payer: Self-pay | Admitting: Family Medicine

## 2021-03-20 ENCOUNTER — Ambulatory Visit (INDEPENDENT_AMBULATORY_CARE_PROVIDER_SITE_OTHER): Payer: BC Managed Care – PPO | Admitting: Emergency Medicine

## 2021-03-20 ENCOUNTER — Encounter: Payer: Self-pay | Admitting: Emergency Medicine

## 2021-03-20 ENCOUNTER — Telehealth: Payer: Self-pay | Admitting: Family Medicine

## 2021-03-20 ENCOUNTER — Other Ambulatory Visit: Payer: Self-pay

## 2021-03-20 VITALS — BP 128/76 | HR 94 | Ht 64.0 in | Wt 192.0 lb

## 2021-03-20 DIAGNOSIS — Z0000 Encounter for general adult medical examination without abnormal findings: Secondary | ICD-10-CM

## 2021-03-20 DIAGNOSIS — I10 Essential (primary) hypertension: Secondary | ICD-10-CM | POA: Diagnosis not present

## 2021-03-20 DIAGNOSIS — R233 Spontaneous ecchymoses: Secondary | ICD-10-CM | POA: Diagnosis not present

## 2021-03-20 DIAGNOSIS — Z1211 Encounter for screening for malignant neoplasm of colon: Secondary | ICD-10-CM | POA: Diagnosis not present

## 2021-03-20 MED ORDER — VALSARTAN-HYDROCHLOROTHIAZIDE 80-12.5 MG PO TABS: 1.0000 | ORAL_TABLET | Freq: Every day | ORAL | 3 refills | Status: DC

## 2021-03-20 NOTE — Progress Notes (Signed)
Cathy Cole 51 y.o.   Chief Complaint  Patient presents with   Annual Exam    HISTORY OF PRESENT ILLNESS: This is a 51 y.o. female here for annual exam. History of hypertension on losartan-hydrochlorothiazide.  Needs medication refill. Noticed easy bruising with atraumatic ecchymotic areas over the past 6 months on and off. Scheduled to see gynecologist next Friday. No other complaints or medical concerns today. Needs colonoscopy referral. Recent blood work done last October within normal limits.  HPI   Prior to Admission medications   Medication Sig Start Date End Date Taking? Authorizing Provider  acetaminophen (TYLENOL) 500 MG tablet Take 1,000 mg by mouth every 6 (six) hours as needed for mild pain, moderate pain or headache.   Yes [provider]  clonazePAM (KLONOPIN) 0.5 MG tablet Take 1 tablet (0.5 mg total) by mouth 2 (two) times daily as needed for anxiety. 11/14/20  Yes Dorna Mai, MD  metoprolol succinate (TOPROL-XL) 25 MG 24 hr tablet TAKE 1 TABLET (25 MG TOTAL) BY MOUTH DAILY. 02/11/21  Yes Dorna Mai, MD  valsartan-hydrochlorothiazide (DIOVAN HCT) 80-12.5 MG tablet Take 1 tablet by mouth daily. 05/07/20 03/20/21 Yes Rolen Conger, Ines Bloomer, MD  Cholecalciferol (VITAMIN D3) 2000 UNITS TABS Take 2,000 Units by mouth daily.     [provider]  potassium chloride (KLOR-CON) 10 MEQ tablet Take 1 tablet (10 mEq total) by mouth daily. 11/14/20   Dorna Mai, MD    Allergies  Allergen Reactions   Bactrim [Sulfamethoxazole-Trimethoprim] Anaphylaxis    Chest pain   Other Anaphylaxis    Patient Active Problem List   Diagnosis Date Noted   Paresthesia 01/01/2021   Fasciculation 01/01/2021   Essential hypertension 08/10/2018   Generalized anxiety disorder 05/10/2018    Past Medical History:  Diagnosis Date   Abnormal uterine bleeding (AUB) 11/07/2014   Allergy    Anxiety    Hypertension    Menorrhagia 11/07/2014    Past Surgical  History:  Procedure Laterality Date   BUNIONECTOMY     COSMETIC SURGERY     NOVASURE ABLATION N/A 11/08/2014   Procedure: NOVASURE ABLATION;  Surgeon: Janyth Contes, MD;  Location: [facility];  Service: Gynecology;  Laterality: N/A;   TUBAL LIGATION      Social History   Socioeconomic History   Marital status: Divorced    Spouse name: Not on file   Number of children: 3   Years of education: Not on file   Highest education level: Not on file  Occupational History   Not on file  Tobacco Use   Smoking status: Never   Smokeless tobacco: Never  Vaping Use   Vaping Use: Never used  Substance and Sexual Activity   Alcohol use: No   Drug use: Never   Sexual activity: Yes  Other Topics Concern   Not on file  Social History Narrative   Not on file   Social Determinants of Health   Financial Resource Strain: Not on file  Food Insecurity: Not on file  Transportation Needs: Not on file  Physical Activity: Not on file  Stress: Not on file  Social Connections: Not on file  Intimate Partner Violence: Not on file    Family History  Problem Relation Age of Onset   Diabetes Other    Hypertension Other    Hypertension Mother    Diabetes Maternal Grandmother    Hypertension Maternal Grandmother      Review of Systems  Constitutional: Negative.  Negative for chills and fever.  HENT: Negative.  Negative for congestion and sore throat.   Respiratory: Negative.  Negative for cough and shortness of breath.   Cardiovascular: Negative.  Negative for chest pain and palpitations.  Gastrointestinal: Negative.  Negative for abdominal pain, diarrhea, nausea and vomiting.  Genitourinary: Negative.  Negative for dysuria and hematuria.  Skin: Negative.  Negative for rash.       Easy bruising  Neurological: Negative.  Negative for dizziness and headaches.  All other systems reviewed and are negative.  Today's Vitals   03/20/21 1359  BP: 128/76  Pulse: 94  SpO2: 94%  Weight: 192  lb (87.1 kg)  Height: 5\' 4"  (1.626 m)   Body mass index is 32.96 kg/m. Wt Readings from Last 3 Encounters:  03/20/21 192 lb (87.1 kg)  02/15/21 190 lb 6.4 oz (86.4 kg)  01/01/21 182 lb 8 oz (82.8 kg)    Physical Exam Vitals reviewed.  Constitutional:      Appearance: Normal appearance.  HENT:     Head: Normocephalic.     Right Ear: Tympanic membrane, ear canal and external ear normal.     Left Ear: Tympanic membrane, ear canal and external ear normal.     Mouth/Throat:     Mouth: Mucous membranes are moist.     Pharynx: Oropharynx is clear.  Eyes:     Extraocular Movements: Extraocular movements intact.     Conjunctiva/sclera: Conjunctivae normal.     Pupils: Pupils are equal, round, and reactive to light.  Cardiovascular:     Rate and Rhythm: Normal rate and regular rhythm.     Pulses: Normal pulses.     Heart sounds: Normal heart sounds.  Pulmonary:     Effort: Pulmonary effort is normal.     Breath sounds: Normal breath sounds.  Abdominal:     General: There is no distension.     Palpations: Abdomen is soft. There is no mass.     Tenderness: There is no abdominal tenderness.  Musculoskeletal:        General: Normal range of motion.     Cervical back: Normal range of motion and neck supple. No tenderness.  Lymphadenopathy:     Cervical: No cervical adenopathy.  Skin:    General: Skin is warm and dry.     Capillary Refill: Capillary refill takes less than 2 seconds.  Neurological:     General: No focal deficit present.     Mental Status: She is alert and oriented to person, place, and time.  Psychiatric:        Mood and Affect: Mood normal.        Behavior: Behavior normal.     ASSESSMENT & PLAN: Problem List Items Addressed This Visit       Cardiovascular and Mediastinum   Essential hypertension   Relevant Medications   valsartan-hydrochlorothiazide (DIOVAN HCT) 80-12.5 MG tablet   Other Visit Diagnoses     Routine general medical examination at a  health care facility    -  Primary   Easy bruisability       Relevant Orders   Ambulatory referral to Hematology / Oncology   Colon cancer screening       Relevant Orders   Ambulatory referral to Gastroenterology      Modifiable risk factors discussed with patient. Anticipatory guidance according to age provided. The following topics were also discussed: Social Determinants of Health Smoking.  Non-smoker Diet and nutrition and need to decrease amount of daily carbohydrate intake Benefits of exercise  Cancer screening and need for colon cancer screening with colonoscopy Schedule for mammogram in the next 2 weeks Vaccinations recommendations Cardiovascular risk assessment Hypertension management Differential diagnosis of easy bruising and need for hematology evaluation Mental health including depression and anxiety Fall and accident prevention  Patient Instructions  Health Maintenance, Female Adopting a healthy lifestyle and getting preventive care are important in promoting health and wellness. Ask your health care provider about: The right schedule for you to have regular tests and exams. Things you can do on your own to prevent diseases and keep yourself healthy. What should I know about diet, weight, and exercise? Eat a healthy diet  Eat a diet that includes plenty of vegetables, fruits, low-fat dairy products, and lean protein. Do not eat a lot of foods that are high in solid fats, added sugars, or sodium. Maintain a healthy weight Body mass index (BMI) is used to identify weight problems. It estimates body fat based on height and weight. Your health care provider can help determine your BMI and help you achieve or maintain a healthy weight. Get regular exercise Get regular exercise. This is one of the most important things you can do for your health. Most adults should: Exercise for at least 150 minutes each week. The exercise should increase your heart rate and make you  sweat (moderate-intensity exercise). Do strengthening exercises at least twice a week. This is in addition to the moderate-intensity exercise. Spend less time sitting. Even light physical activity can be beneficial. Watch cholesterol and blood lipids Have your blood tested for lipids and cholesterol at 50 years of age, then have this test every 5 years. Have your cholesterol levels checked more often if: Your lipid or cholesterol levels are high. You are older than 50 years of age. You are at high risk for heart disease. What should I know about cancer screening? Depending on your health history and family history, you may need to have cancer screening at various ages. This may include screening for: Breast cancer. Cervical cancer. Colorectal cancer. Skin cancer. Lung cancer. What should I know about heart disease, diabetes, and high blood pressure? Blood pressure and heart disease High blood pressure causes heart disease and increases the risk of stroke. This is more likely to develop in people who have high blood pressure readings or are overweight. Have your blood pressure checked: Every 3-5 years if you are 54-56 years of age. Every year if you are 58 years old or older. Diabetes Have regular diabetes screenings. This checks your fasting blood sugar level. Have the screening done: Once every three years after age 24 if you are at a normal weight and have a low risk for diabetes. More often and at a younger age if you are overweight or have a high risk for diabetes. What should I know about preventing infection? Hepatitis B If you have a higher risk for hepatitis B, you should be screened for this virus. Talk with your health care provider to find out if you are at risk for hepatitis B infection. Hepatitis C Testing is recommended for: Everyone born from [birth year] through 1964. Anyone with known risk factors for hepatitis C. Sexually transmitted infections (STIs) Get screened for  STIs, including gonorrhea and chlamydia, if: You are sexually active and are younger than 50 years of age. You are older than 50 years of age and your health care provider tells you that you are at risk for this type of infection. Your sexual activity has changed since you  were last screened, and you are at increased risk for chlamydia or gonorrhea. Ask your health care provider if you are at risk. Ask your health care provider about whether you are at high risk for HIV. Your health care provider may recommend a prescription medicine to help prevent HIV infection. If you choose to take medicine to prevent HIV, you should first get tested for HIV. You should then be tested every 3 months for as long as you are taking the medicine. Pregnancy If you are about to stop having your period (premenopausal) and you may become pregnant, seek counseling before you get pregnant. Take 400 to 800 micrograms (mcg) of folic acid every day if you become pregnant. Ask for birth control (contraception) if you want to prevent pregnancy. Osteoporosis and menopause Osteoporosis is a disease in which the bones lose minerals and strength with aging. This can result in bone fractures. If you are 51 years old or older, or if you are at risk for osteoporosis and fractures, ask your health care provider if you should: Be screened for bone loss. Take a calcium or vitamin D supplement to lower your risk of fractures. Be given hormone replacement therapy (HRT) to treat symptoms of menopause. Follow these instructions at home: Alcohol use Do not drink alcohol if: Your health care provider tells you not to drink. You are pregnant, may be pregnant, or are planning to become pregnant. If you drink alcohol: Limit how much you have to: 0-1 drink a day. Know how much alcohol is in your drink. In the [place] one drink equals one 12 oz bottle of beer (355 mL), one 5 oz glass of wine (148 mL), or one 1 oz glass of hard liquor (44  mL). Lifestyle Do not use any products that contain nicotine or tobacco. These products include cigarettes, chewing tobacco, and vaping devices, such as e-cigarettes. If you need help quitting, ask your health care provider. Do not use street drugs. Do not share needles. Ask your health care provider for help if you need support or information about quitting drugs. General instructions Schedule regular health, dental, and eye exams. Stay current with your vaccines. Tell your health care provider if: You often feel depressed. You have ever been abused or do not feel safe at home. Summary Adopting a healthy lifestyle and getting preventive care are important in promoting health and wellness. Follow your health care provider's instructions about healthy diet, exercising, and getting tested or screened for diseases. Follow your health care provider's instructions on monitoring your cholesterol and blood pressure. This information is not intended to replace advice given to you by your health care provider. Make sure you discuss any questions you have with your health care provider. Document Revised: 07/23/2020 Document Reviewed: 07/23/2020 Elsevier Patient Education  2022 [place], MD [facility]

## 2021-03-20 NOTE — Telephone Encounter (Signed)
Has 1 left of clonazePAM (KLONOPIN) 0.5 MG tablet [[id number]]    Pharmacy  [id number] [place], [place] - [address]  [address] [place] [address]  Phone:  [phone]  Fax:  [phone]

## 2021-03-20 NOTE — Patient Instructions (Signed)

## 2021-03-21 ENCOUNTER — Telehealth: Payer: Self-pay | Admitting: Physician Assistant

## 2021-03-21 NOTE — Telephone Encounter (Signed)
Scheduled appt per 1/4 referral. Pt is aware of appt date and time. Pt is aware to arrive 15 mins prior to appt time.  °

## 2021-03-22 DIAGNOSIS — Z124 Encounter for screening for malignant neoplasm of cervix: Secondary | ICD-10-CM | POA: Diagnosis not present

## 2021-03-22 DIAGNOSIS — Z1151 Encounter for screening for human papillomavirus (HPV): Secondary | ICD-10-CM | POA: Diagnosis not present

## 2021-03-22 DIAGNOSIS — Z01419 Encounter for gynecological examination (general) (routine) without abnormal findings: Secondary | ICD-10-CM | POA: Diagnosis not present

## 2021-03-22 DIAGNOSIS — Z113 Encounter for screening for infections with a predominantly sexual mode of transmission: Secondary | ICD-10-CM | POA: Diagnosis not present

## 2021-03-22 DIAGNOSIS — Z202 Contact with and (suspected) exposure to infections with a predominantly sexual mode of transmission: Secondary | ICD-10-CM | POA: Diagnosis not present

## 2021-03-22 DIAGNOSIS — Z1231 Encounter for screening mammogram for malignant neoplasm of breast: Secondary | ICD-10-CM | POA: Diagnosis not present

## 2021-03-22 DIAGNOSIS — Z6832 Body mass index (BMI) 32.0-32.9, adult: Secondary | ICD-10-CM | POA: Diagnosis not present

## 2021-03-22 DIAGNOSIS — R3129 Other microscopic hematuria: Secondary | ICD-10-CM | POA: Diagnosis not present

## 2021-04-01 ENCOUNTER — Encounter: Payer: Self-pay | Admitting: Gastroenterology

## 2021-04-03 ENCOUNTER — Ambulatory Visit (AMBULATORY_SURGERY_CENTER): Payer: BC Managed Care – PPO | Admitting: *Deleted

## 2021-04-03 ENCOUNTER — Other Ambulatory Visit: Payer: Self-pay

## 2021-04-03 VITALS — Ht 64.0 in | Wt 192.0 lb

## 2021-04-03 DIAGNOSIS — Z1211 Encounter for screening for malignant neoplasm of colon: Secondary | ICD-10-CM

## 2021-04-03 NOTE — Progress Notes (Signed)

## 2021-04-05 ENCOUNTER — Encounter: Payer: Self-pay | Admitting: Physical Therapy

## 2021-04-05 ENCOUNTER — Other Ambulatory Visit: Payer: Self-pay

## 2021-04-05 ENCOUNTER — Ambulatory Visit: Payer: BC Managed Care – PPO | Attending: Obstetrics and Gynecology | Admitting: Physical Therapy

## 2021-04-05 DIAGNOSIS — N8189 Other female genital prolapse: Secondary | ICD-10-CM | POA: Insufficient documentation

## 2021-04-05 DIAGNOSIS — M6281 Muscle weakness (generalized): Secondary | ICD-10-CM | POA: Diagnosis not present

## 2021-04-05 NOTE — Patient Instructions (Signed)
About Cystocele Overview The pelvic organs, including the bladder, are normally supported by pelvic floor muscles and ligaments.   When these muscles and ligaments are stretched, weakened or torn, the wall between the bladder and the vagina sags or herniates causing a prolapse, sometimes called a cystocele. This condition may cause discomfort and problems with emptying the bladder.  It can be present in various stages.  Some people are not aware of the changes. Others may notice changes at the vaginal opening or a feeling of the bladder dropping outside the body. Causes of a Cystocele A cystocele is usually caused by muscle straining or stretching during childbirth.  In addition, cystocele is more common after menopause, because the hormone estrogen helps keep the elastic tissues around the pelvic organs strong.  A cystocele is more likely to occur when levels of estrogen decrease. Other causes include: heavy lifting, chronic coughing, previous pelvic surgery and obesity.  Symptoms A bladder that has dropped from its normal position may cause: unwanted urine leakage (stress incontinence), frequent urination or urge to urinate, incomplete emptying of the bladder (not feeling bladder relief after emptying), pain or discomfort in the vagina, pelvis, groin, lower back or lower abdomen and frequent urinary tract infections.  Mild cases may not cause any symptoms.  Treatment Options Pelvic floor (Kegel) exercises: Strength training the muscles in your genital area  Behavioral changes: Treating and preventing constipation, taking time to empty your bladder properly, learning to lift properly and/or  avoid heavy lifting when possible, stopping smoking, avoiding weight gain and treating a chronic cough or bronchitis. A pessary: A vaginal support device is sometimes used to help pelvic support caused by muscle and ligament changes. Surgery: Aurgical repair may be necessary if symptoms cannot be managed with  exercise, behavioral changes and a pessary. Surgery is usually considered for severe cases.  Ways to protect prolapse Quitting smoking. Treating conditions that might put strain on the pelvic floor, such as a chronic cough or constipation. Losing weight. Strengthening your core and your pelvic floor. Avoiding heavy lifting, pushing, pulling, and bending When lifting using proper body mechanics Not straining during bowel movements. High fiber diet, 30 g, to reduce chance of constipation Drink 6-8 glasses of water per day Avoid being on your feet for long periods of time Middle of the day lay on your back with your feet propped up for 5-10 minutes  About Pelvic Support Problems Pelvic Support Problems Explained Ligaments, muscles, and connective tissue normally hold your bladder, uterus, and other organs in their proper places in your pelvis. When these tissues become weak, a problem with pelvic support may result. Weak support can cause one or more of the pelvic organs to drop down into the vagina. An organ may even drop so far that is partially exposed outside the body.  Pelvic support problems are named by the change in the organ. The main types of pelvic support problems are:  Cystocele: When the bladder drops down into your vagina.  Enterocele: When your small intestine drops between your vagina and rectum.  Rectocele: When your rectum bulges into the vaginal wall.  Uterine prolapse: When your uterus drops into your vagina.  Vaginal prolapse: When the top part of the vagina begins to droop. This sometimes happens after a hysterectomy (removal of the uterus).  Causes Pelvic support problems can be caused by many conditions. They may begin after you give birth, especially if you had a large baby. During childbirth, the muscles and skin of the  birth canal (vagina) are stretched and sometimes torn. They heal over time but are not always exactly the same. A long pushing stage of labor may also  weaken these tissues as well as very rapid births as the tissues do not have time to stretch so they tear.  Also, after menopause, there are changes in the vaginal walls resulting from a decrease in estrogen. Estrogen helps to keep the tissues toned. Low levels of estrogen weaken the vaginal walls and may cause the bladder to shift from its normal position. As women get older, the loss of muscle tone and the relaxation of muscles may cause the uterus or other organs to drop.  Over time, conditions like chronic coughing, chronic constipation, doing a lot of heavy lifting, straining to pass stool, and obesity, can also weaken the pelvic support muscles.  Diagnosing Pelvic Support Problems Your health care provider will ask about your symptoms and do a pelvic examination. Your provider may also do a rectal exam during your pelvic exam. Your provider may ask you to: 1. Bear down and push (like you are having a bowel movement) so he or she can see if your bladder or other part of your body protrudes into the vagina. 2. Contract the muscles of your pelvis to check the strength of your pelvic muscles.  3. Do several types of urine, nerve and muscle tests of the pelvis and around the bladder to see what type of treatment is best for you.   Symptoms Symptoms of pelvic support problems depend on the organ involved, but may include:  urine leakage  stain or fecal loss after a bowel movement trouble having bowel movements  ache in the lower abdomen, groin, or lower back  bladder infection  a feeling of heaviness, pulling, or fullness in the pelvis, or a feeling that something is falling out of the vagina  an organ protruding from your vaginal opening  feeling the need to support the organs or perineal area to empty bladder or bowels painful sexual intercourse.  Many women feel pelvic pressure or trouble holding their urine immediately after childbirth. For some, these symptoms go away permanently, in others  they return as they get older.  Treatment Options A prolapsed organ cannot repair itself. Contact your health care provider as soon as you notice symptoms of a problem. Treatment depends on what the specific problem is and how far advanced it is.  The symptoms caused by some pelvic support problems may simply be treated with changes in diet, medicine to soften the stool, weight loss, or avoiding strenuous activities. You may also do pelvic floor exercises to help strengthen your pelvic muscles.  Some cases of prolapse may require a special support device made from plastic or rubber called a pessary that fits into the vagina to support the uterus, vagina, or bladder. A pessary can also help women who leak urine when coughing, straining, or exercising. In mild cases, a tampon or vaginal diaphragm may be used instead of a pessary.  Talk to your doctor or health care provider about these options. In serious cases, surgery may be needed to put the organs back into their proper place. The uterus may be removed because of the pressure it puts on the bladder.  Your doctor will know what surgery will be best for you. How can I prevent pelvic support problems?  You can help prevent pelvic support problems by:  maintaining a healthy lifestyle  continuing to do pelvic floor exercises after you  deliver a baby  maintaining a healthy weight  avoiding a lot of heavy lifting and lifting with your legs (not from your waist)  treating constipation and avoid getting   [facility] [address], [address] [place], [place] [address] Phone # [phone] Fax [phone]

## 2021-04-05 NOTE — Therapy (Signed)
[facility] @ [facility] [address] [place], [place], [address] Phone: [phone]   Fax:  [phone]  Physical Therapy Evaluation  Patient Details  Name: Cathy Cole MRN: [id number] Date of Birth: Sep 11, 1970 Referring Provider (PT): Dr. Sherron Monday Stuckert   Encounter Date: 04/05/2021   PT End of Session - 04/05/21 0822     Visit Number 1    Date for PT Re-Evaluation 06/28/21    Authorization Type BCBS    PT Start Time 0800    PT Stop Time 0835    PT Time Calculation (min) 35 min    Activity Tolerance Patient tolerated treatment well    Behavior During Therapy [facility] for tasks assessed/performed             Past Medical History:  Diagnosis Date   Abnormal uterine bleeding (AUB) 11/07/2014   Allergy    Anxiety    Hypertension    Menorrhagia 11/07/2014    Past Surgical History:  Procedure Laterality Date   BUNIONECTOMY     COSMETIC SURGERY     EAR CYST EXCISION Right 2021   tubes left ear   NOVASURE ABLATION N/A 11/08/2014   Procedure: NOVASURE ABLATION;  Surgeon: Sherian Rein, MD;  Location: [facility];  Service: Gynecology;  Laterality: N/A;   TUBAL LIGATION      There were no vitals filed for this visit.    Subjective Assessment - 04/05/21 0805     Subjective Patient reports she noticed prolapse 12/2020. Patient felt a bulge. She has not urinary leakage.    Patient Stated Goals learn how to strengthen her pelvic floor muscles    Currently in Pain? No/denies    Multiple Pain Sites No                [facility] PT Assessment - 04/05/21 0001       Assessment   Medical Diagnosis N81.89 Other female genital prolapse    Referring Provider (PT) Dr. Sherron Monday Stuckert    Onset Date/Surgical Date 12/15/20    Prior Therapy none      Precautions   Precautions None      Restrictions   Weight Bearing Restrictions No      Balance Screen   Has the patient fallen in the past 6 months No    Has the  patient had a decrease in activity level because of a fear of falling?  No    Is the patient reluctant to leave their home because of a fear of falling?  No      Home Tourist information centre manager residence      Prior Function   Level of Independence Independent    Vocation Full time employment    Vocation Requirements sitting at desk 90% of the day    Leisure none      Cognition   Overall Cognitive Status Within Functional Limits for tasks assessed      Posture/Postural Control   Posture/Postural Control No significant limitations      ROM / Strength   AROM / PROM / Strength AROM;PROM;Strength      AROM   Overall AROM Comments Lumbar ROM decreased by 25%      Strength   Right Hip Flexion 4/5    Right Hip ABduction 3+/5    Left Hip Flexion 4/5    Left Hip ABduction 3+/5      Special Tests    Special Tests Hip Special Tests  Hip Special Tests  Trendelenberg Test      Trendelenburg Test   Findings Positive    Side Right    Comments left hip drops                        Objective measurements completed on examination: See above findings.     Pelvic Floor Special Questions - 04/05/21 0001     Prior Pregnancies Yes    Number of Pregnancies 4   1 still born   Number of Vaginal Deliveries 3   tore on first child with stitches   Currently Sexually Active No    Urinary Leakage No    Fecal incontinence No    Falling out feeling (prolapse) Yes    Activities that cause feeling of prolapse stand long time    Skin Integrity Intact    Perineal Body/Introitus  Other    Prolapse Anterior Wall   standing comes to the introitus; supine bear down comes to the introitus   Pelvic Floor Internal Exam Patient confirms identification and approves PT to assess pelvic floor and treatment    Exam Type Vaginal    Palpation restrictions around the introitus especially anteriorl, tightness on the superor sides of the bladder    Strength weak squeeze, no lift    supine and standing, less anteriorly                      PT Education - 04/05/21 0932     Education Details education on what a prolpase is, showed her the prolapse in standing, educated patient with a video what happens with a prolapse, gave her information on belt to wear for prolapse and impressa to insert to reduce the prolapse    Person(s) Educated Patient    Methods Explanation;Handout    Comprehension Verbalized understanding              PT Short Term Goals - 04/05/21 0934       PT SHORT TERM GOAL #1   Title independent with initial supine pelvic floor and core exercises    Time 4    Status New    Target Date 05/03/21      PT SHORT TERM GOAL #2   Title education on prolapse management to reduce and prevent further progression    Time 4    Period Weeks    Status New    Target Date 05/03/21               PT Long Term Goals - 04/05/21 0937       PT LONG TERM GOAL #1   Title indeendent with HEP for pelvic floor and core in upright positions    Time 12    Period Weeks    Status New    Target Date 06/28/21      PT LONG TERM GOAL #2   Title pelvic floor strength >/= 3/5 in standing to hold the prolapse above the introitus and reduce progression    Time 12    Period Weeks    Status New    Target Date 06/28/21      PT LONG TERM GOAL #3   Title able to stand for 30-40 minutes without feeling a bulge due to improved support of the prolapse    Time 12    Period Weeks    Status New    Target Date 06/28/21      PT  LONG TERM GOAL #4   Title understand how to lift and perform daily tasks with correct mechanics and breath to reduce strain on prolapse    Time 12    Period Weeks    Status New    Target Date 06/28/21                    Plan - 04/05/21 0842     Clinical Impression Statement Patient is a 51 year old female with prolapse since 10/22. Patient reports no pain or urinary leakage. She feels a bulge with standing. Pelvic  floor strength is 2/5 and tighter anteriorly.In supine when she bears down her anterior wall prolapse goes tot he introitus and in standing the anterior wall prolapse is already at the introitus. She has tightness in the superior sides of the bladder. She has decreased mobility of the perineal body. Her vulvar tissue is a good pink coloring. Patient is able to contract her abdomen without a lower abdominal bulge. Bilateral hip flexion is 4/5 and abduction is 3+/5. She has a Trendelenberg on the right. Patient will benefit from skilled therapy to improve pelvic floor and core strength to assist with the management of the prolapse.    Personal Factors and Comorbidities Fitness    Examination-Activity Limitations Stand    Stability/Clinical Decision Making Stable/Uncomplicated    Clinical Decision Making Low    Rehab Potential Excellent    PT Frequency 1x / week    PT Duration 12 weeks    PT Treatment/Interventions ADLs/Self Care Home Management;Biofeedback;Therapeutic activities;Therapeutic exercise;Neuromuscular re-education;Patient/family education;Manual techniques    PT Next Visit Plan go over pressure management with daily tasks, work on pelvic floor strength in supine with hips lifted, go over items that can help manage prolapse    Consulted and Agree with Plan of Care Patient             Patient will benefit from skilled therapeutic intervention in order to improve the following deficits and impairments:  Decreased coordination, Increased fascial restricitons, Decreased strength  Visit Diagnosis: Muscle weakness (generalized) - Plan: PT plan of care cert/re-cert     Problem List Patient Active Problem List   Diagnosis Date Noted   Paresthesia 01/01/2021   Fasciculation 01/01/2021   Essential hypertension 08/10/2018   Generalized anxiety disorder 05/10/2018    Eulis Fosterheryl Coner Gibbard, PT 04/05/21 10:19 AM   [facility] @ [facility] [address] [place], [place], [address] Phone: [phone]   Fax:  [phone]  Name: Tilford PillarLatania Lorence MRN: [id number] Date of Birth: 1970-11-30

## 2021-04-12 ENCOUNTER — Ambulatory Visit: Payer: BC Managed Care – PPO | Admitting: Physical Therapy

## 2021-04-12 ENCOUNTER — Encounter: Payer: Self-pay | Admitting: Physician Assistant

## 2021-04-12 ENCOUNTER — Other Ambulatory Visit: Payer: Self-pay

## 2021-04-12 ENCOUNTER — Inpatient Hospital Stay: Payer: BC Managed Care – PPO

## 2021-04-12 ENCOUNTER — Inpatient Hospital Stay: Payer: BC Managed Care – PPO | Attending: Physician Assistant | Admitting: Physician Assistant

## 2021-04-12 VITALS — BP 142/76 | HR 80 | Temp 97.5°F | Resp 17 | Wt 194.4 lb

## 2021-04-12 DIAGNOSIS — Z79899 Other long term (current) drug therapy: Secondary | ICD-10-CM | POA: Diagnosis not present

## 2021-04-12 DIAGNOSIS — I10 Essential (primary) hypertension: Secondary | ICD-10-CM | POA: Diagnosis not present

## 2021-04-12 DIAGNOSIS — R233 Spontaneous ecchymoses: Secondary | ICD-10-CM | POA: Diagnosis not present

## 2021-04-12 DIAGNOSIS — F419 Anxiety disorder, unspecified: Secondary | ICD-10-CM | POA: Insufficient documentation

## 2021-04-12 LAB — CBC WITH DIFFERENTIAL (CANCER CENTER ONLY)
Abs Immature Granulocytes: 0.03 10*3/uL (ref 0.00–0.07)
Basophils Absolute: 0 10*3/uL (ref 0.0–0.1)
Basophils Relative: 0 %
Eosinophils Absolute: 0.3 10*3/uL (ref 0.0–0.5)
Eosinophils Relative: 4 %
HCT: 39.6 % (ref 36.0–46.0)
Hemoglobin: 13.1 g/dL (ref 12.0–15.0)
Immature Granulocytes: 0 %
Lymphocytes Relative: 42 %
Lymphs Abs: 3.5 10*3/uL (ref 0.7–4.0)
MCH: 29.4 pg (ref 26.0–34.0)
MCHC: 33.1 g/dL (ref 30.0–36.0)
MCV: 88.8 fL (ref 80.0–100.0)
Monocytes Absolute: 0.5 10*3/uL (ref 0.1–1.0)
Monocytes Relative: 6 %
Neutro Abs: 3.9 10*3/uL (ref 1.7–7.7)
Neutrophils Relative %: 48 %
Platelet Count: 322 10*3/uL (ref 150–400)
RBC: 4.46 MIL/uL (ref 3.87–5.11)
RDW: 13 % (ref 11.5–15.5)
WBC Count: 8.2 10*3/uL (ref 4.0–10.5)
nRBC: 0 % (ref 0.0–0.2)

## 2021-04-12 LAB — CMP (CANCER CENTER ONLY)
ALT: 11 U/L (ref 0–44)
AST: 13 U/L — ABNORMAL LOW (ref 15–41)
Albumin: 4.2 g/dL (ref 3.5–5.0)
Alkaline Phosphatase: 81 U/L (ref 38–126)
Anion gap: 8 (ref 5–15)
BUN: 12 mg/dL (ref 6–20)
CO2: 27 mmol/L (ref 22–32)
Calcium: 9.4 mg/dL (ref 8.9–10.3)
Chloride: 105 mmol/L (ref 98–111)
Creatinine: 0.76 mg/dL (ref 0.44–1.00)
GFR, Estimated: >60 mL/min (ref >60)
Glucose, Bld: 95 mg/dL (ref 70–99)
Potassium: 3.3 mmol/L — ABNORMAL LOW (ref 3.5–5.1)
Sodium: 140 mmol/L (ref 135–145)
Total Bilirubin: 0.5 mg/dL (ref 0.3–1.2)
Total Protein: 7.9 g/dL (ref 6.5–8.1)

## 2021-04-12 LAB — PROTIME-INR
INR: 0.9 (ref 0.8–1.2)
Prothrombin Time: 12.4 seconds (ref 11.4–15.2)

## 2021-04-12 LAB — APTT: aPTT: 29 seconds (ref 24–36)

## 2021-04-12 NOTE — Progress Notes (Signed)
[place] [phone]   [phone] [place] NOTE  Patient Care Team: Dorna Mai, MD as PCP - General (Family Medicine)  CHIEF COMPLAINTS/PURPOSE OF CONSULTATION:  Easy bruising  HISTORY OF PRESENTING ILLNESS:  Cathy Cole 51 y.o. female with medical history significant for anxiety and hypertension. She is unaccompanied for this visit.   Cathy Cole reports noticing bruising on her thighs around June 2022. She adds the bruises are nickel size and not associated with any injury. She reports some tenderness with the bruising when noticed. She denies bruising in other locations including upper extremities or torso. She denies any signs of active bleeding. She no longer has a monthly menstrual cycle as she going through menopause.   Otherwise, Cathy Cole is in good health without any fatigue. She eats a balance diet without any dietary restrictions. She denies any recent weight loss or appetite changes. denies fevers, chills, night sweats, shortness of breath, chest pain or cough. She has no other complaints. Rest of the 10 point ROS is below.   MEDICAL HISTORY:  Past Medical History:  Diagnosis Date   Abnormal uterine bleeding (AUB) 11/07/2014   Allergy    Anxiety    Hypertension    Menorrhagia 11/07/2014    SURGICAL HISTORY: Past Surgical History:  Procedure Laterality Date   BUNIONECTOMY     COSMETIC SURGERY     abdominoplasty   EAR CYST EXCISION Right 2021   tubes left ear   NOVASURE ABLATION N/A 11/08/2014   Procedure: NOVASURE ABLATION;  Surgeon: Janyth Contes, MD;  Location: [facility];  Service: Gynecology;  Laterality: N/A;   TUBAL LIGATION      SOCIAL HISTORY: Social History   Socioeconomic History   Marital status: Divorced    Spouse name: Not on file   Number of children: 3   Years of education: Not on file   Highest education level: Not on file  Occupational History   Not on file  Tobacco Use   Smoking  status: Never   Smokeless tobacco: Never  Vaping Use   Vaping Use: Never used  Substance and Sexual Activity   Alcohol use: No   Drug use: Never   Sexual activity: Yes  Other Topics Concern   Not on file  Social History Narrative   Not on file   Social Determinants of Health   Financial Resource Strain: Not on file  Food Insecurity: Not on file  Transportation Needs: Not on file  Physical Activity: Not on file  Stress: Not on file  Social Connections: Not on file  Intimate Partner Violence: Not on file    FAMILY HISTORY: Family History  Problem Relation Age of Onset   Hypertension Mother    Diabetes Maternal Grandmother    Hypertension Maternal Grandmother    Diabetes Other    Hypertension Other    Colon cancer Neg Hx    Colon polyps Neg Hx    Esophageal cancer Neg Hx    Rectal cancer Neg Hx    Stomach cancer Neg Hx     ALLERGIES:  is allergic to bactrim [sulfamethoxazole-trimethoprim].  MEDICATIONS:  Current Outpatient Medications  Medication Sig Dispense Refill   acetaminophen (TYLENOL) 500 MG tablet Take 1,000 mg by mouth every 6 (six) hours as needed for mild pain, moderate pain or headache.     clonazePAM (KLONOPIN) 0.5 MG tablet TAKE 1 TABLET BY MOUTH 2 TIMES DAILY AS NEEDED FOR ANXIETY. 30 tablet 0   metoprolol succinate (TOPROL-XL)  25 MG 24 hr tablet TAKE 1 TABLET (25 MG TOTAL) BY MOUTH DAILY. 90 tablet 0   valsartan-hydrochlorothiazide (DIOVAN HCT) 80-12.5 MG tablet Take 1 tablet by mouth daily. 90 tablet 3   No current facility-administered medications for this visit.    REVIEW OF SYSTEMS:   Constitutional: ( - ) fevers, ( - )  chills , ( - ) night sweats Eyes: ( - ) blurriness of vision, ( - ) double vision, ( - ) watery eyes Ears, nose, mouth, throat, and face: ( - ) mucositis, ( - ) sore throat Respiratory: ( - ) cough, ( - ) dyspnea, ( - ) wheezes Cardiovascular: ( - ) palpitation, ( - ) chest discomfort, ( - ) lower extremity  swelling Gastrointestinal:  ( - ) nausea, ( - ) heartburn, ( - ) change in bowel habits Skin: ( - ) abnormal skin rashes Lymphatics: ( - ) new lymphadenopathy, ( + ) easy bruising Neurological: ( - ) numbness, ( - ) tingling, ( - ) new weaknesses Behavioral/Psych: ( - ) mood change, ( - ) new changes  All other systems were reviewed with the patient and are negative.  PHYSICAL EXAMINATION: ECOG PERFORMANCE STATUS: 0 - Asymptomatic  Vitals:   04/12/21 1358  BP: (!) 142/76  Pulse: 80  Resp: 17  Temp: (!) 97.5 F (36.4 C)  SpO2: 100%   Filed Weights   04/12/21 1358  Weight: 194 lb 6.4 oz (88.2 kg)    GENERAL: well appearing female in NAD  SKIN: skin color, texture, turgor are normal, no rashes or significant lesions EYES: conjunctiva are pink and non-injected, sclera clear OROPHARYNX: no exudate, no erythema; lips, buccal mucosa, and tongue normal  NECK: supple, non-tender LYMPH:  no palpable lymphadenopathy in the cervical or supraclavicular lymph nodes.  LUNGS: clear to auscultation and percussion with normal breathing effort HEART: regular rate & rhythm and no murmurs and no lower extremity edema ABDOMEN: soft, non-tender, non-distended, normal bowel sounds Musculoskeletal: no cyanosis of digits and no clubbing  PSYCH: alert & oriented x 3, fluent speech NEURO: no focal motor/sensory deficits  LABORATORY DATA:  I have reviewed the data as listed CBC Latest Ref Rng & Units 01/01/2021 02/20/2020 07/26/2019  WBC 3.4 - 10.8 x10E3/uL 8.9 11.0(H) 12.1(H)  Hemoglobin 11.1 - 15.9 g/dL 13.6 14.0 14.2  Hematocrit 34.0 - 46.6 % 40.9 41.5 42.3  Platelets 150 - 450 x10E3/uL 315 376 343    CMP Latest Ref Rng & Units 01/01/2021 02/20/2020 07/26/2019  Glucose 70 - 99 mg/dL 81 89 98  BUN 6 - 24 mg/dL 13 17 13   Creatinine 0.57 - 1.00 mg/dL 0.74 0.94 0.85  Sodium 134 - 144 mmol/L 141 139 141  Potassium 3.5 - 5.2 mmol/L 4.9 3.7 4.0  Chloride 96 - 106 mmol/L 101 100 102  CO2 20 - 29  mmol/L 19(L) 24 25  Calcium 8.7 - 10.2 mg/dL 9.5 9.7 9.9  Total Protein 6.0 - 8.5 g/dL 7.7 7.6 7.7  Total Bilirubin 0.0 - 1.2 mg/dL 0.5 0.4 0.6  Alkaline Phos 44 - 121 IU/L 102 94 91  AST 0 - 40 IU/L 25 12 15   ALT 0 - 32 IU/L 14 14 19    ASSESSMENT & PLAN Cathy Cole is a 51 y.o. female who presents for initial evaluation for easy bruising. We discussed underlying causes for easy bruising including vitamin deficiencies and bleeding disorders. It is reassuring patient's bruising is in low risk areas and there is no active bleeding.  Patient will undergo serologic work to check CBC, CMP, PT/INR and PTT levels. We will review results by phone to determine if additional diagnostic testing is needed. If above workup is negative, recommend to monitor bruising closely and follow up in clinic if she develops bruising in other locations such as torso or she develops active bleeding.   #Easy bruising: --Labs today to check CBC, CMP, PT/INR and PTT --RTC  based on above workup.    Orders Placed This Encounter  Procedures   CBC with Differential ([place] Only)    Standing Status:   Future    Number of Occurrences:   1    Standing Expiration Date:   04/12/2022   CMP ([place] only)    Standing Status:   Future    Number of Occurrences:   1    Standing Expiration Date:   04/12/2022   Protime-INR    Standing Status:   Future    Number of Occurrences:   1    Standing Expiration Date:   04/12/2022   APTT    Standing Status:   Future    Number of Occurrences:   1    Standing Expiration Date:   04/12/2022    All questions were answered. The patient knows to call the clinic with any problems, questions or concerns.  I have spent a total of 45 minutes minutes of face-to-face and non-face-to-face time, preparing to see the patient, obtaining and/or reviewing separately obtained history, performing a medically appropriate examination, counseling and educating the patient, ordering tests,  documenting clinical information in the electronic health record, and care coordination.   Dede Query, PA-C Department of Hematology/Oncology [place] at [facility] Phone: [phone]  Patient was seen with Dr. Lorenso Courier.   I have read the above note and personally examined the patient. I agree with the assessment and plan as noted above.  Briefly Cathy Cole is a 51 year old female who presents for evaluation of easy bruising.  She developed small bruises on her lower extremities without clear explanation.  She has never had particular large bruises.  She notes that she was prone to heavy menstrual cycles but does not have any other sites of bleeding such as nosebleeds, gum bleeding, or dark stools.  At this time we will do a broad work-up to include PT, INR, and PTT to determine if she has any apparent disorders of coagulation.  If the studies are normal would recommend routine follow-up with her primary care provider.   Ledell Peoples, MD Department of Hematology/Oncology [place] at [facility] Phone: [phone] Pager: [phone] Email: [email or web address]

## 2021-04-15 ENCOUNTER — Telehealth: Payer: Self-pay

## 2021-04-15 NOTE — Telephone Encounter (Signed)
-----   Message from Briant Cedar, PA-C sent at 04/12/2021  4:19 PM EST ----- Please notify patient that labs don't suggest any bleeding disorders. She mild hypokalemia so recommend to eat potassium rich foods. No need for follow up unless she develops more bruising or signs of bleeding.

## 2021-04-15 NOTE — Telephone Encounter (Signed)
Pt advised with VU. List of high potassium foods given to pt.

## 2021-04-19 ENCOUNTER — Other Ambulatory Visit: Payer: Self-pay

## 2021-04-19 ENCOUNTER — Ambulatory Visit: Payer: BC Managed Care – PPO | Attending: Obstetrics and Gynecology | Admitting: Physical Therapy

## 2021-04-19 ENCOUNTER — Encounter: Payer: Self-pay | Admitting: Physical Therapy

## 2021-04-19 DIAGNOSIS — M6281 Muscle weakness (generalized): Secondary | ICD-10-CM | POA: Diagnosis not present

## 2021-04-19 NOTE — Patient Instructions (Addendum)
Ways to protect prolapse Quitting smoking. Treating conditions that might put strain on the pelvic floor, such as a chronic cough or constipation. Losing weight. Strengthening your core and your pelvic floor. Avoiding heavy lifting, pushing, pulling, and bending When lifting using proper body mechanics Not straining during bowel movements. High fiber diet, 30 g, to reduce chance of constipation Drink 6-8 glasses of water per day Avoid being on your feet for long periods of time Middle of the day lay on your back with your feet propped up for 5-10 minutes  Prolapse supports EVB Support briefs [facility] Support Briefs Pelvic Pro Vaginal sponge  Access Code: [email or web address] URL: [email or web address] Date: 04/19/2021 Prepared by: Earlie Counts  Exercises Diaphragmatic Breathing at 90/90 Supported - 1 x daily - 7 x weekly - 1 sets - 10 reps Hooklying Transversus Abdominis Palpation - 1 x daily - 7 x weekly - 1 sets - 10 reps Hooklying Isometric Hip Flexion - 1 x daily - 7 x weekly - 2 sets - 5 reps - 3 sec hold Bent Knee Fallouts - 1 x daily - 7 x weekly - 1 sets - 10 reps  [facility] [address] [place], [place] [address] Phone # [phone] Fax [phone]

## 2021-04-19 NOTE — Therapy (Signed)
[facility] [address] [place], [place], [address] Phone: [phone]   Fax:  [phone]  Physical Therapy Treatment  Patient Details  Name: Cathy Cole MRN: [id number] Date of Birth: 1970/07/01 Referring Provider (PT): Dr. Sherron Monday Stuckert   Encounter Date: 04/19/2021   PT End of Session - 04/19/21 0958     Visit Number 2    Date for PT Re-Evaluation 06/28/21    Authorization Type BC[employer]    PT Start Time 0930    PT Stop Time 1008    PT Time Calculation (min) 38 min    Activity Tolerance Patient tolerated treatment well    Behavior During Therapy [facility] for tasks assessed/performed             Past Medical History:  Diagnosis Date   Abnormal uterine bleeding (AUB) 11/07/2014   Allergy    Anxiety    Hypertension    Menorrhagia 11/07/2014    Past Surgical History:  Procedure Laterality Date   BUNIONECTOMY     COSMETIC SURGERY     abdominoplasty   EAR CYST EXCISION Right 2021   tubes left ear   NOVASURE ABLATION N/A 11/08/2014   Procedure: NOVASURE ABLATION;  Surgeon: Sherian Rein, MD;  Location: [facility];  Service: Gynecology;  Laterality: N/A;   TUBAL LIGATION      There were no vitals filed for this visit.   Subjective Assessment - 04/19/21 0936     Subjective Patient reports no urinary leakage. Still feel the bulge.    Patient Stated Goals learn how to strengthen her pelvic floor muscles    Currently in Pain? No/denies                               [facility] Adult PT Treatment/Exercise - 04/19/21 0001       Self-Care   Self-Care Other Self-Care Comments    Other Self-Care Comments  I got the support for prolapse to wear . Educated patient on times to wear her suppport brief when she is standing alot at work or home; discussed pressure management with breathing out on lifting heavy times, keepsin stool regular, not holding breath with activity      Lumbar Exercises:  Supine   Ab Set 10 reps;5 seconds    AB Set Limitations wiht legs elevated, squeeze ball and contract the pelvic floor and abdomen    Clam 10 reps    Clam Limitations with pelvic floor contraction and abdominal    Isometric Hip Flexion 5 reps;3 seconds   left, right   Isometric Hip Flexion Limitations push ball into knee    Other Supine Lumbar Exercises diaphragmatic breathing in supine with hips on pillow and legs at 90/90 to relax the pelvic floor                     PT Education - 04/19/21 1006     Education Details Access Code: [email or web address]; prolapse management    Methods Explanation;Demonstration;Verbal cues;Handout    Comprehension Returned demonstration;Verbalized understanding              PT Short Term Goals - 04/05/21 0934       PT SHORT TERM GOAL #1   Title independent with initial supine pelvic floor and core exercises    Time 4    Status New    Target Date 05/03/21      PT  SHORT TERM GOAL #2   Title education on prolapse management to reduce and prevent further progression    Time 4    Period Weeks    Status New    Target Date 05/03/21               PT Long Term Goals - 04/05/21 0937       PT LONG TERM GOAL #1   Title indeendent with HEP for pelvic floor and core in upright positions    Time 12    Period Weeks    Status New    Target Date 06/28/21      PT LONG TERM GOAL #2   Title pelvic floor strength >/= 3/5 in standing to hold the prolapse above the introitus and reduce progression    Time 12    Period Weeks    Status New    Target Date 06/28/21      PT LONG TERM GOAL #3   Title able to stand for 30-40 minutes without feeling a bulge due to improved support of the prolapse    Time 12    Period Weeks    Status New    Target Date 06/28/21      PT LONG TERM GOAL #4   Title understand how to lift and perform daily tasks with correct mechanics and breath to reduce strain on prolapse    Time 12    Period Weeks    Status New     Target Date 06/28/21                   Plan - 04/19/21 0941     Clinical Impression Statement Patient has a prolapse belt and understands when to wear it to manage the prolapse. She is learning about pressure management. She is learning core and pelvic floor exercise to reduce prolapse.Patient is not leaking urine. She is able to contract her lower abdominals correctly and diaphragmatic breathing. Patient will benefit from therapy to improve pelvic floor strength and understand how to manage prolapse.    Personal Factors and Comorbidities Fitness    Examination-Activity Limitations Stand    Stability/Clinical Decision Making Stable/Uncomplicated    Rehab Potential Excellent    PT Frequency 1x / week    PT Duration 12 weeks    PT Treatment/Interventions ADLs/Self Care Home Management;Biofeedback;Therapeutic activities;Therapeutic exercise;Neuromuscular re-education;Patient/family education;Manual techniques    PT Next Visit Plan go over pressure management with daily tasks,manual work to the pelvic floor, alon gthe bladder, work the hip abductors    Recommended Other Services MD signed intial eval    Consulted and Agree with Plan of Care Patient             Patient will benefit from skilled therapeutic intervention in order to improve the following deficits and impairments:  Decreased coordination, Increased fascial restricitons, Decreased strength  Visit Diagnosis: Muscle weakness (generalized)     Problem List Patient Active Problem List   Diagnosis Date Noted   Easy bruising 04/12/2021   Paresthesia 01/01/2021   Fasciculation 01/01/2021   Essential hypertension 08/10/2018   Generalized anxiety disorder 05/10/2018    Eulis Foster, PT 04/19/21 10:11 AM  [facility] [address] [place], [place], [address] Phone: [phone]   Fax:  [phone]  Name: Monti Villers MRN: [id number] Date of Birth:  08/07/1970

## 2021-04-25 ENCOUNTER — Encounter: Payer: Self-pay | Admitting: Gastroenterology

## 2021-04-26 ENCOUNTER — Ambulatory Visit (AMBULATORY_SURGERY_CENTER): Payer: BC Managed Care – PPO | Admitting: Gastroenterology

## 2021-04-26 ENCOUNTER — Encounter: Payer: Self-pay | Admitting: Gastroenterology

## 2021-04-26 VITALS — BP 114/69 | HR 69 | Temp 98.6°F | Resp 11 | Ht 64.0 in | Wt 192.0 lb

## 2021-04-26 DIAGNOSIS — Z1211 Encounter for screening for malignant neoplasm of colon: Secondary | ICD-10-CM | POA: Diagnosis not present

## 2021-04-26 MED ORDER — SODIUM CHLORIDE 0.9 % IV SOLN: 500.0000 mL | Freq: Once | INTRAVENOUS | Status: DC

## 2021-04-26 NOTE — Op Note (Signed)
[facility] Patient Name: Cathy Cole Procedure Date: 04/26/2021 3:01 PM MRN: [id number] Endoscopist: Tressia Danas MD, MD Age: 51 Referring MD:  Date of Birth: 1971-03-09 Gender: Female Account #: [id number] Procedure:                Colonoscopy Indications:              Screening for colorectal malignant neoplasm, This                            is the patient's first colonoscopy Medicines:                Monitored Anesthesia Care Procedure:                Pre-Anesthesia Assessment:                           - Prior to the procedure, a History and Physical                            was performed, and patient medications and                            allergies were reviewed. The patient's tolerance of                            previous anesthesia was also reviewed. The risks                            and benefits of the procedure and the sedation                            options and risks were discussed with the patient.                            All questions were answered, and informed consent                            was obtained. Prior Anticoagulants: The patient has                            taken no previous anticoagulant or antiplatelet                            agents. ASA Grade Assessment: II - A patient with                            mild systemic disease. After reviewing the risks                            and benefits, the patient was deemed in                            satisfactory condition to undergo the procedure.  After obtaining informed consent, the colonoscope                            was passed under direct vision. Throughout the                            procedure, the patient's blood pressure, pulse, and                            oxygen saturations were monitored continuously. The                            Colonoscope was introduced through the anus and                            advanced to the 3 cm  into the ileum. The                            colonoscopy was performed without difficulty. The                            patient tolerated the procedure well. The quality                            of the bowel preparation was good. The terminal                            ileum, ileocecal valve, appendiceal orifice, and                            rectum were photographed. Scope In: 3:06:48 PM Scope Out: 3:21:14 PM Scope Withdrawal Time: 0 hours 12 minutes 21 seconds  Total Procedure Duration: 0 hours 14 minutes 26 seconds  Findings:                 The perianal and digital rectal examinations were                            normal.                           The entire examined colon appeared normal on direct                            and retroflexion views.                           The terminal ileum appeared normal.                           The exam was otherwise without abnormality on                            direct and retroflexion views. Complications:            No immediate complications. Estimated Blood Loss:  Estimated blood loss: none. Impression:               - The entire examined colon is normal on direct and                            retroflexion views.                           - The examined portion of the ileum was normal.                           - The examination was otherwise normal on direct                            and retroflexion views.                           - No specimens collected. Recommendation:           - Patient has a contact number available for                            emergencies. The signs and symptoms of potential                            delayed complications were discussed with the                            patient. Return to normal activities tomorrow.                            Written discharge instructions were provided to the                            patient.                           - Resume previous diet.                            - Continue present medications.                           - Repeat colonoscopy in 10 years for surveillance,                            earlier with new symptoms.                           - Emerging evidence supports eating a diet of                            fruits, vegetables, grains, calcium, and yogurt                            while reducing red meat and alcohol may reduce the  risk of colon cancer.                           - Thank you for allowing me to be involved in your                            colon cancer prevention. Tressia Danas MD, MD 04/26/2021 3:36:32 PM This report has been signed electronically.

## 2021-04-26 NOTE — Progress Notes (Signed)
° °  Referring Provider: Dorna Mai, MD Primary Care Physician:  Dorna Mai, MD  Reason for Procedure:  Colon cancer screening   IMPRESSION:  Need for colon cancer screening Appropriate candidate for monitored anesthesia care  PLAN: Colonoscopy in the [place] today   HPI: Cathy Cole is a 51 y.o. female presents for screening colonoscopy.  No prior colonoscopy or colon cancer screening.  No baseline GI symptoms.   No known family history of colon cancer or polyps. No family history of uterine/endometrial cancer, pancreatic cancer or gastric/stomach cancer.   Past Medical History:  Diagnosis Date   Abnormal uterine bleeding (AUB) 11/07/2014   Allergy    Anxiety    Hypertension    Menorrhagia 11/07/2014    Past Surgical History:  Procedure Laterality Date   BUNIONECTOMY     COSMETIC SURGERY     abdominoplasty   EAR CYST EXCISION Right 2021   tubes left ear   NOVASURE ABLATION N/A 11/08/2014   Procedure: NOVASURE ABLATION;  Surgeon: Janyth Contes, MD;  Location: [facility];  Service: Gynecology;  Laterality: N/A;   TUBAL LIGATION      Current Outpatient Medications  Medication Sig Dispense Refill   clonazePAM (KLONOPIN) 0.5 MG tablet TAKE 1 TABLET BY MOUTH 2 TIMES DAILY AS NEEDED FOR ANXIETY. 30 tablet 0   metoprolol succinate (TOPROL-XL) 25 MG 24 hr tablet TAKE 1 TABLET (25 MG TOTAL) BY MOUTH DAILY. 90 tablet 0   valsartan-hydrochlorothiazide (DIOVAN HCT) 80-12.5 MG tablet Take 1 tablet by mouth daily. 90 tablet 3   acetaminophen (TYLENOL) 500 MG tablet Take 1,000 mg by mouth every 6 (six) hours as needed for mild pain, moderate pain or headache.     Current Facility-Administered Medications  Medication Dose Route Frequency Provider Last Rate Last Admin   0.9 %  sodium chloride infusion  500 mL Intravenous Once Thornton Park, MD        Allergies as of 04/26/2021 - Review Complete 04/26/2021  Allergen Reaction Noted   Bactrim  [sulfamethoxazole-trimethoprim] Anaphylaxis 03/21/2014    Family History  Problem Relation Age of Onset   Hypertension Mother    Diabetes Maternal Grandmother    Hypertension Maternal Grandmother    Diabetes Other    Hypertension Other    Colon cancer Neg Hx    Colon polyps Neg Hx    Esophageal cancer Neg Hx    Rectal cancer Neg Hx    Stomach cancer Neg Hx      Physical Exam: General:   Alert,  well-nourished, pleasant and cooperative in NAD Head:  Normocephalic and atraumatic. Eyes:  Sclera clear, no icterus.   Conjunctiva pink. Mouth:  No deformity or lesions.   Neck:  Supple; no masses or thyromegaly. Lungs:  Clear throughout to auscultation.   No wheezes. Heart:  Regular rate and rhythm; no murmurs. Abdomen:  Soft, non-tender, nondistended, normal bowel sounds, no rebound or guarding.  Msk:  Symmetrical. No boney deformities LAD: No inguinal or umbilical LAD Extremities:  No clubbing or edema. Neurologic:  Alert and  oriented x4;  grossly nonfocal Skin:  No obvious rash or bruise. Psych:  Alert and cooperative. Normal mood and affect.     Studies/Results: No results found.    Lenise Jr L. Tarri Glenn, MD, MPH 04/26/2021, 2:57 PM

## 2021-04-26 NOTE — Patient Instructions (Addendum)
    Handout on diverticulosis given to you today.      YOU HAD AN ENDOSCOPIC PROCEDURE TODAY AT THE [facility]:   Refer to the procedure report that was given to you for any specific questions about what was found during the examination.  If the procedure report does not answer your questions, please call your gastroenterologist to clarify.  If you requested that your care partner not be given the details of your procedure findings, then the procedure report has been included in a sealed envelope for you to review at your convenience later.  YOU SHOULD EXPECT: Some feelings of bloating in the abdomen. Passage of more gas than usual.  Walking can help get rid of the air that was put into your GI tract during the procedure and reduce the bloating. If you had a lower endoscopy (such as a colonoscopy or flexible sigmoidoscopy) you may notice spotting of blood in your stool or on the toilet paper. If you underwent a bowel prep for your procedure, you may not have a normal bowel movement for a few days.  Please Note:  You might notice some irritation and congestion in your nose or some drainage.  This is from the oxygen used during your procedure.  There is no need for concern and it should clear up in a day or so.  SYMPTOMS TO REPORT IMMEDIATELY:  Following lower endoscopy (colonoscopy or flexible sigmoidoscopy):  Excessive amounts of blood in the stool  Significant tenderness or worsening of abdominal pains  Swelling of the abdomen that is new, acute  Fever of 100F or higher   For urgent or emergent issues, a gastroenterologist can be reached at any hour by calling [phone]. Do not use MyChart messaging for urgent concerns.    DIET:  We do recommend a small meal at first, but then you may proceed to your regular diet.  Drink plenty of fluids but you should avoid alcoholic beverages for 24 hours.  ACTIVITY:  You should plan to take it easy for the rest of today and you  should NOT DRIVE or use heavy machinery until tomorrow (because of the sedation medicines used during the test).    FOLLOW UP: Our staff will call the number listed on your records 48-72 hours following your procedure to check on you and address any questions or concerns that you may have regarding the information given to you following your procedure. If we do not reach you, we will leave a message.  We will attempt to reach you two times.  During this call, we will ask if you have developed any symptoms of COVID 19. If you develop any symptoms (ie: fever, flu-like symptoms, shortness of breath, cough etc.) before then, please call [phone].  If you test positive for Covid 19 in the 2 weeks post procedure, please call and report this information to us.    If any biopsies were taken you will be contacted by phone or by letter within the next 1-3 weeks.  Please call us at [phone] if you have not heard about the biopsies in 3 weeks.    SIGNATURES/CONFIDENTIALITY: You and/or your care partner have signed paperwork which will be entered into your electronic medical record.  These signatures attest to the fact that that the information above on your After Visit Summary has been reviewed and is understood.  Full responsibility of the confidentiality of this discharge information lies with you and/or your care-partner.  

## 2021-04-26 NOTE — Progress Notes (Signed)
To Pacu, VSS. Report to [email or web address] 

## 2021-04-26 NOTE — Progress Notes (Signed)
Vitals-DT  Pt's states no medical or surgical changes since previsit or office visit.  

## 2021-04-30 ENCOUNTER — Telehealth: Payer: Self-pay

## 2021-04-30 NOTE — Telephone Encounter (Signed)
°  Follow up Call-  Call back number 04/26/2021  Post procedure Call Back phone  # [phone]  Permission to leave phone message Yes  Some recent data might be hidden     Patient questions:  Do you have a fever, pain , or abdominal swelling? No. Pain Score  0 *  Have you tolerated food without any problems? Yes.    Have you been able to return to your normal activities? Yes.    Do you have any questions about your discharge instructions: Diet   No. Medications  No. Follow up visit  No.  Do you have questions or concerns about your Care? No.  Actions: * If pain score is 4 or above: No action needed, pain <4.  Have you developed a fever since your procedure? no  2.   Have you had an respiratory symptoms (SOB or cough) since your procedure? no  3.   Have you tested positive for COVID 19 since your procedure no  4.   Have you had any family members/close contacts diagnosed with the COVID 19 since your procedure?  no   If yes to any of these questions please route to Laverna Peace, RN and Karlton Lemon, RN

## 2021-05-03 ENCOUNTER — Encounter: Payer: Self-pay | Admitting: Physical Therapy

## 2021-05-03 ENCOUNTER — Other Ambulatory Visit: Payer: Self-pay

## 2021-05-03 ENCOUNTER — Ambulatory Visit: Payer: BC Managed Care – PPO | Admitting: Physical Therapy

## 2021-05-03 DIAGNOSIS — M6281 Muscle weakness (generalized): Secondary | ICD-10-CM

## 2021-05-03 NOTE — Patient Instructions (Signed)
Access Code: [email or web address] URL: [email or web address] Date: 05/03/2021 Prepared by: Eulis Foster  Exercises Diaphragmatic Breathing at 90/90 Supported - 1 x daily - 7 x weekly - 1 sets - 10 reps Hooklying Transversus Abdominis Palpation - 1 x daily - 7 x weekly - 1 sets - 10 reps Bent Knee Fallouts - 1 x daily - 3 x weekly - 1 sets - 10 reps Supine March with Resistance Band - 1 x daily - 3 x weekly - 2 sets - 10 reps Sidelying Hip Abduction with Resistance at Thighs - 1 x daily - 3 x weekly - 2 sets - 10 reps Seated Pelvic Floor Contraction - 3 x daily - 7 x weekly - 1 sets - 5 reps - 5-10 sec hold Quick Flick Pelvic Floor Contractions Seated - 3 x daily - 7 x weekly - 1 sets - 5 reps [facility] [address], [address] [place], [place] [address] Phone # [phone] Fax [phone]

## 2021-05-03 NOTE — Therapy (Addendum)
[name] [place], [place], [address] Phone: [phone]   Fax:  [phone]  Physical Therapy Treatment  Patient Details  Name: Cathy Cole MRN: [id number] Date of Birth: 03/06/1971 Referring Provider (PT): Dr. Thornell Sartorius Stuckert   Encounter Date: 05/03/2021   PT End of Session - 05/03/21 0852     Visit Number 3    Date for PT Re-Evaluation 06/28/21    Authorization Type BCBS    PT Start Time 0845    PT Stop Time 0923    PT Time Calculation (min) 38 min    Activity Tolerance Patient tolerated treatment well    Behavior During Therapy [facility] for tasks assessed/performed             Past Medical History:  Diagnosis Date   Abnormal uterine bleeding (AUB) 11/07/2014   Allergy    Anxiety    Hypertension    Menorrhagia 11/07/2014    Past Surgical History:  Procedure Laterality Date   BUNIONECTOMY     COSMETIC SURGERY     abdominoplasty   EAR CYST EXCISION Right 2021   tubes left ear   NOVASURE ABLATION N/A 11/08/2014   Procedure: NOVASURE ABLATION;  Surgeon: Janyth Contes, MD;  Location: [facility];  Service: Gynecology;  Laterality: N/A;   TUBAL LIGATION      There were no vitals filed for this visit.   Subjective Assessment - 05/03/21 0852     Subjective I have been doing the exercises. No urinary leakage. I still feel the bulge and no change at this time.    Patient Stated Goals learn how to strengthen her pelvic floor muscles    Currently in Pain? No/denies    Multiple Pain Sites No                               [facility] Adult PT Treatment/Exercise - 05/03/21 0001       Therapeutic Activites    Therapeutic Activities Lifting;ADL's    ADL's doing laundry, brushing teeth, washing dishes without flexing at the waist.    Lifting squatting with distance between the breast bone and pubic bone      Lumbar Exercises: Standing   Other Standing Lumbar Exercises  squats keeping distacne between the pubic bone and breast bone      Lumbar Exercises: Seated   Other Seated Lumbar Exercises seated pelvic floor contraction holding for 5 sec 93A then 5 quick flicks      Lumbar Exercises: Supine   Ab Set 10 reps;5 seconds    Clam 10 reps   then 10x with red band   Clam Limitations with pelvic floor contraction and abdominal    Bent Knee Raise 10 reps;1 second   right , left   Bent Knee Raise Limitations red band around the knees, contract the pelvic floor and abdominals      Knee/Hip Exercises: Sidelying   Hip ABduction Strengthening;Right;Left;1 set;10 reps   right, left   Hip ABduction Limitations red band , abdominal contraction and pelvic floor contraction                     PT Education - 05/03/21 0921     Education Details Access Code: [email or web address]; prolapse management with lifting and daily tasks    Person(s) Educated Patient    Methods Explanation;Demonstration;Verbal cues;Handout    Comprehension Returned demonstration;Verbalized understanding  PT Short Term Goals - 05/03/21 0856       PT SHORT TERM GOAL #1   Title independent with initial supine pelvic floor and core exercises    Time 4    Status Achieved      PT SHORT TERM GOAL #2   Title education on prolapse management to reduce and prevent further progression    Time 4    Period Weeks    Status Achieved               PT Long Term Goals - 04/05/21 5830       PT LONG TERM GOAL #1   Title indeendent with HEP for pelvic floor and core in upright positions    Time 12    Period Weeks    Status New    Target Date 06/28/21      PT LONG TERM GOAL #2   Title pelvic floor strength >/= 3/5 in standing to hold the prolapse above the introitus and reduce progression    Time 12    Period Weeks    Status New    Target Date 06/28/21      PT LONG TERM GOAL #3   Title able to stand for 30-40 minutes without feeling a bulge due to improved support of  the prolapse    Time 12    Period Weeks    Status New    Target Date 06/28/21      PT LONG TERM GOAL #4   Title understand how to lift and perform daily tasks with correct mechanics and breath to reduce strain on prolapse    Time 12    Period Weeks    Status New    Target Date 06/28/21                   Plan - 05/03/21 0853     Clinical Impression Statement Patient continues to not have urinary leakage. She continues to heve the prolapse. Patient will lay down in the middle of the day to reduce the prolapse. Patient is able to perfrom diaphramatic breathing correctly. Patient is using the prolapse belt and it is helping. Patient will benefit from skilled therapy to improve pelvic floor strength and understand how to manage prolapse.    Personal Factors and Comorbidities Fitness    Examination-Activity Limitations Stand    Stability/Clinical Decision Making Stable/Uncomplicated    Rehab Potential Excellent    PT Frequency 1x / week    PT Duration 12 weeks    PT Treatment/Interventions ADLs/Self Care Home Management;Biofeedback;Therapeutic activities;Therapeutic exercise;Neuromuscular re-education;Patient/family education;Manual techniques    PT Next Visit Plan manual work to the pelvic floor, along the bladder,add quadruped lift leg, sidely circles, sitting exercises for prolapse    PT Home Exercise Plan Access Code: [email or web address]    Consulted and Agree with Plan of Care Patient             Patient will benefit from skilled therapeutic intervention in order to improve the following deficits and impairments:  Decreased coordination, Increased fascial restricitons, Decreased strength  Visit Diagnosis: Muscle weakness (generalized)     Problem List Patient Active Problem List   Diagnosis Date Noted   Easy bruising 04/12/2021   Paresthesia 01/01/2021   Fasciculation 01/01/2021   Essential hypertension 08/10/2018   Generalized anxiety disorder 05/10/2018    Earlie Counts, PT 05/03/21 9:24 AM  [facility] @ [place], [place],  [address] Phone: [phone]   Fax:  [phone]  Name: Cathy Cole MRN: [id number] Date of Birth: [birth year]  PHYSICAL THERAPY DISCHARGE SUMMARY  Visits from Start of Care: 3  Current functional level related to goals / functional outcomes: See above. Patient wants to be discharged at this time. She will get a new script in the future if she needs more therapy.    Remaining deficits: See above.    Education / Equipment: HEP   Patient agrees to discharge. Patient goals were not met. Patient is being discharged due to the patient's request. Thank you for the new referral. Earlie Counts, PT 05/22/21 8:36 AM

## 2021-05-10 ENCOUNTER — Encounter: Payer: BC Managed Care – PPO | Admitting: Physical Therapy

## 2021-05-17 ENCOUNTER — Ambulatory Visit: Payer: BC Managed Care – PPO | Admitting: Family Medicine

## 2021-05-17 ENCOUNTER — Encounter: Payer: BC Managed Care – PPO | Admitting: Physical Therapy

## 2021-05-24 ENCOUNTER — Encounter: Payer: BC Managed Care – PPO | Admitting: Physical Therapy

## 2021-05-26 ENCOUNTER — Other Ambulatory Visit: Payer: Self-pay | Admitting: Family Medicine

## 2021-05-26 DIAGNOSIS — I10 Essential (primary) hypertension: Secondary | ICD-10-CM

## 2021-05-27 ENCOUNTER — Other Ambulatory Visit: Payer: Self-pay | Admitting: *Deleted

## 2021-05-27 DIAGNOSIS — I10 Essential (primary) hypertension: Secondary | ICD-10-CM

## 2021-05-27 MED ORDER — METOPROLOL SUCCINATE ER 25 MG PO TB24: 25.0000 mg | ORAL_TABLET | Freq: Every day | ORAL | 0 refills | Status: DC

## 2021-05-31 ENCOUNTER — Encounter: Payer: BC Managed Care – PPO | Admitting: Physical Therapy

## 2021-06-28 ENCOUNTER — Other Ambulatory Visit: Payer: Self-pay | Admitting: Family Medicine

## 2021-06-28 NOTE — Telephone Encounter (Signed)
Requested medication (s) are due for refill today: yes ? ?Requested medication (s) are on the active medication list: yes ? ?Last refill:  03/21/21 ? ?Future visit scheduled: yes but with a different provider ? ?Notes to clinic:  med not delegated to NT to RF ? ? ?Requested Prescriptions  ?Pending Prescriptions Disp Refills  ? clonazePAM (KLONOPIN) 0.5 MG tablet 30 tablet 0  ?  Sig: Take 1 tablet (0.5 mg total) by mouth 2 (two) times daily as needed for anxiety.  ?  ? Not Delegated - Psychiatry: Anxiolytics/Hypnotics 2 Failed - 06/28/2021  1:51 PM  ?  ?  Failed - This refill cannot be delegated  ?  ?  Failed - Urine Drug Screen completed in last 360 days  ?  ?  Passed - Patient is not pregnant  ?  ?  Passed - Valid encounter within last 6 months  ?  Recent Outpatient Visits   ? ?      ? 4 months ago Left knee pain, unspecified chronicity  ? Primary Care at [facility], MD  ? 7 months ago Essential hypertension  ? Primary Care at [facility], MD  ? 9 months ago Muscle spasm  ? Primary Care at [facility], Amy J, NP  ? 11 months ago Encounter to establish care  ? Primary Care at [facility], Bayard Beaver, MD  ? 1 year ago COVID-19 virus infection  ? Primary Care at [facility], Ines Bloomer, MD  ? ?  ?  ?Future Appointments   ? ?        ? In 9 months [place], Ines Bloomer, MD [employer] at [facility]  ? ?  ? ?  ?  ?  ? ? ? ? ?

## 2021-06-28 NOTE — Telephone Encounter (Signed)
Medication Refill - Medication: clonazePAM (KLONOPIN) 0.5 MG tablet [[id number]]  ? ?Has the patient contacted their pharmacy? Yes.   ? ? ?Preferred Pharmacy (with phone number or street name):  ?[id number] - [place], [place] - [address]  [address] [place] [address]  ?Phone: [phone] Fax: [phone]  ?Hours: Not open 24 hours  ? ?Has the patient been seen for an appointment in the last year OR does the patient have an upcoming appointment? Yes.  02/15/21 ? ?Agent: Please be advised that RX refills may take up to 3 business days. We ask that you follow-up with your pharmacy. ? ?

## 2021-07-02 NOTE — Telephone Encounter (Signed)
Patient called in to inquire of Dr Andrey Campanile why she have not filled her Rx for her medication that was requested. Asking for a call back today please at Ph# [phone] ?

## 2021-07-05 ENCOUNTER — Ambulatory Visit: Payer: Self-pay

## 2021-07-05 NOTE — Telephone Encounter (Signed)
? ?  Chief Complaint: Missing medication ?Symptoms:  ?Frequency:  ?Pertinent Negatives: Patient denies  ?Disposition: [] ED /[] Urgent Care (no appt availability in office) / [] Appointment(In office/virtual)/ []  [facility]/ [] Home Care/ [] Refused Recommended Disposition /[] [facility]/ [x]  Follow-up with PCP ?Additional Notes: Pt has requested a refill on Klonopin and has gotten no response. Pt called today to check on refill. Pt states that although she has seen Dr. for CPE in January she wants to be a pt at [facility]. Please advise. ? ?Reason for Disposition ? [1] Prescription refill request for ESSENTIAL medicine (i.e., likelihood of harm to patient if not taken) AND [2] triager unable to refill per department policy ? ?Answer Assessment - Initial Assessment Questions ?1. DRUG NAME: "What medicine do you need to have refilled?" ?    Klonopin ?2. REFILLS REMAINING: "How many refills are remaining?" (Note: The label on the medicine or pill bottle will show how many refills are remaining. If there are no refills remaining, then a renewal may be needed.) ?    0 ?3. EXPIRATION DATE: "What is the expiration date?" (Note: The label states when the prescription will expire, and thus can no longer be refilled.) ?    0 ?4. PRESCRIBING HCP: "Who prescribed it?" Reason: If prescribed by specialist, call should be referred to that group. ?    Dr. ?5. SYMPTOMS: "Do you have any symptoms?" ?    na ?6. PREGNANCY: "Is there any chance that you are pregnant?" "When was your last menstrual period?" ?    na ? ?Protocols used: Medication Refill and Renewal Call-A-AH ? ?

## 2021-07-15 MED ORDER — CLONAZEPAM 0.5 MG PO TABS: 0.5000 mg | ORAL_TABLET | Freq: Two times a day (BID) | ORAL | 0 refills | Status: DC | PRN

## 2021-07-26 ENCOUNTER — Other Ambulatory Visit: Payer: Self-pay | Admitting: *Deleted

## 2021-07-26 DIAGNOSIS — I10 Essential (primary) hypertension: Secondary | ICD-10-CM

## 2021-07-26 MED ORDER — METOPROLOL SUCCINATE ER 25 MG PO TB24: 25.0000 mg | ORAL_TABLET | Freq: Every day | ORAL | 0 refills | Status: DC

## 2021-08-21 ENCOUNTER — Ambulatory Visit: Payer: BC Managed Care – PPO | Admitting: Physician Assistant

## 2021-08-21 ENCOUNTER — Other Ambulatory Visit: Payer: Self-pay

## 2021-08-21 ENCOUNTER — Encounter: Payer: Self-pay | Admitting: Physician Assistant

## 2021-08-21 VITALS — BP 119/78 | HR 86 | Resp 18 | Ht 64.0 in | Wt 190.0 lb

## 2021-08-21 DIAGNOSIS — E559 Vitamin D deficiency, unspecified: Secondary | ICD-10-CM | POA: Diagnosis not present

## 2021-08-21 DIAGNOSIS — R519 Headache, unspecified: Secondary | ICD-10-CM

## 2021-08-21 MED ORDER — SUMATRIPTAN SUCCINATE 50 MG PO TABS: 50.0000 mg | ORAL_TABLET | ORAL | 0 refills | Status: DC | PRN

## 2021-08-21 NOTE — Progress Notes (Signed)
Established Patient Office Visit  Subjective   Patient ID: Cathy Cole, female    DOB: [birth year]  Age: 51 y.o. MRN: [id number]  Chief Complaint  Patient presents with   Migraine    Migraine started yesterday.Off an on migraines for 2-3 months. Reports tenitis.    States that she has been having headaches for the past 2 to 3 months.  States that she will have experience pain on her right or left temple, states that will last for a few seconds, states that she will feel lightheaded.  States that this will continue intermittently throughout the day.  States that yesterday it was more intense and when she came home from work she had to use one of her "anxiety pills" to help her relax.  Denies photophobia, nausea, vomiting.  States that she has tried Tylenol without relief, states she is unable to tolerate ibuprofen.  Does endorse that she has severe tinnitus and this has been worse lately as well.  States that she feels her sleep is poor, states that she will sleep for 2 to 3 hours and then wake up and take a while to fall back asleep.  States that she is only sleeping between 5 and 6 hours a night.  Has not tried anything for relief.  States that she drinks approximately 2 bottles of water a day, states that her stress level is good.  Does endorse that she works with screens all day, had a vision check in February, but does not wear blue blocking lenses.     Past Medical History:  Diagnosis Date   Abnormal uterine bleeding (AUB) 11/07/2014   Allergy    Anxiety    Hypertension    Menorrhagia 11/07/2014   Social History   Socioeconomic History   Marital status: Divorced    Spouse name: Not on file   Number of children: 3   Years of education: Not on file   Highest education level: Not on file  Occupational History   Not on file  Tobacco Use   Smoking status: Never   Smokeless tobacco: Never  Vaping Use   Vaping Use: Never used  Substance and Sexual Activity    Alcohol use: No   Drug use: Never   Sexual activity: Yes  Other Topics Concern   Not on file  Social History Narrative   Not on file   Social Determinants of Health   Financial Resource Strain: Not on file  Food Insecurity: Not on file  Transportation Needs: Not on file  Physical Activity: Not on file  Stress: Not on file  Social Connections: Not on file  Intimate Partner Violence: Not on file   Family History  Problem Relation Age of Onset   Hypertension Mother    Diabetes Maternal Grandmother    Hypertension Maternal Grandmother    Diabetes Other    Hypertension Other    Colon cancer Neg Hx    Colon polyps Neg Hx    Esophageal cancer Neg Hx    Rectal cancer Neg Hx    Stomach cancer Neg Hx    Allergies  Allergen Reactions   Bactrim [Sulfamethoxazole-Trimethoprim] Anaphylaxis    Chest pain      Review of Systems  Constitutional:  Negative for chills and fever.  HENT:  Positive for tinnitus. Negative for sinus pain and sore throat.   Eyes:  Negative for photophobia.  Respiratory:  Negative for cough and shortness of breath.   Cardiovascular:  Negative for chest pain.  Gastrointestinal:  Negative for nausea and vomiting.  Genitourinary: Negative.   Musculoskeletal: Negative.   Skin: Negative.   Neurological:  Positive for headaches. Negative for speech change and weakness.  Endo/Heme/Allergies: Negative.   Psychiatric/Behavioral:  Negative for depression. The patient has insomnia. The patient is not nervous/anxious.       Objective:     BP 119/78 (BP Location: Left Arm, Patient Position: Sitting, Cuff Size: Normal)   Pulse 86   Resp 18   Ht 5\' 4"  (1.626 m)   Wt 190 lb (86.2 kg)   LMP  (LMP Unknown)   SpO2 98%   BMI 32.61 kg/m    Physical Exam Vitals and nursing note reviewed.  Constitutional:      Appearance: Normal appearance.  HENT:     Head: Normocephalic and atraumatic.     Right Ear: External ear normal.     Left Ear: External ear normal.      Nose: Nose normal.     Mouth/Throat:     Mouth: Mucous membranes are moist.     Pharynx: Oropharynx is clear.  Eyes:     Extraocular Movements: Extraocular movements intact.     Conjunctiva/sclera: Conjunctivae normal.     Pupils: Pupils are equal, round, and reactive to light.  Cardiovascular:     Rate and Rhythm: Normal rate and regular rhythm.     Pulses: Normal pulses.     Heart sounds: Normal heart sounds.  Pulmonary:     Effort: Pulmonary effort is normal.     Breath sounds: Normal breath sounds.  Musculoskeletal:        General: Normal range of motion.     Cervical back: Normal range of motion and neck supple.  Skin:    General: Skin is warm and dry.  Neurological:     General: No focal deficit present.     Mental Status: She is alert and oriented to person, place, and time.     Cranial Nerves: No cranial nerve deficit.     Sensory: No sensory deficit.     Motor: No weakness.  Psychiatric:        Mood and Affect: Mood normal.        Behavior: Behavior normal.        Thought Content: Thought content normal.        Judgment: Judgment normal.      Assessment & Plan:   Problem List Items Addressed This Visit   None Visit Diagnoses     Acute nonintractable headache, unspecified headache type    -  Primary   Relevant Medications   SUMAtriptan (IMITREX) 50 MG tablet   Other Relevant Orders   Basic metabolic panel (Completed)   Vitamin D deficiency       Relevant Orders   Vitamin D, 25-hydroxy (Completed)      1. Acute nonintractable headache, unspecified headache type Trial sumatriptan, patient encouraged to work on increasing water intake, improving sleep.  Patient education given on supportive care.  Red flags given for prompt reevaluation. - Basic metabolic panel - SUMAtriptan (IMITREX) 50 MG tablet; Take 1 tablet (50 mg total) by mouth every 2 (two) hours as needed for migraine. May repeat in 2 hours if headache persists or recurs.  Dispense: 10 tablet;  Refill: 0  2. Vitamin D deficiency  - Vitamin D, 25-hydroxy   I have reviewed the patient's medical history (PMH, PSH, Social History, Family History, Medications, and allergies) , and have been updated if relevant. I  spent 30 minutes reviewing chart and  face to face time with patient.    Return if symptoms worsen or fail to improve.    Kasandra Knudsen Mayers, PA-C

## 2021-08-21 NOTE — Patient Instructions (Signed)
You are going to use sumatriptan to help you with your headache.  I encourage you to increase your water intake, you should be drinking at least 64 ounces of water a day.  I encourage you to work on improving your sleep, you should be sleeping at least 7 to 8 hours a night.  I encourage you to use melatonin over-the-counter to help you with your sleep.  We will call you with today's lab results.  Roney Jaffe, PA-C Physician Assistant [facility] [email or web address]   General Headache Without Cause A headache is pain or discomfort felt around the head or neck area. There are many causes and types of headaches. A few common types include: Tension headaches. Migraine headaches. Cluster headaches. Chronic daily headaches. Sometimes, the specific cause of a headache may not be found. Follow these instructions at home: Watch your condition for any changes. Let your health care provider know about them. Take these steps to help with your condition: Managing pain     Take over-the-counter and prescription medicines only as told by your health care provider. Treatment may include medicines for pain that are taken by mouth or applied to the skin. Lie down in a dark, quiet room when you have a headache. Keep lights dim if bright lights bother you or make your headaches worse. If directed, put ice on your head and neck area: Put ice in a plastic bag. Place a towel between your skin and the bag. Leave the ice on for 20 minutes, 2-3 times per day. Remove the ice if your skin turns bright red. This is very important. If you cannot feel pain, heat, or cold, you have a greater risk of damage to the area. If directed, apply heat to the affected area. Use the heat source that your health care provider recommends, such as a moist heat pack or a heating pad. Place a towel between your skin and the heat source. Leave the heat on for 20-30  minutes. Remove the heat if your skin turns bright red. This is especially important if you are unable to feel pain, heat, or cold. You have a greater risk of getting burned. Eating and drinking Eat meals on a regular schedule. If you drink alcohol: Limit how much you have to: 0-1 drink a day for women who are not pregnant. 0-2 drinks a day for men. Know how much alcohol is in a drink. In the [place] one drink equals one 12 oz bottle of beer (355 mL), one 5 oz glass of wine (148 mL), or one 1 oz glass of hard liquor (44 mL). Stop drinking caffeine, or decrease the amount of caffeine you drink. Drink enough fluid to keep your urine pale yellow. General instructions  Keep a headache journal to help find out what may trigger your headaches. For example, write down: What you eat and drink. How much sleep you get. Any change to your diet or medicines. Try massage or other relaxation techniques. Limit stress. Sit up straight, and do not tense your muscles. Do not use any products that contain nicotine or tobacco. These products include cigarettes, chewing tobacco, and vaping devices, such as e-cigarettes. If you need help quitting, ask your health care provider. Exercise regularly as told by your health care provider. Sleep on a regular schedule. Get 7-9 hours of sleep each night, or the amount recommended by your health care provider. Keep all follow-up visits. This is important. Contact a health care provider if: Medicine does not  help your symptoms. You have a headache that is different from your usual headache. You have nausea or you vomit. You have a fever. Get help right away if: Your headache: Becomes severe quickly. Gets worse after moderate to intense physical activity. You have any of these symptoms: Repeated vomiting. Pain or stiffness in your neck. Changes to your vision. Pain in an eye or ear. Problems with speech. Muscular weakness or loss of muscle control. Loss of  balance or coordination. You feel faint or pass out. You have confusion. You have a seizure. These symptoms may represent a serious problem that is an emergency. Do not wait to see if the symptoms will go away. Get medical help right away. Call your local emergency services (911 in the [place] Do not drive yourself to the hospital. Summary A headache is pain or discomfort felt around the head or neck area. There are many causes and types of headaches. In some cases, the cause may not be found. Keep a headache journal to help find out what may trigger your headaches. Watch your condition for any changes. Let your health care provider know about them. Contact a health care provider if you have a headache that is different from the usual headache, or if your symptoms are not helped by medicine. Get help right away if your headache becomes severe, you vomit, you have a loss of vision, you lose your balance, or you have a seizure. This information is not intended to replace advice given to you by your health care provider. Make sure you discuss any questions you have with your health care provider. Document Revised: 08/01/2020 Document Reviewed: 08/01/2020 Elsevier Patient Education  2023 [employer].

## 2021-08-22 ENCOUNTER — Telehealth: Payer: Self-pay

## 2021-08-22 LAB — BASIC METABOLIC PANEL
BUN/Creatinine Ratio: 17 (ref 9–23)
BUN: 13 mg/dL (ref 6–24)
CO2: 24 mmol/L (ref 20–29)
Calcium: 9.8 mg/dL (ref 8.7–10.2)
Chloride: 103 mmol/L (ref 96–106)
Creatinine, Ser: 0.77 mg/dL (ref 0.57–1.00)
Glucose: 101 mg/dL — ABNORMAL HIGH (ref 70–99)
Potassium: 3.7 mmol/L (ref 3.5–5.2)
Sodium: 141 mmol/L (ref 134–144)
eGFR: 94 mL/min/{1.73_m2} (ref >59)

## 2021-08-22 LAB — VITAMIN D 25 HYDROXY (VIT D DEFICIENCY, FRACTURES): Vit D, 25-Hydroxy: 26 ng/mL — ABNORMAL LOW (ref 30.0–100.0)

## 2021-08-22 NOTE — Telephone Encounter (Signed)
-----   Message from Cari S Mayers, PA-C sent at 08/22/2021  9:36 AM EDT ----- Please call patient and let her know that her kidney function, and potassium levels are within normal limits.  Her vitamin D level is slightly below normal limits, I encouraged her to take 2000 units once daily.  She will purchase this over-the-counter. 

## 2021-08-22 NOTE — Telephone Encounter (Signed)
-----   Message from Roney Jaffe, [place] sent at 08/22/2021  9:36 AM EDT ----- Please call patient and let her know that her kidney function, and potassium levels are within normal limits.  Her vitamin D level is slightly below normal limits, I encouraged her to take 2000 units once daily.  She will purchase this over-the-counter.

## 2021-08-23 DIAGNOSIS — N8189 Other female genital prolapse: Secondary | ICD-10-CM | POA: Diagnosis not present

## 2021-09-06 DIAGNOSIS — H90A31 Mixed conductive and sensorineural hearing loss, unilateral, right ear with restricted hearing on the contralateral side: Secondary | ICD-10-CM | POA: Diagnosis not present

## 2021-09-06 DIAGNOSIS — T85698A Other mechanical complication of other specified internal prosthetic devices, implants and grafts, initial encounter: Secondary | ICD-10-CM | POA: Insufficient documentation

## 2021-09-06 DIAGNOSIS — H6121 Impacted cerumen, right ear: Secondary | ICD-10-CM | POA: Diagnosis not present

## 2021-09-06 DIAGNOSIS — Z9089 Acquired absence of other organs: Secondary | ICD-10-CM | POA: Diagnosis not present

## 2021-09-24 DIAGNOSIS — H90A31 Mixed conductive and sensorineural hearing loss, unilateral, right ear with restricted hearing on the contralateral side: Secondary | ICD-10-CM | POA: Diagnosis not present

## 2021-09-24 DIAGNOSIS — T85698A Other mechanical complication of other specified internal prosthetic devices, implants and grafts, initial encounter: Secondary | ICD-10-CM | POA: Diagnosis not present

## 2021-09-24 DIAGNOSIS — H6982 Other specified disorders of Eustachian tube, left ear: Secondary | ICD-10-CM | POA: Diagnosis not present

## 2021-09-24 DIAGNOSIS — Z9089 Acquired absence of other organs: Secondary | ICD-10-CM | POA: Diagnosis not present

## 2021-10-07 ENCOUNTER — Other Ambulatory Visit: Payer: Self-pay | Admitting: Family Medicine

## 2021-10-07 DIAGNOSIS — I10 Essential (primary) hypertension: Secondary | ICD-10-CM

## 2021-10-19 DIAGNOSIS — R5383 Other fatigue: Secondary | ICD-10-CM | POA: Diagnosis not present

## 2021-10-19 DIAGNOSIS — I10 Essential (primary) hypertension: Secondary | ICD-10-CM | POA: Diagnosis not present

## 2021-10-19 DIAGNOSIS — Z713 Dietary counseling and surveillance: Secondary | ICD-10-CM | POA: Diagnosis not present

## 2021-10-19 DIAGNOSIS — E639 Nutritional deficiency, unspecified: Secondary | ICD-10-CM | POA: Diagnosis not present

## 2021-10-19 DIAGNOSIS — E6609 Other obesity due to excess calories: Secondary | ICD-10-CM | POA: Diagnosis not present

## 2021-10-26 DIAGNOSIS — Z713 Dietary counseling and surveillance: Secondary | ICD-10-CM | POA: Diagnosis not present

## 2021-10-26 DIAGNOSIS — I10 Essential (primary) hypertension: Secondary | ICD-10-CM | POA: Diagnosis not present

## 2021-10-26 DIAGNOSIS — E639 Nutritional deficiency, unspecified: Secondary | ICD-10-CM | POA: Diagnosis not present

## 2021-10-26 DIAGNOSIS — E6609 Other obesity due to excess calories: Secondary | ICD-10-CM | POA: Diagnosis not present

## 2021-10-26 DIAGNOSIS — R5383 Other fatigue: Secondary | ICD-10-CM | POA: Diagnosis not present

## 2021-11-02 DIAGNOSIS — Z713 Dietary counseling and surveillance: Secondary | ICD-10-CM | POA: Diagnosis not present

## 2021-11-02 DIAGNOSIS — I10 Essential (primary) hypertension: Secondary | ICD-10-CM | POA: Diagnosis not present

## 2021-11-02 DIAGNOSIS — E639 Nutritional deficiency, unspecified: Secondary | ICD-10-CM | POA: Diagnosis not present

## 2021-11-02 DIAGNOSIS — R7301 Impaired fasting glucose: Secondary | ICD-10-CM | POA: Diagnosis not present

## 2021-11-02 DIAGNOSIS — E6609 Other obesity due to excess calories: Secondary | ICD-10-CM | POA: Diagnosis not present

## 2021-11-04 ENCOUNTER — Ambulatory Visit: Payer: BC Managed Care – PPO | Admitting: Family Medicine

## 2021-11-04 ENCOUNTER — Encounter: Payer: Self-pay | Admitting: Family Medicine

## 2021-11-04 VITALS — BP 115/80 | HR 96 | Temp 98.1°F | Resp 16 | Ht 64.0 in | Wt 185.0 lb

## 2021-11-04 DIAGNOSIS — F411 Generalized anxiety disorder: Secondary | ICD-10-CM | POA: Diagnosis not present

## 2021-11-04 DIAGNOSIS — I10 Essential (primary) hypertension: Secondary | ICD-10-CM | POA: Diagnosis not present

## 2021-11-04 MED ORDER — CLONAZEPAM 0.5 MG PO TABS: 0.5000 mg | ORAL_TABLET | Freq: Two times a day (BID) | ORAL | 0 refills | Status: DC | PRN

## 2021-11-05 ENCOUNTER — Encounter: Payer: Self-pay | Admitting: Family Medicine

## 2021-11-05 NOTE — Progress Notes (Signed)
Established Patient Office Visit  Subjective    Patient ID: Cathy Cole, female    DOB: 1971/03/01  Age: 51 y.o. MRN: [id number]  CC:  Chief Complaint  Patient presents with   Hypertension   Weight Loss    HPI Cathy Cole presents for follow up of hypertension. Patient denies acute complaints or concerns.    Outpatient Encounter Medications as of 11/04/2021  Medication Sig   [DISCONTINUED] clonazePAM (KLONOPIN) 0.5 MG tablet Take 1 tablet by mouth 2 (two) times daily as needed.   acetaminophen (TYLENOL) 500 MG tablet Take 1,000 mg by mouth every 6 (six) hours as needed for mild pain, moderate pain or headache.   clonazePAM (KLONOPIN) 0.5 MG tablet Take 1 tablet (0.5 mg total) by mouth 2 (two) times daily as needed.   metoprolol succinate (TOPROL-XL) 25 MG 24 hr tablet TAKE 1 TABLET (25 MG TOTAL) BY MOUTH DAILY.   valsartan-hydrochlorothiazide (DIOVAN HCT) 80-12.5 MG tablet Take 1 tablet by mouth daily.   [DISCONTINUED] clonazePAM (KLONOPIN) 0.5 MG tablet Take 1 tablet (0.5 mg total) by mouth 2 (two) times daily as needed for anxiety.   [DISCONTINUED] SUMAtriptan (IMITREX) 50 MG tablet Take 1 tablet (50 mg total) by mouth every 2 (two) hours as needed for migraine. May repeat in 2 hours if headache persists or recurs.   No facility-administered encounter medications on file as of 11/04/2021.    Past Medical History:  Diagnosis Date   Abnormal uterine bleeding (AUB) 11/07/2014   Allergy    Anxiety    Hypertension    Menorrhagia 11/07/2014    Past Surgical History:  Procedure Laterality Date   BUNIONECTOMY     COSMETIC SURGERY     abdominoplasty   EAR CYST EXCISION Right 2021   tubes left ear   NOVASURE ABLATION N/A 11/08/2014   Procedure: NOVASURE ABLATION;  Surgeon: Sherian Rein, MD;  Location: [facility];  Service: Gynecology;  Laterality: N/A;   TUBAL LIGATION      Family History  Problem Relation Age of Onset   Hypertension Mother    Diabetes Maternal  Grandmother    Hypertension Maternal Grandmother    Diabetes Other    Hypertension Other    Colon cancer Neg Hx    Colon polyps Neg Hx    Esophageal cancer Neg Hx    Rectal cancer Neg Hx    Stomach cancer Neg Hx     Social History   Socioeconomic History   Marital status: Divorced    Spouse name: Not on file   Number of children: 3   Years of education: Not on file   Highest education level: Not on file  Occupational History   Not on file  Tobacco Use   Smoking status: Never   Smokeless tobacco: Never  Vaping Use   Vaping Use: Never used  Substance and Sexual Activity   Alcohol use: No   Drug use: Never   Sexual activity: Yes  Other Topics Concern   Not on file  Social History Narrative   Not on file   Social Determinants of Health   Financial Resource Strain: Not on file  Food Insecurity: Not on file  Transportation Needs: Not on file  Physical Activity: Not on file  Stress: Not on file  Social Connections: Not on file  Intimate Partner Violence: Not on file    Review of Systems  All other systems reviewed and are negative.       Objective    BP  115/80   Pulse 96   Temp 98.1 F (36.7 C) (Oral)   Resp 16   Ht 5\' 4"  (1.626 m)   Wt 185 lb (83.9 kg)   LMP  (LMP Unknown)   SpO2 98%   BMI 31.76 kg/m   Physical Exam Vitals and nursing note reviewed.  Constitutional:      General: She is not in acute distress. Cardiovascular:     Rate and Rhythm: Normal rate and regular rhythm.  Pulmonary:     Effort: Pulmonary effort is normal.     Breath sounds: Normal breath sounds.  Abdominal:     Palpations: Abdomen is soft.     Tenderness: There is no abdominal tenderness.  Musculoskeletal:     Right lower leg: No edema.     Left lower leg: No edema.  Neurological:     General: No focal deficit present.     Mental Status: She is alert and oriented to person, place, and time.  Psychiatric:        Mood and Affect: Mood normal.        Behavior:  Behavior normal.         Assessment & Plan:   1. Essential hypertension Appears stable. Continue and monitor  2. Generalized anxiety disorder Appears stable. Meds refilled.     Return in about 6 months (around 05/07/2022) for follow up.   05/09/2022, MD

## 2022-01-09 ENCOUNTER — Other Ambulatory Visit: Payer: Self-pay | Admitting: Family Medicine

## 2022-01-09 DIAGNOSIS — I10 Essential (primary) hypertension: Secondary | ICD-10-CM

## 2022-01-09 DIAGNOSIS — N393 Stress incontinence (female) (male): Secondary | ICD-10-CM | POA: Diagnosis not present

## 2022-01-09 DIAGNOSIS — N812 Incomplete uterovaginal prolapse: Secondary | ICD-10-CM | POA: Insufficient documentation

## 2022-01-24 ENCOUNTER — Other Ambulatory Visit: Payer: Self-pay | Admitting: Family Medicine

## 2022-01-24 NOTE — Telephone Encounter (Signed)
Requested medication (s) are due for refill today -yes  Requested medication (s) are on the active medication list -yes  Future visit scheduled -yes  Last refill: 11/04/21 #30  Notes to clinic: non delegated Rx  Requested Prescriptions  Pending Prescriptions Disp Refills   clonazePAM (KLONOPIN) 0.5 MG tablet [Pharmacy Med Name: CLONAZEPAM 0.5 MG TABLET] 30 tablet 0    Sig: TAKE 1 TABLET BY MOUTH 2 TIMES DAILY AS NEEDED.     Not Delegated - Psychiatry: Anxiolytics/Hypnotics 2 Failed - 01/24/2022 12:43 PM      Failed - This refill cannot be delegated      Failed - Urine Drug Screen completed in last 360 days      Passed - Patient is not pregnant      Passed - Valid encounter within last 6 months    Recent Outpatient Visits           2 months ago Essential hypertension   Primary Care at [facility], Lauris Poag, MD   11 months ago Left knee pain, unspecified chronicity   Primary Care at [facility], MD   1 year ago Essential hypertension   Primary Care at [facility], MD   1 year ago Muscle spasm   Primary Care at [facility], [place], NP   1 year ago Encounter to establish care   Primary Care at [facility], Kandee Keen, MD       Future Appointments             In 2 weeks Florian Buff Rochel Brome, MD Urogynecology at [facility], [facility]   In 2 months Sagardia, Eilleen Kempf, MD [facility] [place]   In 3 months Georganna Skeans, MD Primary Care at [facility]               Requested Prescriptions  Pending Prescriptions Disp Refills   clonazePAM (KLONOPIN) 0.5 MG tablet [Pharmacy Med Name: CLONAZEPAM 0.5 MG TABLET] 30 tablet 0    Sig: TAKE 1 TABLET BY MOUTH 2 TIMES DAILY AS NEEDED.     Not Delegated - Psychiatry: Anxiolytics/Hypnotics 2 Failed - 01/24/2022 12:43 PM      Failed - This refill cannot be delegated      Failed - Urine Drug Screen completed in last 360  days      Passed - Patient is not pregnant      Passed - Valid encounter within last 6 months    Recent Outpatient Visits           2 months ago Essential hypertension   Primary Care at [facility], Lauris Poag, MD   11 months ago Left knee pain, unspecified chronicity   Primary Care at [facility], MD   1 year ago Essential hypertension   Primary Care at [facility], MD   1 year ago Muscle spasm   Primary Care at [facility], [place], NP   1 year ago Encounter to establish care   Primary Care at [facility], Kandee Keen, MD       Future Appointments             In 2 weeks Florian Buff Rochel Brome, MD Urogynecology at [facility], [facility]   In 2 months Sagardia, Eilleen Kempf, MD [facility] [place]   In 3 months Georganna Skeans, MD Primary Care at [facility]

## 2022-01-28 ENCOUNTER — Other Ambulatory Visit: Payer: Self-pay | Admitting: Family Medicine

## 2022-01-28 ENCOUNTER — Encounter: Payer: Self-pay | Admitting: Family Medicine

## 2022-01-28 MED ORDER — CLONAZEPAM 0.5 MG PO TABS: 0.5000 mg | ORAL_TABLET | Freq: Two times a day (BID) | ORAL | 0 refills | Status: DC | PRN

## 2022-01-30 ENCOUNTER — Ambulatory Visit
Admission: RE | Admit: 2022-01-30 | Discharge: 2022-01-30 | Disposition: A | Discharge status: Home / Self Care | Payer: BC Managed Care – PPO | Source: Ambulatory Visit | Attending: Physician Assistant | Admitting: Physician Assistant

## 2022-01-30 ENCOUNTER — Other Ambulatory Visit: Payer: Self-pay

## 2022-01-30 VITALS — BP 129/83 | HR 87 | Temp 98.0°F | Resp 18

## 2022-01-30 DIAGNOSIS — M549 Dorsalgia, unspecified: Secondary | ICD-10-CM

## 2022-01-30 LAB — POCT URINALYSIS DIP (MANUAL ENTRY)
Bilirubin, UA: NEGATIVE
Glucose, UA: NEGATIVE mg/dL
Ketones, POC UA: NEGATIVE mg/dL
Leukocytes, UA: NEGATIVE
Nitrite, UA: NEGATIVE
Protein Ur, POC: NEGATIVE mg/dL
Spec Grav, UA: 1.025 (ref 1.010–1.025)
Urobilinogen, UA: 1 E.U./dL
pH, UA: 6.5 (ref 5.0–8.0)

## 2022-01-30 MED ORDER — CYCLOBENZAPRINE HCL 10 MG PO TABS: 10.0000 mg | ORAL_TABLET | Freq: Two times a day (BID) | ORAL | 0 refills | Status: DC | PRN

## 2022-01-30 MED ORDER — PREDNISONE 20 MG PO TABS: 40.0000 mg | ORAL_TABLET | Freq: Every day | ORAL | 0 refills | Status: AC

## 2022-01-30 NOTE — ED Provider Notes (Signed)
[facility]    CSN: [id number] Arrival date & time: 01/30/22  1758      History   Chief Complaint Chief Complaint  Patient presents with   Generalized Body Aches    Pain in my left side and back below my rib cage for about 2 weeks. Not getting any better. It hurts with most movements. - Entered by patient    HPI Cathy Cole is a 51 y.o. female.   Patient here for evaluation of left-sided low back pain that started about 2 weeks ago.  She reports that movement makes symptoms worse.  She denies pain elsewhere.  She has tried taking over-the-counter medication without significant improvement.  She denies any known injury.  She denies any blood in her urine.  The history is provided by the patient.    Past Medical History:  Diagnosis Date   Abnormal uterine bleeding (AUB) 11/07/2014   Allergy    Anxiety    Hypertension    Menorrhagia 11/07/2014    Patient Active Problem List   Diagnosis Date Noted   Extrusion of tympanostomy tube 09/06/2021   Easy bruising 04/12/2021   Paresthesia 01/01/2021   Fasciculation 01/01/2021   History of right mastoidectomy 12/05/2020   Eustachian tube dysfunction, left 01/20/2020   Mixed conductive and sensorineural hearing loss of right ear with restricted hearing of left ear 06/07/2019   Essential hypertension 08/10/2018   Generalized anxiety disorder 05/10/2018    Past Surgical History:  Procedure Laterality Date   BUNIONECTOMY     COSMETIC SURGERY     abdominoplasty   EAR CYST EXCISION Right 2021   tubes left ear   NOVASURE ABLATION N/A 11/08/2014   Procedure: NOVASURE ABLATION;  Surgeon: Sherian Rein, MD;  Location: [facility];  Service: Gynecology;  Laterality: N/A;   TUBAL LIGATION      OB History   No obstetric history on file.      Home Medications    Prior to Admission medications   Medication Sig Start Date End Date Taking? Authorizing Provider  cyclobenzaprine (FLEXERIL) 10 MG tablet Take 1  tablet (10 mg total) by mouth 2 (two) times daily as needed for muscle spasms. 01/30/22  Yes Tomi Bamberger, PA-C  predniSONE (DELTASONE) 20 MG tablet Take 2 tablets (40 mg total) by mouth daily with breakfast for 5 days. 01/30/22 02/04/22 Yes Tomi Bamberger, PA-C  acetaminophen (TYLENOL) 500 MG tablet Take 1,000 mg by mouth every 6 (six) hours as needed for mild pain, moderate pain or headache.    [provider]  clonazePAM (KLONOPIN) 0.5 MG tablet Take 1 tablet (0.5 mg total) by mouth 2 (two) times daily as needed. 01/28/22   Georganna Skeans, MD  metoprolol succinate (TOPROL-XL) 25 MG 24 hr tablet TAKE 1 TABLET (25 MG TOTAL) BY MOUTH DAILY. 01/10/22   Georganna Skeans, MD  valsartan-hydrochlorothiazide (DIOVAN HCT) 80-12.5 MG tablet Take 1 tablet by mouth daily. 03/20/21 06/18/21  Georgina Quint, MD    Family History Family History  Problem Relation Age of Onset   Hypertension Mother    Diabetes Maternal Grandmother    Hypertension Maternal Grandmother    Diabetes Other    Hypertension Other    Colon cancer Neg Hx    Colon polyps Neg Hx    Esophageal cancer Neg Hx    Rectal cancer Neg Hx    Stomach cancer Neg Hx     Social History Social History   Tobacco Use   Smoking status:  Never   Smokeless tobacco: Never  Vaping Use   Vaping Use: Never used  Substance Use Topics   Alcohol use: No   Drug use: Never     Allergies   Sulfamethoxazole-trimethoprim   Review of Systems Review of Systems  Constitutional:  Negative for chills and fever.  Eyes:  Negative for discharge and redness.  Respiratory:  Negative for shortness of breath.   Gastrointestinal:  Negative for nausea and vomiting.  Musculoskeletal:  Positive for back pain and myalgias.  Neurological:  Negative for numbness.     Physical Exam Triage Vital Signs ED Triage Vitals  Enc Vitals Group     BP 01/30/22 1817 129/83     Pulse Rate 01/30/22 1817 87     Resp 01/30/22 1817 18     Temp  01/30/22 1817 98 F (36.7 C)     Temp Source 01/30/22 1817 Oral     SpO2 01/30/22 1817 98 %     Weight --      Height --      Head Circumference --      Peak Flow --      Pain Score 01/30/22 1818 4     Pain Loc --      Pain Edu? --      Excl. in GC? --    No data found.  Updated Vital Signs BP 129/83 (BP Location: Left Arm)   Pulse 87   Temp 98 F (36.7 C) (Oral)   Resp 18   LMP  (LMP Unknown)   SpO2 98%   Visual Acuity Right Eye Distance:   Left Eye Distance:   Bilateral Distance:    Right Eye Near:   Left Eye Near:    Bilateral Near:     Physical Exam Vitals and nursing note reviewed.  Constitutional:      General: She is not in acute distress.    Appearance: Normal appearance. She is not ill-appearing.  HENT:     Head: Normocephalic and atraumatic.  Eyes:     Conjunctiva/sclera: Conjunctivae normal.  Cardiovascular:     Rate and Rhythm: Normal rate.  Pulmonary:     Effort: Pulmonary effort is normal. No respiratory distress.  Musculoskeletal:     Comments: No TTP to midline spine, mild TTP to right flank with muscle spasm noted  Neurological:     Mental Status: She is alert.      UC Treatments / Results  Labs (all labs ordered are listed, but only abnormal results are displayed) Labs Reviewed  POCT URINALYSIS DIP (MANUAL ENTRY) - Abnormal; Notable for the following components:      Result Value   Blood, UA trace-lysed (*)    All other components within normal limits    EKG   Radiology No results found.  Procedures Procedures (including critical care time)  Medications Ordered in UC Medications - No data to display  Initial Impression / Assessment and Plan / UC Course  I have reviewed the triage vital signs and the nursing notes.  Pertinent labs & imaging results that were available during my care of the patient were reviewed by me and considered in my medical decision making (see chart for details).    Muscle relaxer and steroid  prescribed to cover suspected muscle strain.  Discussed use of heat and massage to also help with muscle pain.  Encouraged follow-up with any further concerns.  Final Clinical Impressions(s) / UC Diagnoses   Final diagnoses:  Acute left-sided back  pain, unspecified back location   Discharge Instructions   None    ED Prescriptions     Medication Sig Dispense Auth. Provider   predniSONE (DELTASONE) 20 MG tablet Take 2 tablets (40 mg total) by mouth daily with breakfast for 5 days. 10 tablet Erma Pinto F, PA-C   cyclobenzaprine (FLEXERIL) 10 MG tablet Take 1 tablet (10 mg total) by mouth 2 (two) times daily as needed for muscle spasms. 20 tablet Tomi Bamberger, PA-C      PDMP not reviewed this encounter.   Tomi Bamberger, PA-C 01/30/22 2025

## 2022-01-30 NOTE — ED Triage Notes (Signed)
Pt here for left sided flank and back pain x 2 weeks

## 2022-02-07 DIAGNOSIS — R195 Other fecal abnormalities: Secondary | ICD-10-CM | POA: Diagnosis not present

## 2022-02-07 DIAGNOSIS — K5900 Constipation, unspecified: Secondary | ICD-10-CM | POA: Diagnosis not present

## 2022-02-07 DIAGNOSIS — R109 Unspecified abdominal pain: Secondary | ICD-10-CM | POA: Diagnosis not present

## 2022-02-11 ENCOUNTER — Ambulatory Visit: Payer: Self-pay

## 2022-02-11 ENCOUNTER — Ambulatory Visit: Payer: BC Managed Care – PPO | Admitting: Family Medicine

## 2022-02-11 ENCOUNTER — Encounter: Payer: Self-pay | Admitting: Family Medicine

## 2022-02-11 VITALS — BP 119/85 | HR 86 | Temp 97.1°F | Resp 16 | Wt 183.6 lb

## 2022-02-11 DIAGNOSIS — R1012 Left upper quadrant pain: Secondary | ICD-10-CM | POA: Diagnosis not present

## 2022-02-11 NOTE — Progress Notes (Unsigned)
Established Patient Office Visit  Subjective    Patient ID: Cathy Cole, female    DOB: [birth year]  Age: 51 y.o. MRN: [id number]  CC:  Chief Complaint  Patient presents with   Back Pain    HPI Cathy Cole presents for persistent LUQ abdominal pain for about 4 weeks. She denies known trauma or injury. Denies GI or Pulmonary or GU sx. Patient denies fever/chills. Patient reports that sx are constant without much exacerbations.   Outpatient Encounter Medications as of 02/11/2022  Medication Sig   acetaminophen (TYLENOL) 500 MG tablet Take 1,000 mg by mouth every 6 (six) hours as needed for mild pain, moderate pain or headache.   clonazePAM (KLONOPIN) 0.5 MG tablet Take 1 tablet (0.5 mg total) by mouth 2 (two) times daily as needed.   cyclobenzaprine (FLEXERIL) 10 MG tablet Take 1 tablet (10 mg total) by mouth 2 (two) times daily as needed for muscle spasms.   metoprolol succinate (TOPROL-XL) 25 MG 24 hr tablet TAKE 1 TABLET (25 MG TOTAL) BY MOUTH DAILY.   SUMAtriptan (IMITREX) 50 MG tablet    valsartan-hydrochlorothiazide (DIOVAN HCT) 80-12.5 MG tablet Take 1 tablet by mouth daily.   No facility-administered encounter medications on file as of 02/11/2022.    Past Medical History:  Diagnosis Date   Abnormal uterine bleeding (AUB) 11/07/2014   Allergy    Anxiety    Hypertension    Menorrhagia 11/07/2014    Past Surgical History:  Procedure Laterality Date   BUNIONECTOMY     COSMETIC SURGERY     abdominoplasty   EAR CYST EXCISION Right 2021   tubes left ear   NOVASURE ABLATION N/A 11/08/2014   Procedure: NOVASURE ABLATION;  Surgeon: Sherian Rein, MD;  Location: [facility];  Service: Gynecology;  Laterality: N/A;   TUBAL LIGATION      Family History  Problem Relation Age of Onset   Hypertension Mother    Diabetes Maternal Grandmother    Hypertension Maternal Grandmother    Diabetes Other    Hypertension Other    Colon cancer Neg Hx    Colon polyps Neg Hx     Esophageal cancer Neg Hx    Rectal cancer Neg Hx    Stomach cancer Neg Hx     Social History   Socioeconomic History   Marital status: Divorced    Spouse name: Not on file   Number of children: 3   Years of education: Not on file   Highest education level: Not on file  Occupational History   Not on file  Tobacco Use   Smoking status: Never   Smokeless tobacco: Never  Vaping Use   Vaping Use: Never used  Substance and Sexual Activity   Alcohol use: No   Drug use: Never   Sexual activity: Yes  Other Topics Concern   Not on file  Social History Narrative   Not on file   Social Determinants of Health   Financial Resource Strain: Not on file  Food Insecurity: Not on file  Transportation Needs: Not on file  Physical Activity: Not on file  Stress: Not on file  Social Connections: Not on file  Intimate Partner Violence: Not on file    Review of Systems  Gastrointestinal: Negative.   Genitourinary: Negative.   All other systems reviewed and are negative.       Objective    BP 119/85   Pulse 86   Temp (!) 97.1 F (36.2 C) (Oral)   Resp 16  Wt 183 lb 9.6 oz (83.3 kg)   LMP  (LMP Unknown)   SpO2 93%   BMI 31.51 kg/m   Physical Exam Vitals and nursing note reviewed.  Constitutional:      General: She is not in acute distress. Cardiovascular:     Rate and Rhythm: Normal rate and regular rhythm.  Pulmonary:     Effort: Pulmonary effort is normal.     Breath sounds: Normal breath sounds.  Chest:     Chest wall: No mass, deformity, swelling or tenderness.  Abdominal:     Palpations: Abdomen is soft.     Tenderness: There is no abdominal tenderness.  Neurological:     General: No focal deficit present.     Mental Status: She is alert and oriented to person, place, and time.     {Labs (Optional):23779}    Assessment & Plan:   Problem List Items Addressed This Visit   None   No follow-ups on file.   Becky Sax, MD

## 2022-02-11 NOTE — Progress Notes (Unsigned)
pain in the back for awhile 4 wks Patient has been taking  otc meds  Patient said pain is on  left side

## 2022-02-11 NOTE — Telephone Encounter (Signed)
  Chief Complaint: chest wall pain  Symptoms: L chest wall pain, rib cage area on front and back, constant but gets worse depending on movement  Frequency: 4 weeks  Pertinent Negatives: Patient denies pain with breathing or actual chest  Disposition: [] ED /[] Urgent Care (no appt availability in office) / [x] Appointment(In office/virtual)/ []  [facility]/ [] Home Care/ [] Refused Recommended Disposition /[] [facility]/ []  Follow-up with PCP Additional Notes: pt states she has been to UC, went Friday and was told from XR results she had constipation. Pt states she done laxative and self cleansing and had slight relief but still has same nagging pain. Tried to scheduled appt for today but d/t overbooking with Amy, NP had to call [place], FC. Appt with Amy, NP had already been taken but she advised that Dr. had cancellation today at 1300 and I asked pt if she could come to appt. Pt said yes and Erica, FC scheduled appt. Pt verbalized understanding.    Summary: pain on the left side above the rib cage, the front side, and the back.   Pt stated she is experiencing pain on the left side above the rib cage, the front side, and the back. not in chest at all. Pt stated this is about her 4th week dealing with this. Pt mentioned she has been to urgent care twice and has been given muscle relaxers and steroids. However, she is still having pain at this moment; her pain is a 3.   No appointments are available.     Pt seeking clinical advice.     Reason for Disposition  [1] Chest pain lasts > 5 minutes AND [2] occurred > 3 days ago (72 hours) AND [3] NO chest pain or cardiac symptoms now  Answer Assessment - Initial Assessment Questions 1. LOCATION: "Where does it hurt?"       L rib cage area  2. RADIATION: "Does the pain go anywhere else?" (e.g., into neck, jaw, arms, back)     Front and around the back  3. ONSET: "When did the chest pain begin?" (Minutes, hours or days)       4 weeks  4. PATTERN: "Does the pain come and go, or has it been constant since it started?"  "Does it get worse with exertion?"      constant 6. SEVERITY: "How bad is the pain?"  (e.g., Scale 1-10; mild, moderate, or severe)    - MILD (1-3): doesn't interfere with normal activities     - MODERATE (4-7): interferes with normal activities or awakens from sleep    - SEVERE (8-10): excruciating pain, unable to do any normal activities       3 10. OTHER SYMPTOMS: "Do you have any other symptoms?" (e.g., dizziness, nausea, vomiting, sweating, fever, difficulty breathing, cough)  Protocols used: Chest Pain-A-AH

## 2022-02-12 ENCOUNTER — Encounter: Payer: Self-pay | Admitting: Family Medicine

## 2022-02-12 ENCOUNTER — Ambulatory Visit: Payer: BC Managed Care – PPO | Admitting: Obstetrics and Gynecology

## 2022-02-14 DIAGNOSIS — H90A31 Mixed conductive and sensorineural hearing loss, unilateral, right ear with restricted hearing on the contralateral side: Secondary | ICD-10-CM | POA: Diagnosis not present

## 2022-02-14 DIAGNOSIS — Z9089 Acquired absence of other organs: Secondary | ICD-10-CM | POA: Diagnosis not present

## 2022-02-14 DIAGNOSIS — H9313 Tinnitus, bilateral: Secondary | ICD-10-CM | POA: Insufficient documentation

## 2022-02-14 DIAGNOSIS — H6121 Impacted cerumen, right ear: Secondary | ICD-10-CM | POA: Diagnosis not present

## 2022-02-19 ENCOUNTER — Telehealth: Payer: Self-pay | Admitting: *Deleted

## 2022-02-19 NOTE — Telephone Encounter (Signed)
Patient called and given appt for ultrasound. Patient given instruction per scheduler. Patient understood.

## 2022-03-03 ENCOUNTER — Ambulatory Visit
Admission: RE | Admit: 2022-03-03 | Discharge: 2022-03-03 | Disposition: A | Discharge status: Home / Self Care | Payer: BC Managed Care – PPO | Source: Ambulatory Visit | Attending: Family Medicine | Admitting: Family Medicine

## 2022-03-03 DIAGNOSIS — R1012 Left upper quadrant pain: Secondary | ICD-10-CM | POA: Diagnosis not present

## 2022-03-03 DIAGNOSIS — R160 Hepatomegaly, not elsewhere classified: Secondary | ICD-10-CM | POA: Diagnosis not present

## 2022-03-06 ENCOUNTER — Encounter: Payer: Self-pay | Admitting: Family Medicine

## 2022-03-06 ENCOUNTER — Telehealth: Payer: Self-pay | Admitting: *Deleted

## 2022-03-06 NOTE — Telephone Encounter (Signed)
Reason for CRM: Pt called to discuss her [place] results, please advise     Copied from CRM [id number]. Topic: General - Other >> Mar 05, 2022  4:12 PM Franchot Heidelberg wrote: Reason for CRM: Pt called to discuss her [place] results, please advise  Best contact: >> Mar 05, 2022  4:14 PM Franchot Heidelberg wrote: [phone]

## 2022-03-27 ENCOUNTER — Encounter: Payer: BC Managed Care – PPO | Admitting: Emergency Medicine

## 2022-03-27 ENCOUNTER — Encounter: Payer: Self-pay | Admitting: Family Medicine

## 2022-03-27 ENCOUNTER — Ambulatory Visit (INDEPENDENT_AMBULATORY_CARE_PROVIDER_SITE_OTHER): Payer: BC Managed Care – PPO | Admitting: Family Medicine

## 2022-03-27 VITALS — BP 112/78 | HR 84 | Temp 98.1°F | Resp 16 | Wt 182.2 lb

## 2022-03-27 DIAGNOSIS — I10 Essential (primary) hypertension: Secondary | ICD-10-CM

## 2022-03-27 DIAGNOSIS — Z130 Encounter for screening for diseases of the blood and blood-forming organs and certain disorders involving the immune mechanism: Secondary | ICD-10-CM | POA: Diagnosis not present

## 2022-03-27 DIAGNOSIS — Z0000 Encounter for general adult medical examination without abnormal findings: Secondary | ICD-10-CM

## 2022-03-27 DIAGNOSIS — Z13220 Encounter for screening for lipoid disorders: Secondary | ICD-10-CM | POA: Diagnosis not present

## 2022-03-27 DIAGNOSIS — E559 Vitamin D deficiency, unspecified: Secondary | ICD-10-CM | POA: Diagnosis not present

## 2022-03-27 MED ORDER — METOPROLOL SUCCINATE ER 25 MG PO TB24: 25.0000 mg | ORAL_TABLET | Freq: Every day | ORAL | 0 refills | Status: DC

## 2022-03-28 ENCOUNTER — Other Ambulatory Visit: Payer: Self-pay | Admitting: Family Medicine

## 2022-03-28 DIAGNOSIS — Z1389 Encounter for screening for other disorder: Secondary | ICD-10-CM | POA: Diagnosis not present

## 2022-03-28 DIAGNOSIS — N898 Other specified noninflammatory disorders of vagina: Secondary | ICD-10-CM | POA: Diagnosis not present

## 2022-03-28 DIAGNOSIS — Z01419 Encounter for gynecological examination (general) (routine) without abnormal findings: Secondary | ICD-10-CM | POA: Diagnosis not present

## 2022-03-28 DIAGNOSIS — Z1231 Encounter for screening mammogram for malignant neoplasm of breast: Secondary | ICD-10-CM | POA: Diagnosis not present

## 2022-03-28 LAB — CBC WITH DIFFERENTIAL/PLATELET
Basophils Absolute: 0 10*3/uL (ref 0.0–0.2)
Basos: 0 %
EOS (ABSOLUTE): 0.3 10*3/uL (ref 0.0–0.4)
Eos: 4 %
Hematocrit: 44.1 % (ref 34.0–46.6)
Hemoglobin: 14.3 g/dL (ref 11.1–15.9)
Immature Grans (Abs): 0 10*3/uL (ref 0.0–0.1)
Immature Granulocytes: 0 %
Lymphocytes Absolute: 3.9 10*3/uL — ABNORMAL HIGH (ref 0.7–3.1)
Lymphs: 43 %
MCH: 28.5 pg (ref 26.6–33.0)
MCHC: 32.4 g/dL (ref 31.5–35.7)
MCV: 88 fL (ref 79–97)
Monocytes Absolute: 0.6 10*3/uL (ref 0.1–0.9)
Monocytes: 7 %
Neutrophils Absolute: 4.2 10*3/uL (ref 1.4–7.0)
Neutrophils: 46 %
Platelets: 331 10*3/uL (ref 150–450)
RBC: 5.01 x10E6/uL (ref 3.77–5.28)
RDW: 12.9 % (ref 11.7–15.4)
WBC: 9.1 10*3/uL (ref 3.4–10.8)

## 2022-03-28 LAB — CMP14+EGFR
ALT: 15 IU/L (ref 0–32)
AST: 14 IU/L (ref 0–40)
Albumin/Globulin Ratio: 1.4 (ref 1.2–2.2)
Albumin: 4.6 g/dL (ref 3.9–4.9)
Alkaline Phosphatase: 109 IU/L (ref 44–121)
BUN/Creatinine Ratio: 17 (ref 9–23)
BUN: 13 mg/dL (ref 6–24)
Bilirubin Total: 0.6 mg/dL (ref 0.0–1.2)
CO2: 24 mmol/L (ref 20–29)
Calcium: 10.1 mg/dL (ref 8.7–10.2)
Chloride: 100 mmol/L (ref 96–106)
Creatinine, Ser: 0.76 mg/dL (ref 0.57–1.00)
Globulin, Total: 3.2 g/dL (ref 1.5–4.5)
Glucose: 83 mg/dL (ref 70–99)
Potassium: 4.3 mmol/L (ref 3.5–5.2)
Sodium: 139 mmol/L (ref 134–144)
Total Protein: 7.8 g/dL (ref 6.0–8.5)
eGFR: 95 mL/min/{1.73_m2} (ref >59)

## 2022-03-28 LAB — LIPID PANEL
Chol/HDL Ratio: 2.4 ratio (ref 0.0–4.4)
Cholesterol, Total: 140 mg/dL (ref 100–199)
HDL: 58 mg/dL (ref >39)
LDL Chol Calc (NIH): 62 mg/dL (ref 0–99)
Triglycerides: 112 mg/dL (ref 0–149)
VLDL Cholesterol Cal: 20 mg/dL (ref 5–40)

## 2022-03-28 LAB — VITAMIN D 25 HYDROXY (VIT D DEFICIENCY, FRACTURES): Vit D, 25-Hydroxy: 26.8 ng/mL — ABNORMAL LOW (ref 30.0–100.0)

## 2022-03-28 MED ORDER — VITAMIN D (ERGOCALCIFEROL) 1.25 MG (50000 UNIT) PO CAPS: 50000.0000 [IU] | ORAL_CAPSULE | ORAL | 0 refills | Status: DC

## 2022-03-28 NOTE — Progress Notes (Signed)
Established Patient Office Visit  Subjective    Patient ID: Cathy Cole, female    DOB: Jan 01, 1971  Age: 52 y.o. MRN: [id number]  CC:  Chief Complaint  Patient presents with   Annual Exam    HPI Cathy Cole presents for routine annual exam. Denies acute complaints or concerns.    Outpatient Encounter Medications as of 03/27/2022  Medication Sig   acetaminophen (TYLENOL) 500 MG tablet Take 1,000 mg by mouth every 6 (six) hours as needed for mild pain, moderate pain or headache.   clonazePAM (KLONOPIN) 0.5 MG tablet Take 1 tablet (0.5 mg total) by mouth 2 (two) times daily as needed.   [DISCONTINUED] metoprolol succinate (TOPROL-XL) 25 MG 24 hr tablet TAKE 1 TABLET (25 MG TOTAL) BY MOUTH DAILY.   metoprolol succinate (TOPROL-XL) 25 MG 24 hr tablet Take 1 tablet (25 mg total) by mouth daily.   valsartan-hydrochlorothiazide (DIOVAN HCT) 80-12.5 MG tablet Take 1 tablet by mouth daily.   [DISCONTINUED] cyclobenzaprine (FLEXERIL) 10 MG tablet Take 1 tablet (10 mg total) by mouth 2 (two) times daily as needed for muscle spasms.   [DISCONTINUED] SUMAtriptan (IMITREX) 50 MG tablet    No facility-administered encounter medications on file as of 03/27/2022.    Past Medical History:  Diagnosis Date   Abnormal uterine bleeding (AUB) 11/07/2014   Allergy    Anxiety    Hypertension    Menorrhagia 11/07/2014    Past Surgical History:  Procedure Laterality Date   BUNIONECTOMY     COSMETIC SURGERY     abdominoplasty   EAR CYST EXCISION Right 2021   tubes left ear   NOVASURE ABLATION N/A 11/08/2014   Procedure: NOVASURE ABLATION;  Surgeon: Janyth Contes, MD;  Location: [facility];  Service: Gynecology;  Laterality: N/A;   TUBAL LIGATION      Family History  Problem Relation Age of Onset   Hypertension Mother    Diabetes Maternal Grandmother    Hypertension Maternal Grandmother    Diabetes Other    Hypertension Other    Colon cancer Neg Hx    Colon polyps Neg Hx     Esophageal cancer Neg Hx    Rectal cancer Neg Hx    Stomach cancer Neg Hx     Social History   Socioeconomic History   Marital status: Divorced    Spouse name: Not on file   Number of children: 3   Years of education: Not on file   Highest education level: Not on file  Occupational History   Not on file  Tobacco Use   Smoking status: Never   Smokeless tobacco: Never  Vaping Use   Vaping Use: Never used  Substance and Sexual Activity   Alcohol use: No   Drug use: Never   Sexual activity: Yes  Other Topics Concern   Not on file  Social History Narrative   Not on file   Social Determinants of Health   Financial Resource Strain: Not on file  Food Insecurity: Not on file  Transportation Needs: Not on file  Physical Activity: Not on file  Stress: Not on file  Social Connections: Not on file  Intimate Partner Violence: Not on file    Review of Systems  All other systems reviewed and are negative.       Objective    BP 112/78   Pulse 84   Temp 98.1 F (36.7 C) (Oral)   Resp 16   Wt 182 lb 3.2 oz (82.6 kg)  LMP  (LMP Unknown)   SpO2 97%   BMI 31.27 kg/m   Physical Exam Vitals and nursing note reviewed.  Constitutional:      General: She is not in acute distress. HENT:     Head: Normocephalic and atraumatic.     Right Ear: Tympanic membrane, ear canal and external ear normal.     Left Ear: Tympanic membrane, ear canal and external ear normal.     Nose: Nose normal.     Mouth/Throat:     Mouth: Mucous membranes are moist.     Pharynx: Oropharynx is clear.  Eyes:     Conjunctiva/sclera: Conjunctivae normal.     Pupils: Pupils are equal, round, and reactive to light.  Neck:     Thyroid: No thyromegaly.  Cardiovascular:     Rate and Rhythm: Normal rate and regular rhythm.     Heart sounds: Normal heart sounds. No murmur heard. Pulmonary:     Effort: Pulmonary effort is normal. No respiratory distress.     Breath sounds: Normal breath sounds.   Abdominal:     General: There is no distension.     Palpations: Abdomen is soft. There is no mass.     Tenderness: There is no abdominal tenderness.  Musculoskeletal:        General: Normal range of motion.     Cervical back: Normal range of motion and neck supple.  Skin:    General: Skin is warm and dry.  Neurological:     General: No focal deficit present.     Mental Status: She is alert and oriented to person, place, and time.  Psychiatric:        Mood and Affect: Mood normal.        Behavior: Behavior normal.         Assessment & Plan:   1. Annual physical exam  - CMP14+EGFR  2. Screening for deficiency anemia  - CBC with Differential  3. Screening for lipid disorders  - Lipid Panel  4. Vitamin D deficiency  - Vitamin D, 25-hydroxy     Return in about 6 months (around 09/25/2022) for follow up.   Becky Sax, MD

## 2022-04-01 DIAGNOSIS — D259 Leiomyoma of uterus, unspecified: Secondary | ICD-10-CM | POA: Diagnosis not present

## 2022-04-01 DIAGNOSIS — M6208 Separation of muscle (nontraumatic), other site: Secondary | ICD-10-CM | POA: Diagnosis not present

## 2022-04-01 DIAGNOSIS — N904 Leukoplakia of vulva: Secondary | ICD-10-CM | POA: Diagnosis not present

## 2022-04-01 DIAGNOSIS — N812 Incomplete uterovaginal prolapse: Secondary | ICD-10-CM | POA: Diagnosis not present

## 2022-04-01 DIAGNOSIS — N814 Uterovaginal prolapse, unspecified: Secondary | ICD-10-CM | POA: Diagnosis not present

## 2022-04-01 DIAGNOSIS — N9089 Other specified noninflammatory disorders of vulva and perineum: Secondary | ICD-10-CM | POA: Diagnosis not present

## 2022-04-01 DIAGNOSIS — N888 Other specified noninflammatory disorders of cervix uteri: Secondary | ICD-10-CM | POA: Diagnosis not present

## 2022-04-01 DIAGNOSIS — B078 Other viral warts: Secondary | ICD-10-CM | POA: Diagnosis not present

## 2022-04-01 DIAGNOSIS — N393 Stress incontinence (female) (male): Secondary | ICD-10-CM | POA: Diagnosis not present

## 2022-04-02 DIAGNOSIS — N9089 Other specified noninflammatory disorders of vulva and perineum: Secondary | ICD-10-CM | POA: Diagnosis not present

## 2022-04-02 DIAGNOSIS — N812 Incomplete uterovaginal prolapse: Secondary | ICD-10-CM | POA: Diagnosis not present

## 2022-04-02 DIAGNOSIS — N814 Uterovaginal prolapse, unspecified: Secondary | ICD-10-CM | POA: Diagnosis not present

## 2022-04-02 DIAGNOSIS — M6208 Separation of muscle (nontraumatic), other site: Secondary | ICD-10-CM | POA: Diagnosis not present

## 2022-04-02 DIAGNOSIS — N393 Stress incontinence (female) (male): Secondary | ICD-10-CM | POA: Diagnosis not present

## 2022-04-13 ENCOUNTER — Other Ambulatory Visit: Payer: Self-pay | Admitting: Emergency Medicine

## 2022-04-13 DIAGNOSIS — I10 Essential (primary) hypertension: Secondary | ICD-10-CM

## 2022-04-15 DIAGNOSIS — M79605 Pain in left leg: Secondary | ICD-10-CM | POA: Diagnosis not present

## 2022-04-15 DIAGNOSIS — N8189 Other female genital prolapse: Secondary | ICD-10-CM | POA: Diagnosis not present

## 2022-04-17 ENCOUNTER — Other Ambulatory Visit: Payer: Self-pay | Admitting: Family Medicine

## 2022-04-21 ENCOUNTER — Other Ambulatory Visit: Payer: Self-pay | Admitting: Family Medicine

## 2022-04-22 ENCOUNTER — Other Ambulatory Visit: Payer: Self-pay | Admitting: Family Medicine

## 2022-04-22 MED ORDER — CLONAZEPAM 0.5 MG PO TABS: 0.5000 mg | ORAL_TABLET | Freq: Two times a day (BID) | ORAL | 0 refills | Status: DC | PRN

## 2022-05-01 ENCOUNTER — Other Ambulatory Visit: Payer: Self-pay | Admitting: Family Medicine

## 2022-05-01 DIAGNOSIS — N393 Stress incontinence (female) (male): Secondary | ICD-10-CM | POA: Diagnosis not present

## 2022-05-09 ENCOUNTER — Ambulatory Visit: Payer: BC Managed Care – PPO | Admitting: Family Medicine

## 2022-05-13 DIAGNOSIS — H90A31 Mixed conductive and sensorineural hearing loss, unilateral, right ear with restricted hearing on the contralateral side: Secondary | ICD-10-CM | POA: Diagnosis not present

## 2022-05-13 DIAGNOSIS — H90A22 Sensorineural hearing loss, unilateral, left ear, with restricted hearing on the contralateral side: Secondary | ICD-10-CM | POA: Diagnosis not present

## 2022-05-13 DIAGNOSIS — Z8669 Personal history of other diseases of the nervous system and sense organs: Secondary | ICD-10-CM | POA: Diagnosis not present

## 2022-05-13 DIAGNOSIS — H93292 Other abnormal auditory perceptions, left ear: Secondary | ICD-10-CM | POA: Diagnosis not present

## 2022-05-19 DIAGNOSIS — K760 Fatty (change of) liver, not elsewhere classified: Secondary | ICD-10-CM | POA: Diagnosis not present

## 2022-05-23 DIAGNOSIS — H9122 Sudden idiopathic hearing loss, left ear: Secondary | ICD-10-CM | POA: Diagnosis not present

## 2022-05-23 DIAGNOSIS — H90A22 Sensorineural hearing loss, unilateral, left ear, with restricted hearing on the contralateral side: Secondary | ICD-10-CM | POA: Diagnosis not present

## 2022-05-23 DIAGNOSIS — H90A31 Mixed conductive and sensorineural hearing loss, unilateral, right ear with restricted hearing on the contralateral side: Secondary | ICD-10-CM | POA: Diagnosis not present

## 2022-05-23 DIAGNOSIS — H93292 Other abnormal auditory perceptions, left ear: Secondary | ICD-10-CM | POA: Diagnosis not present

## 2022-05-24 DIAGNOSIS — R319 Hematuria, unspecified: Secondary | ICD-10-CM | POA: Diagnosis not present

## 2022-05-24 DIAGNOSIS — M5441 Lumbago with sciatica, right side: Secondary | ICD-10-CM | POA: Diagnosis not present

## 2022-05-27 DIAGNOSIS — H90A31 Mixed conductive and sensorineural hearing loss, unilateral, right ear with restricted hearing on the contralateral side: Secondary | ICD-10-CM | POA: Diagnosis not present

## 2022-05-27 DIAGNOSIS — H90A22 Sensorineural hearing loss, unilateral, left ear, with restricted hearing on the contralateral side: Secondary | ICD-10-CM | POA: Diagnosis not present

## 2022-05-29 DIAGNOSIS — H9192 Unspecified hearing loss, left ear: Secondary | ICD-10-CM | POA: Diagnosis not present

## 2022-05-29 DIAGNOSIS — H9122 Sudden idiopathic hearing loss, left ear: Secondary | ICD-10-CM | POA: Diagnosis not present

## 2022-06-06 DIAGNOSIS — H90A22 Sensorineural hearing loss, unilateral, left ear, with restricted hearing on the contralateral side: Secondary | ICD-10-CM | POA: Diagnosis not present

## 2022-06-06 DIAGNOSIS — H90A31 Mixed conductive and sensorineural hearing loss, unilateral, right ear with restricted hearing on the contralateral side: Secondary | ICD-10-CM | POA: Diagnosis not present

## 2022-06-10 DIAGNOSIS — R350 Frequency of micturition: Secondary | ICD-10-CM | POA: Diagnosis not present

## 2022-06-10 DIAGNOSIS — N898 Other specified noninflammatory disorders of vagina: Secondary | ICD-10-CM | POA: Diagnosis not present

## 2022-06-10 DIAGNOSIS — R102 Pelvic and perineal pain: Secondary | ICD-10-CM | POA: Diagnosis not present

## 2022-06-10 DIAGNOSIS — L293 Anogenital pruritus, unspecified: Secondary | ICD-10-CM | POA: Diagnosis not present

## 2022-07-03 ENCOUNTER — Other Ambulatory Visit: Payer: Self-pay | Admitting: Family Medicine

## 2022-07-07 ENCOUNTER — Telehealth: Payer: Self-pay | Admitting: Family Medicine

## 2022-07-07 NOTE — Telephone Encounter (Signed)
Copied from CRM [id number]. Topic: General - Other >> Jul 07, 2022  1:52 PM Franchot Heidelberg wrote: Reason for CRM: Pt called back regarding her refill of chlonazepam, says she is completely out and her pharmacy requested this on 07/02/2020.

## 2022-08-06 ENCOUNTER — Other Ambulatory Visit: Payer: Self-pay | Admitting: Family Medicine

## 2022-08-18 ENCOUNTER — Other Ambulatory Visit: Payer: Self-pay | Admitting: *Deleted

## 2022-08-18 DIAGNOSIS — I10 Essential (primary) hypertension: Secondary | ICD-10-CM

## 2022-08-18 MED ORDER — METOPROLOL SUCCINATE ER 25 MG PO TB24: 25.0000 mg | ORAL_TABLET | Freq: Every day | ORAL | 0 refills | Status: DC

## 2022-10-03 ENCOUNTER — Other Ambulatory Visit: Payer: Self-pay | Admitting: Family Medicine

## 2022-11-16 ENCOUNTER — Other Ambulatory Visit: Payer: Self-pay | Admitting: Family Medicine

## 2022-11-16 DIAGNOSIS — I10 Essential (primary) hypertension: Secondary | ICD-10-CM

## 2022-12-01 DIAGNOSIS — N898 Other specified noninflammatory disorders of vagina: Secondary | ICD-10-CM | POA: Diagnosis not present

## 2022-12-04 DIAGNOSIS — H906 Mixed conductive and sensorineural hearing loss, bilateral: Secondary | ICD-10-CM | POA: Diagnosis not present

## 2022-12-11 DIAGNOSIS — H906 Mixed conductive and sensorineural hearing loss, bilateral: Secondary | ICD-10-CM | POA: Diagnosis not present

## 2022-12-16 ENCOUNTER — Other Ambulatory Visit: Payer: Self-pay | Admitting: Family Medicine

## 2022-12-20 ENCOUNTER — Other Ambulatory Visit: Payer: Self-pay | Admitting: Family Medicine

## 2022-12-20 DIAGNOSIS — I10 Essential (primary) hypertension: Secondary | ICD-10-CM

## 2022-12-22 ENCOUNTER — Telehealth: Payer: Self-pay | Admitting: *Deleted

## 2022-12-23 ENCOUNTER — Other Ambulatory Visit: Payer: Self-pay | Admitting: Family Medicine

## 2022-12-23 DIAGNOSIS — I10 Essential (primary) hypertension: Secondary | ICD-10-CM

## 2022-12-23 MED ORDER — METOPROLOL SUCCINATE ER 25 MG PO TB24: 25.0000 mg | ORAL_TABLET | Freq: Every day | ORAL | 0 refills | Status: AC

## 2022-12-23 NOTE — Telephone Encounter (Signed)
Med refill request

## 2022-12-23 NOTE — Telephone Encounter (Signed)
Requested medication (s) are due for refill today: Yes  Requested medication (s) are on the active medication list: Yes  Last refill:  11/18/22  Future visit scheduled: Yes  Notes to clinic:  Pt. Has appointment. See pharmacy request.    Requested Prescriptions  Pending Prescriptions Disp Refills   metoprolol succinate (TOPROL-XL) 25 MG 24 hr tablet [Pharmacy Med Name: METOPROLOL SUCC ER 25 MG TAB] 90 tablet 1    Sig: TAKE 1 TABLET (25 MG TOTAL) BY MOUTH DAILY.     Cardiovascular:  Beta Blockers Failed - 12/20/2022  1:31 PM      Failed - Valid encounter within last 6 months    Recent Outpatient Visits           9 months ago Annual physical exam   [facility], MD   10 months ago Abdominal pain, LUQ   [place] [facility], MD   1 year ago Essential hypertension   [place] [facility], MD   1 year ago Left knee pain, unspecified chronicity   [place] [facility], MD   2 years ago Essential hypertension   [place] [facility], MD       Future Appointments             In 3 weeks Georganna Skeans, MD [facility] - Last BP in normal range    BP Readings from Last 1 Encounters:  03/27/22 112/78         Passed - Last Heart Rate in normal range    Pulse Readings from Last 1 Encounters:  03/27/22 84

## 2023-01-02 DIAGNOSIS — H903 Sensorineural hearing loss, bilateral: Secondary | ICD-10-CM | POA: Diagnosis not present

## 2023-01-13 DIAGNOSIS — I10 Essential (primary) hypertension: Secondary | ICD-10-CM | POA: Diagnosis not present

## 2023-01-13 DIAGNOSIS — F411 Generalized anxiety disorder: Secondary | ICD-10-CM | POA: Diagnosis not present

## 2023-01-13 DIAGNOSIS — Z79899 Other long term (current) drug therapy: Secondary | ICD-10-CM | POA: Diagnosis not present

## 2023-01-13 DIAGNOSIS — N951 Menopausal and female climacteric states: Secondary | ICD-10-CM | POA: Diagnosis not present

## 2023-01-16 ENCOUNTER — Ambulatory Visit: Payer: BC Managed Care – PPO | Admitting: Family Medicine

## 2023-01-29 DIAGNOSIS — M7552 Bursitis of left shoulder: Secondary | ICD-10-CM | POA: Diagnosis not present

## 2023-02-18 DIAGNOSIS — N898 Other specified noninflammatory disorders of vagina: Secondary | ICD-10-CM | POA: Diagnosis not present

## 2023-03-06 DIAGNOSIS — H6983 Other specified disorders of Eustachian tube, bilateral: Secondary | ICD-10-CM | POA: Diagnosis not present

## 2023-03-06 DIAGNOSIS — H73892 Other specified disorders of tympanic membrane, left ear: Secondary | ICD-10-CM | POA: Diagnosis not present
